# Patient Record
Sex: Male | Born: 1960 | Race: Black or African American | Hispanic: No | Smoking: Never smoker
Health system: Southern US, Community
[De-identification: ages and names within clinical notes are randomized; demographics above are authoritative.]

## PROBLEM LIST (undated history)

## (undated) DIAGNOSIS — J45909 Unspecified asthma, uncomplicated: Secondary | ICD-10-CM

---

## 2021-01-23 ENCOUNTER — Inpatient Hospital Stay (HOSPITAL_COMMUNITY)
Admission: EM | Admit: 2021-01-23 | Discharge: 2021-01-28 | DRG: 065 | Disposition: A | Discharge status: Home / Self Care | Payer: Self-pay | Attending: Family Medicine | Admitting: Family Medicine

## 2021-01-23 ENCOUNTER — Inpatient Hospital Stay (HOSPITAL_COMMUNITY): Payer: Self-pay

## 2021-01-23 ENCOUNTER — Other Ambulatory Visit: Payer: Self-pay

## 2021-01-23 ENCOUNTER — Emergency Department (HOSPITAL_COMMUNITY): Payer: Self-pay

## 2021-01-23 DIAGNOSIS — E785 Hyperlipidemia, unspecified: Secondary | ICD-10-CM | POA: Diagnosis present

## 2021-01-23 DIAGNOSIS — I255 Ischemic cardiomyopathy: Secondary | ICD-10-CM | POA: Diagnosis present

## 2021-01-23 DIAGNOSIS — I63412 Cerebral infarction due to embolism of left middle cerebral artery: Principal | ICD-10-CM | POA: Diagnosis present

## 2021-01-23 DIAGNOSIS — R29810 Facial weakness: Secondary | ICD-10-CM | POA: Diagnosis present

## 2021-01-23 DIAGNOSIS — J45909 Unspecified asthma, uncomplicated: Secondary | ICD-10-CM | POA: Diagnosis present

## 2021-01-23 DIAGNOSIS — I2510 Atherosclerotic heart disease of native coronary artery without angina pectoris: Secondary | ICD-10-CM | POA: Diagnosis present

## 2021-01-23 DIAGNOSIS — I6389 Other cerebral infarction: Secondary | ICD-10-CM

## 2021-01-23 DIAGNOSIS — R29712 NIHSS score 12: Secondary | ICD-10-CM | POA: Diagnosis present

## 2021-01-23 DIAGNOSIS — I10 Essential (primary) hypertension: Secondary | ICD-10-CM | POA: Diagnosis present

## 2021-01-23 DIAGNOSIS — R54 Age-related physical debility: Secondary | ICD-10-CM | POA: Diagnosis present

## 2021-01-23 DIAGNOSIS — I110 Hypertensive heart disease with heart failure: Secondary | ICD-10-CM | POA: Diagnosis present

## 2021-01-23 DIAGNOSIS — Z91018 Allergy to other foods: Secondary | ICD-10-CM

## 2021-01-23 DIAGNOSIS — D72819 Decreased white blood cell count, unspecified: Secondary | ICD-10-CM

## 2021-01-23 DIAGNOSIS — F1020 Alcohol dependence, uncomplicated: Secondary | ICD-10-CM | POA: Diagnosis present

## 2021-01-23 DIAGNOSIS — R471 Dysarthria and anarthria: Secondary | ICD-10-CM | POA: Diagnosis present

## 2021-01-23 DIAGNOSIS — I5040 Unspecified combined systolic (congestive) and diastolic (congestive) heart failure: Secondary | ICD-10-CM | POA: Diagnosis present

## 2021-01-23 DIAGNOSIS — R739 Hyperglycemia, unspecified: Secondary | ICD-10-CM | POA: Diagnosis present

## 2021-01-23 DIAGNOSIS — I639 Cerebral infarction, unspecified: Secondary | ICD-10-CM | POA: Diagnosis present

## 2021-01-23 DIAGNOSIS — I252 Old myocardial infarction: Secondary | ICD-10-CM

## 2021-01-23 DIAGNOSIS — R030 Elevated blood-pressure reading, without diagnosis of hypertension: Secondary | ICD-10-CM

## 2021-01-23 DIAGNOSIS — F1010 Alcohol abuse, uncomplicated: Secondary | ICD-10-CM | POA: Diagnosis present

## 2021-01-23 DIAGNOSIS — I5042 Chronic combined systolic (congestive) and diastolic (congestive) heart failure: Secondary | ICD-10-CM | POA: Diagnosis present

## 2021-01-23 DIAGNOSIS — E871 Hypo-osmolality and hyponatremia: Secondary | ICD-10-CM | POA: Diagnosis present

## 2021-01-23 DIAGNOSIS — I700 Atherosclerosis of aorta: Secondary | ICD-10-CM | POA: Diagnosis present

## 2021-01-23 DIAGNOSIS — Z20822 Contact with and (suspected) exposure to covid-19: Secondary | ICD-10-CM | POA: Diagnosis present

## 2021-01-23 DIAGNOSIS — I513 Intracardiac thrombosis, not elsewhere classified: Secondary | ICD-10-CM | POA: Diagnosis present

## 2021-01-23 DIAGNOSIS — Y902 Blood alcohol level of 40-59 mg/100 ml: Secondary | ICD-10-CM | POA: Diagnosis present

## 2021-01-23 DIAGNOSIS — Z9119 Patient's noncompliance with other medical treatment and regimen: Secondary | ICD-10-CM

## 2021-01-23 DIAGNOSIS — R4701 Aphasia: Secondary | ICD-10-CM | POA: Diagnosis present

## 2021-01-23 HISTORY — DX: Unspecified asthma, uncomplicated: J45.909

## 2021-01-23 LAB — DIFFERENTIAL
Abs Immature Granulocytes: 0.01 10*3/uL (ref 0.00–0.07)
Basophils Absolute: 0 10*3/uL (ref 0.0–0.1)
Basophils Relative: 1 %
Eosinophils Absolute: 0 10*3/uL (ref 0.0–0.5)
Eosinophils Relative: 1 %
Immature Granulocytes: 0 %
Lymphocytes Relative: 22 %
Lymphs Abs: 0.7 10*3/uL (ref 0.7–4.0)
Monocytes Absolute: 0.4 10*3/uL (ref 0.1–1.0)
Monocytes Relative: 13 %
Neutro Abs: 2.1 10*3/uL (ref 1.7–7.7)
Neutrophils Relative %: 63 %

## 2021-01-23 LAB — CBC
HCT: 44.3 % (ref 39.0–52.0)
Hemoglobin: 14.5 g/dL (ref 13.0–17.0)
MCH: 25.8 pg — ABNORMAL LOW (ref 26.0–34.0)
MCHC: 32.7 g/dL (ref 30.0–36.0)
MCV: 79 fL — ABNORMAL LOW (ref 80.0–100.0)
Platelets: 154 10*3/uL (ref 150–400)
RBC: 5.61 MIL/uL (ref 4.22–5.81)
RDW: 14.8 % (ref 11.5–15.5)
WBC: 3.3 10*3/uL — ABNORMAL LOW (ref 4.0–10.5)
nRBC: 0 % (ref 0.0–0.2)

## 2021-01-23 LAB — COMPREHENSIVE METABOLIC PANEL
ALT: 18 U/L (ref 0–44)
AST: 34 U/L (ref 15–41)
Albumin: 3.6 g/dL (ref 3.5–5.0)
Alkaline Phosphatase: 71 U/L (ref 38–126)
Anion gap: 13 (ref 5–15)
BUN: 14 mg/dL (ref 6–20)
CO2: 20 mmol/L — ABNORMAL LOW (ref 22–32)
Calcium: 8.8 mg/dL — ABNORMAL LOW (ref 8.9–10.3)
Chloride: 100 mmol/L (ref 98–111)
Creatinine, Ser: 1.09 mg/dL (ref 0.61–1.24)
GFR, Estimated: >60 mL/min (ref >60)
Glucose, Bld: 116 mg/dL — ABNORMAL HIGH (ref 70–99)
Potassium: 4.1 mmol/L (ref 3.5–5.1)
Sodium: 133 mmol/L — ABNORMAL LOW (ref 135–145)
Total Bilirubin: 0.3 mg/dL (ref 0.3–1.2)
Total Protein: 7.7 g/dL (ref 6.5–8.1)

## 2021-01-23 LAB — RESP PANEL BY RT-PCR (FLU A&B, COVID) ARPGX2
Influenza A by PCR: NEGATIVE
Influenza B by PCR: NEGATIVE
SARS Coronavirus 2 by RT PCR: NEGATIVE

## 2021-01-23 LAB — LIPID PANEL
Cholesterol: 166 mg/dL (ref 0–200)
HDL: 68 mg/dL (ref >40)
LDL Cholesterol: 73 mg/dL (ref 0–99)
Total CHOL/HDL Ratio: 2.4 RATIO
Triglycerides: 123 mg/dL (ref <150)
VLDL: 25 mg/dL (ref 0–40)

## 2021-01-23 LAB — ECHOCARDIOGRAM COMPLETE
S' Lateral: 5.5 cm
Single Plane A4C EF: 21.5 %
Weight: 2130.53 oz

## 2021-01-23 LAB — APTT: aPTT: 25 seconds (ref 24–36)

## 2021-01-23 LAB — URINALYSIS, ROUTINE W REFLEX MICROSCOPIC
Bacteria, UA: NONE SEEN
Bilirubin Urine: NEGATIVE
Glucose, UA: NEGATIVE mg/dL
Ketones, ur: NEGATIVE mg/dL
Leukocytes,Ua: NEGATIVE
Nitrite: NEGATIVE
Protein, ur: NEGATIVE mg/dL
Specific Gravity, Urine: 1.025 (ref 1.005–1.030)
pH: 6 (ref 5.0–8.0)

## 2021-01-23 LAB — I-STAT CHEM 8, ED
BUN: 16 mg/dL (ref 6–20)
Calcium, Ion: 1.02 mmol/L — ABNORMAL LOW (ref 1.15–1.40)
Chloride: 103 mmol/L (ref 98–111)
Creatinine, Ser: 1 mg/dL (ref 0.61–1.24)
Glucose, Bld: 112 mg/dL — ABNORMAL HIGH (ref 70–99)
HCT: 50 % (ref 39.0–52.0)
Hemoglobin: 17 g/dL (ref 13.0–17.0)
Potassium: 4.1 mmol/L (ref 3.5–5.1)
Sodium: 135 mmol/L (ref 135–145)
TCO2: 20 mmol/L — ABNORMAL LOW (ref 22–32)

## 2021-01-23 LAB — VITAMIN B12: Vitamin B-12: 157 pg/mL — ABNORMAL LOW (ref 180–914)

## 2021-01-23 LAB — RAPID URINE DRUG SCREEN, HOSP PERFORMED
Amphetamines: NOT DETECTED
Barbiturates: NOT DETECTED
Benzodiazepines: NOT DETECTED
Cocaine: NOT DETECTED
Opiates: NOT DETECTED
Tetrahydrocannabinol: NOT DETECTED

## 2021-01-23 LAB — ETHANOL: Alcohol, Ethyl (B): 42 mg/dL — ABNORMAL HIGH (ref <10)

## 2021-01-23 LAB — PROTIME-INR
INR: 1.1 (ref 0.8–1.2)
Prothrombin Time: 14.2 seconds (ref 11.4–15.2)

## 2021-01-23 LAB — HIV ANTIBODY (ROUTINE TESTING W REFLEX): HIV Screen 4th Generation wRfx: NONREACTIVE

## 2021-01-23 LAB — HEMOGLOBIN A1C
Hgb A1c MFr Bld: 5.6 % (ref 4.8–5.6)
Mean Plasma Glucose: 114.02 mg/dL

## 2021-01-23 LAB — PHOSPHORUS: Phosphorus: 3.2 mg/dL (ref 2.5–4.6)

## 2021-01-23 LAB — TSH: TSH: 1.221 u[IU]/mL (ref 0.350–4.500)

## 2021-01-23 LAB — MAGNESIUM: Magnesium: 1.5 mg/dL — ABNORMAL LOW (ref 1.7–2.4)

## 2021-01-23 LAB — CBG MONITORING, ED: Glucose-Capillary: 117 mg/dL — ABNORMAL HIGH (ref 70–99)

## 2021-01-23 IMAGING — CT CT HEAD CODE STROKE
4 series · 16 of 47 positions shown, 18 images · non-contrast
Comparison: None.

CLINICAL DATA: Code stroke.  Aphasia and right facial droop.

EXAM:
CT HEAD WITHOUT CONTRAST
TECHNIQUE: Contiguous axial images were obtained from the base of the skull
through the vertex without intravenous contrast.

[Series 2: head 5.0 st · axial · 0.42mm/px · z∈[-133,-13]mm · 7 of 34 slices shown, 9 images]
[im 5/34  brain]
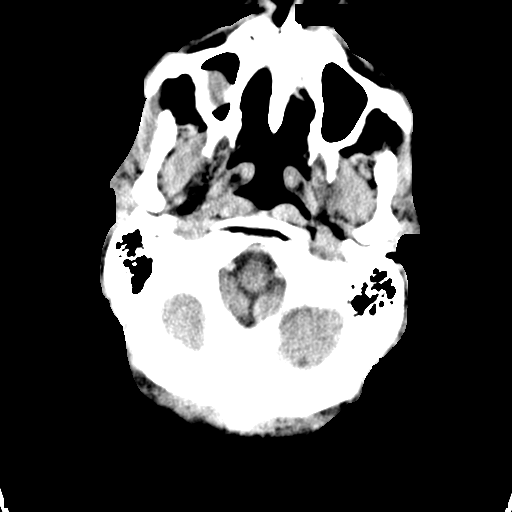
[im 5/34  bone]
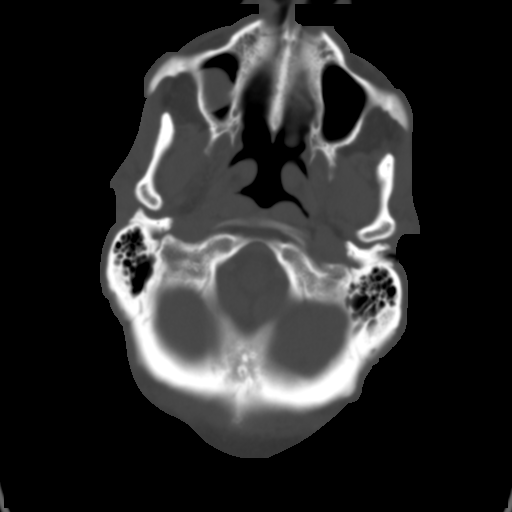
[im 9/34  brain]
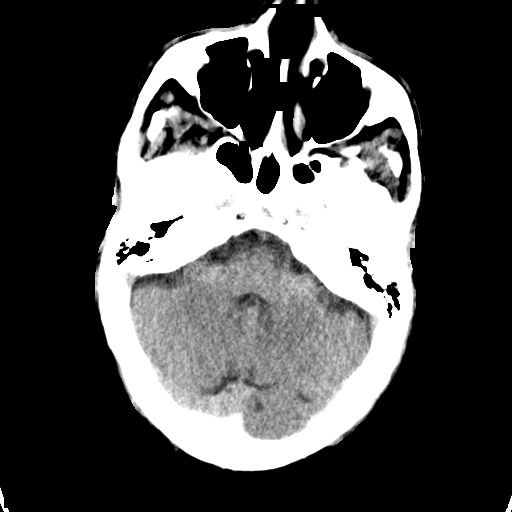
[im 13/34  brain]
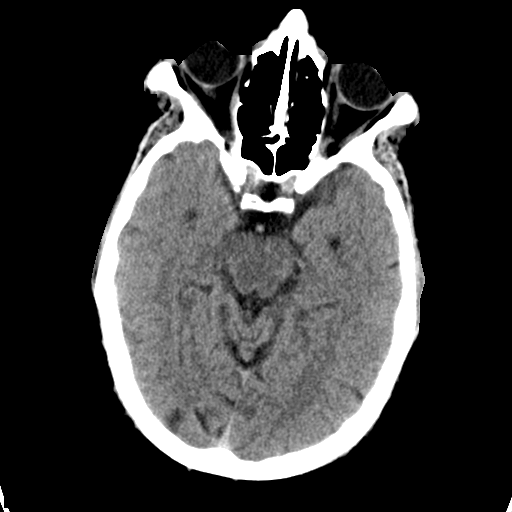
[im 17/34  brain]
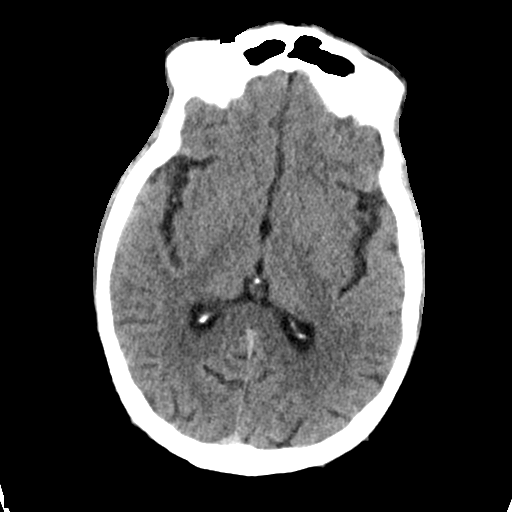
[im 21/34  brain]
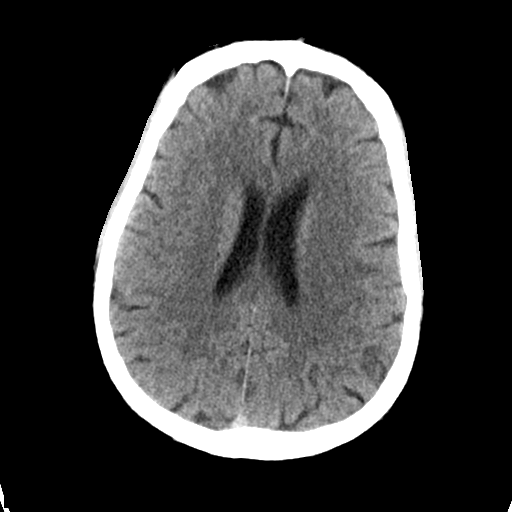
[im 21/34  bone]
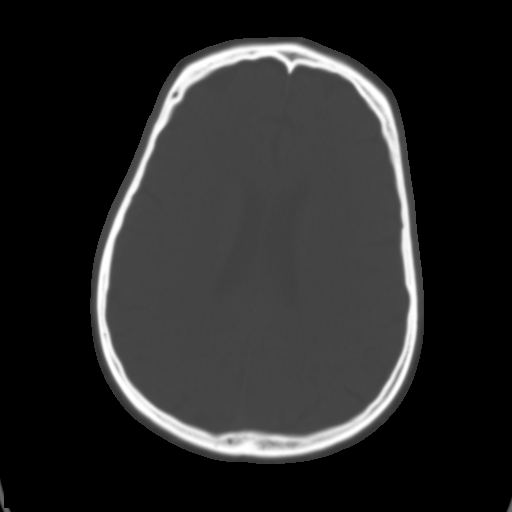
[im 25/34  brain]
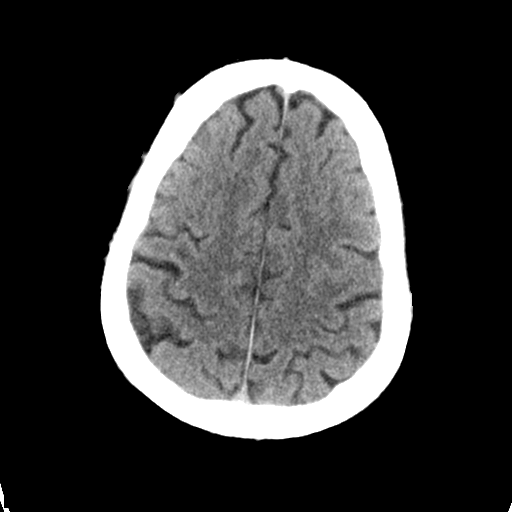
[im 29/34  brain]
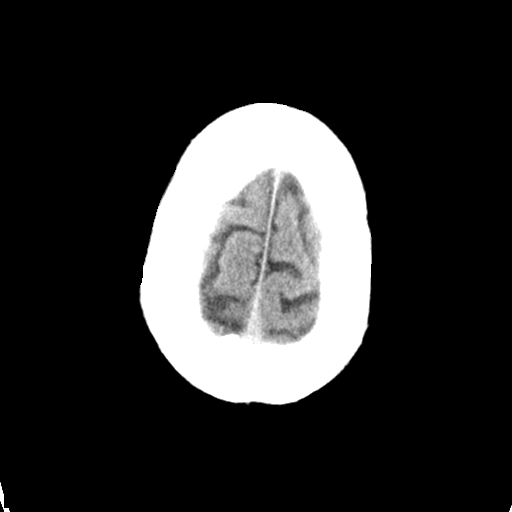

[Series 3: head 2.0 bone · axial · 0.42mm/px · z∈[-137,-103]mm · 3 of 85 slices shown]
[im 9/85  bone]
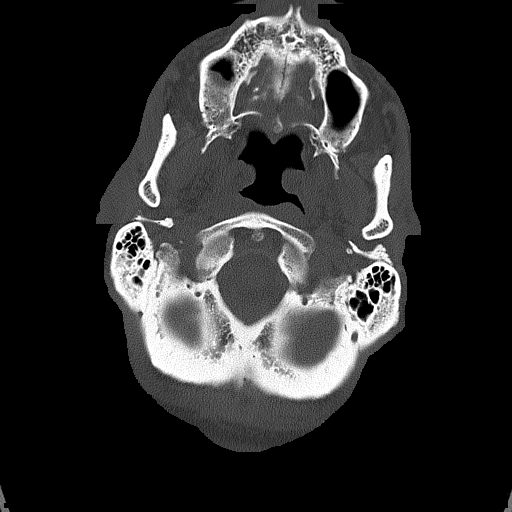
[im 17/85  bone]
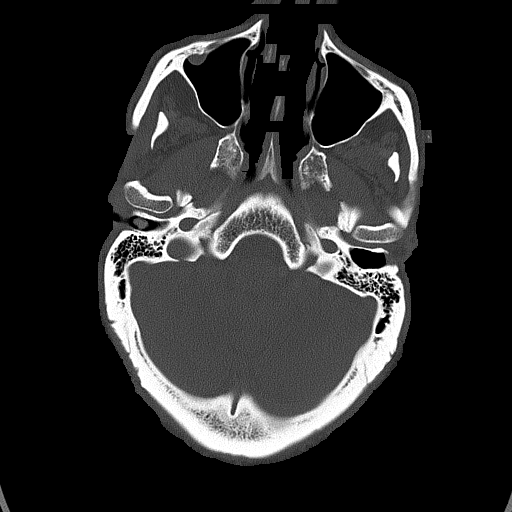
[im 26/85  bone]
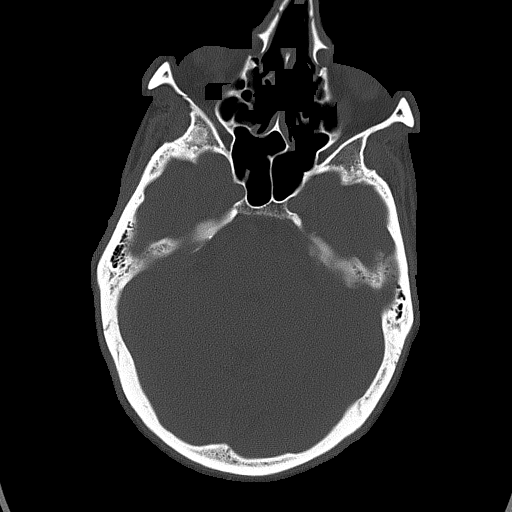

[Series 5: head 3.0 cor st · coronal · 0.33mm/px · 3 of 74 slices shown]
[im 25/74  brain]
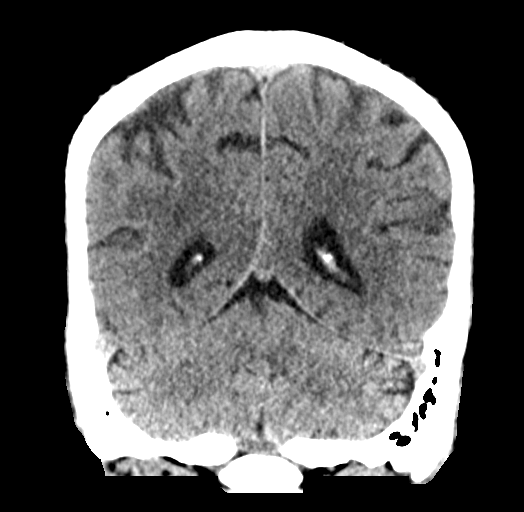
[im 33/74  brain]
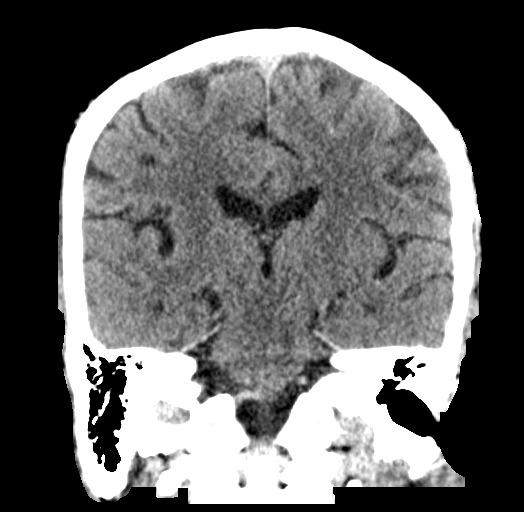
[im 41/74  brain]
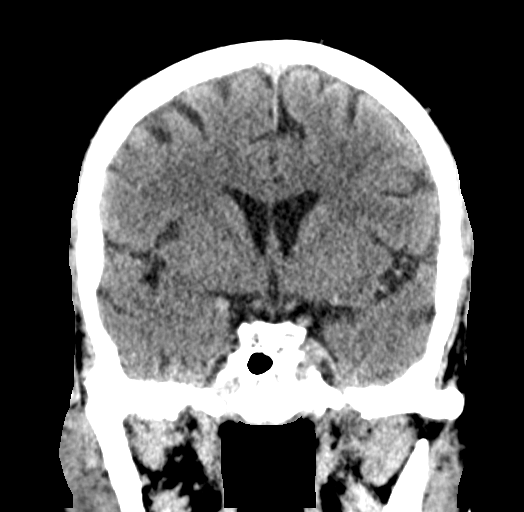

[Series 6: head 3.0 sag st · sagittal · 0.33mm/px · 3 of 60 slices shown]
[im 20/60  brain]
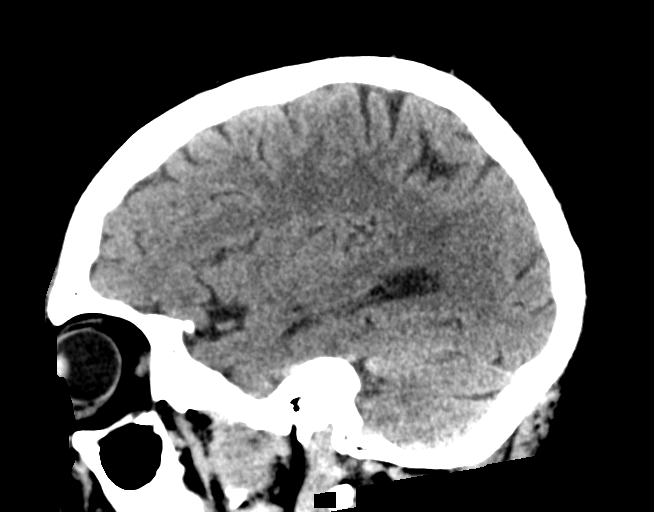
[im 30/60  brain]
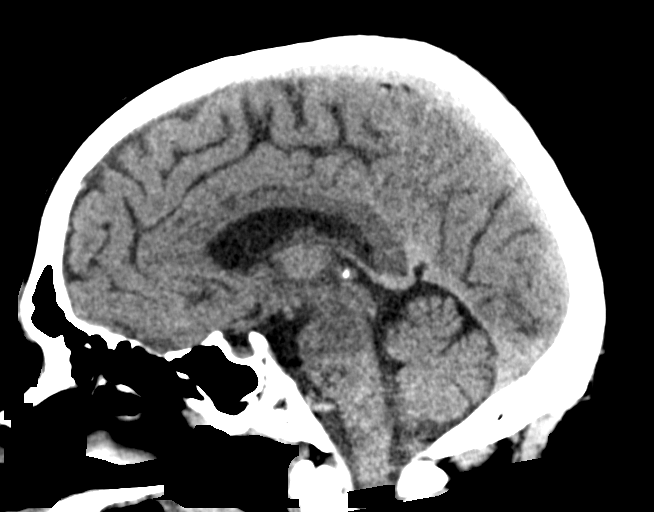
[im 40/60  brain]
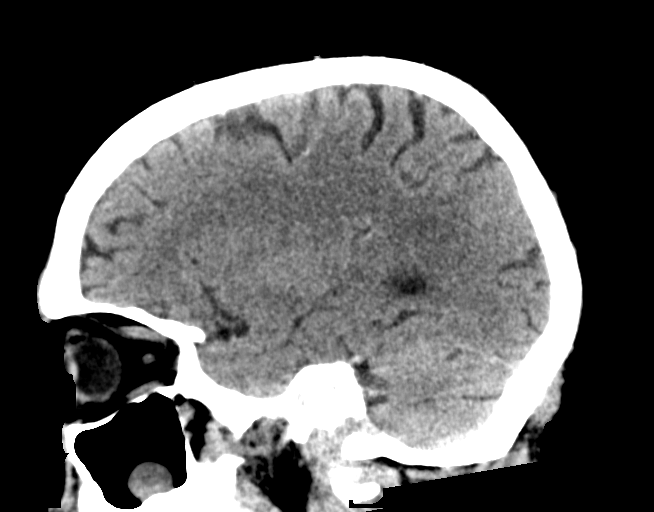

[16 of 47 positions shown; findings below may reference images not displayed]

FINDINGS: Brain: Small area of gray-white matter differentiation loss along
the high and posterior left frontal cortex. No acute hemorrhage,
hydrocephalus, or masslike finding. Small remote left cerebellar
infarct.

Vascular: No definite hyperdense vessel in all planes.

Skull: Normal. Negative for fracture or focal lesion.

Sinuses/Orbits: Gaze to the left.

Other: These results were communicated to Dr. LIAMSI at [DATE] LIAMSI
[DATE]by text page via the [employer] messaging system.

ASPECTS (Alberta Stroke Program Early CT Score)

- Ganglionic level infarction (caudate, lentiform nuclei, internal
capsule, insula, M1-M3 cortex): 7

- Supraganglionic infarction (M4-M6 cortex): 2

Total score (0-10 with 10 being normal): 9
IMPRESSION: 1. Small acute cortical infarct in the left frontal region.ASPECTS
is 9.
2. No acute hemorrhage.
3. Small remote left cerebellar infarct.

## 2021-01-23 IMAGING — MR MR HEAD W/O CM
13 of 14 series · 44 of 48 positions shown · non-contrast
Comparison: CT and CTA with CT perfusion from earlier today

CLINICAL DATA: Acute stroke.

EXAM:
MRI HEAD WITHOUT CONTRAST
TECHNIQUE: Multiplanar, multiecho pulse sequences of the brain and surrounding
structures were obtained without intravenous contrast.

[Series 5: DWI · axial · 3.0mm · 0.88mm/px · z∈[-75,+65]mm · 6 of 96 slices shown (1 of 4)]
[im 1/96]
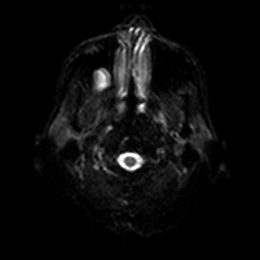
[im 20/96]
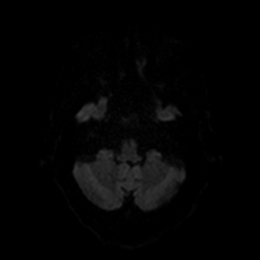
[im 39/96]
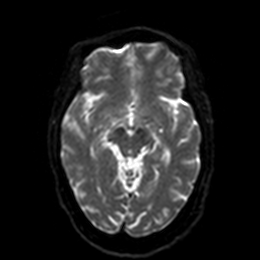
[im 58/96]
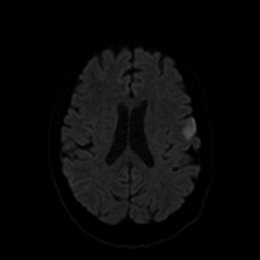
[im 77/96]
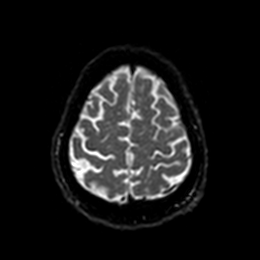
[im 96/96]
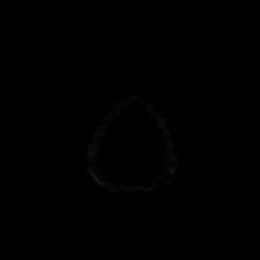

[Series 6: DWI · axial · 3.0mm · 0.88mm/px · z∈[-75,+65]mm · 4 of 48 slices shown (2 of 4)]
[im 1/48]
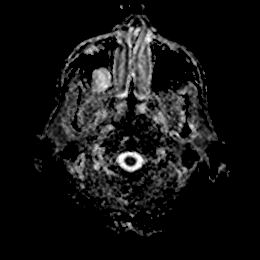
[im 16/48]
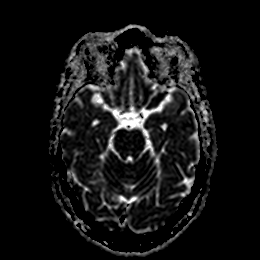
[im 32/48]
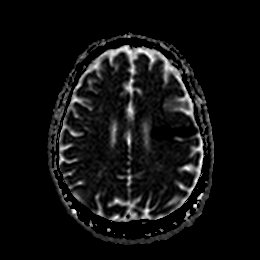
[im 48/48]
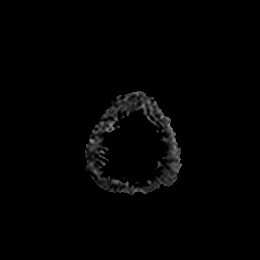

[Series 7: DWI · coronal · 4.0mm · 0.88mm/px · 5 of 68 slices shown (3 of 4)]
[im 1/68]
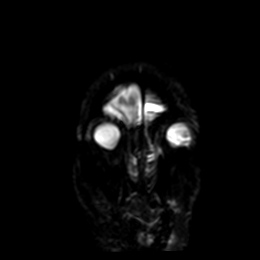
[im 17/68]
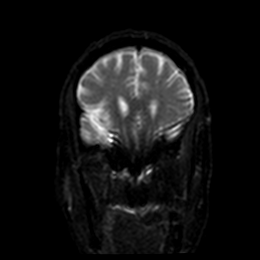
[im 34/68]
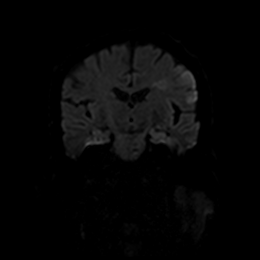
[im 51/68]
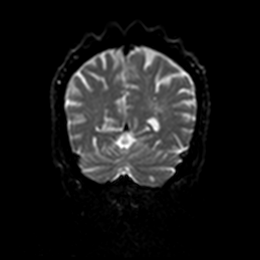
[im 68/68]
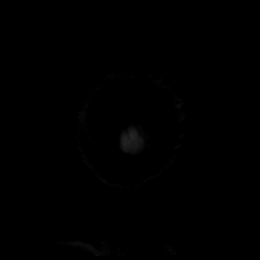

[Series 8: DWI · coronal · 4.0mm · 0.88mm/px · 3 of 34 slices shown (4 of 4)]
[im 1/34]
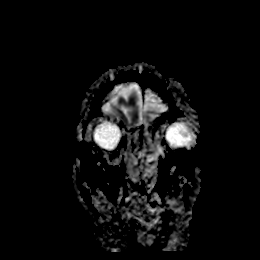
[im 17/34]
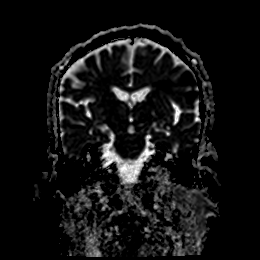
[im 34/34]
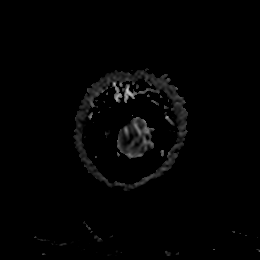

[Series 9: FLAIR · axial · 5.0mm · 0.45mm/px · z∈[-87,+57]mm · 2 of 25 slices shown (1 of 2)]
[im 1/25]
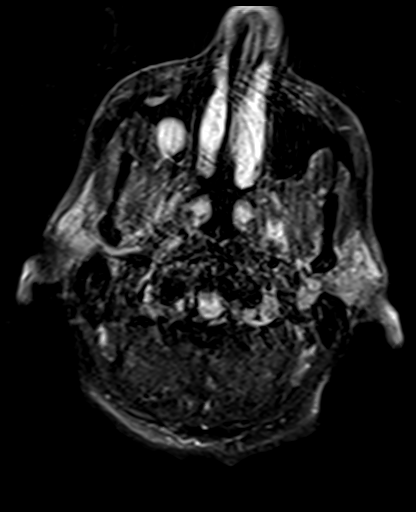
[im 25/25]
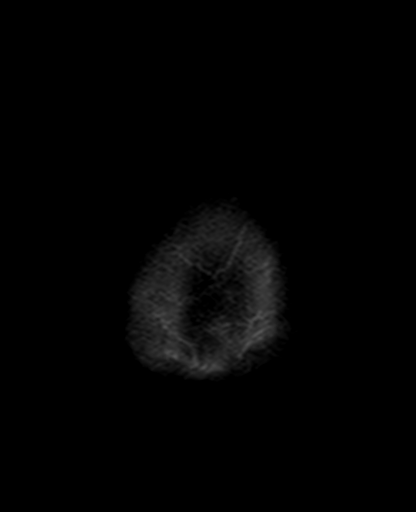

[Series 10: mag_images · axial · 3.0mm · 0.90mm/px · z∈[-97,+68]mm · 4 of 56 slices shown]
[im 1/56]
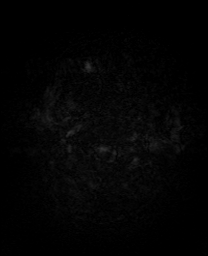
[im 19/56]
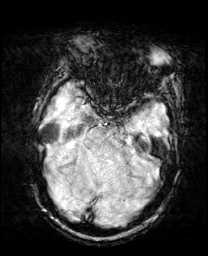
[im 37/56]
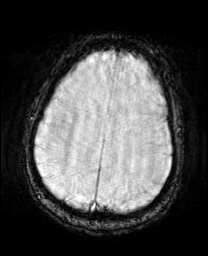
[im 56/56]
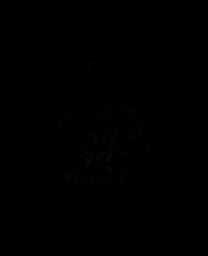

[Series 11: pha_images · axial · 3.0mm · 0.90mm/px · z∈[-91,+68]mm · 4 of 53 slices shown]
[im 1/53]
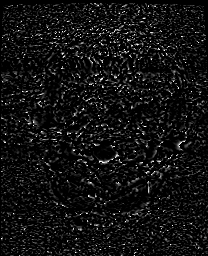
[im 18/53]
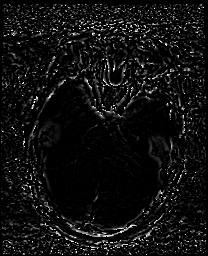
[im 35/53]
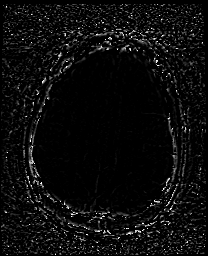
[im 53/53]
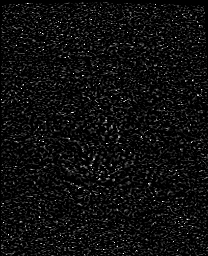

[Series 12: swi_images · axial · 3.0mm · 0.90mm/px · z∈[-97,+68]mm · 4 of 56 slices shown]
[im 1/56]
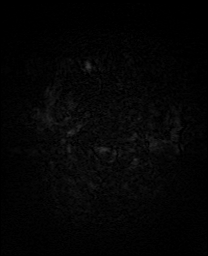
[im 19/56]
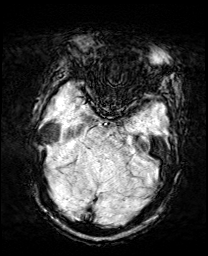
[im 37/56]
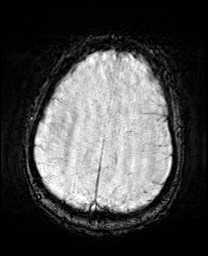
[im 56/56]
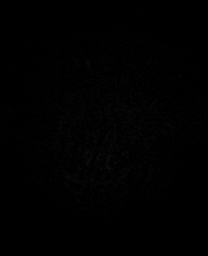

[Series 13: mip_images(sw) · axial · 24.0mm · 0.90mm/px · z∈[-87,+57]mm · 4 of 49 slices shown]
[im 1/49]
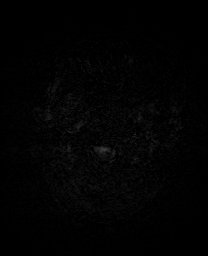
[im 17/49]
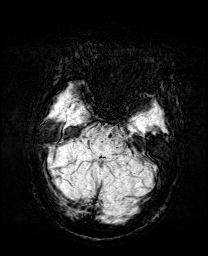
[im 33/49]
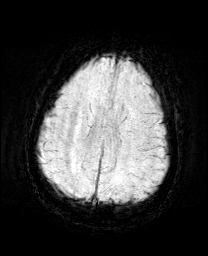
[im 49/49]
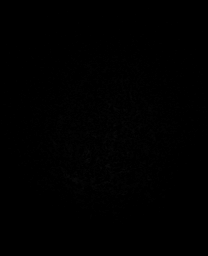

[Series 14: T2 · axial · 5.0mm · 0.72mm/px · z∈[-86,+58]mm · 2 of 25 slices shown (1 of 2)]
[im 1/25]
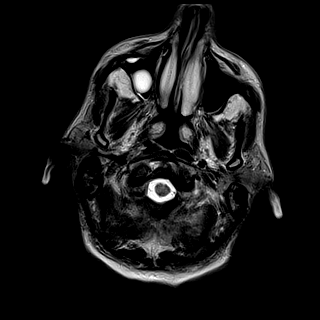
[im 25/25]
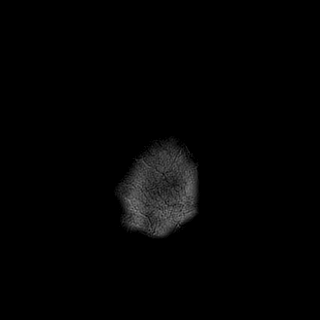

[Series 15: T1 · sagittal · 5.0mm · 0.75mm/px · 2 of 23 slices shown]
[im 1/23]
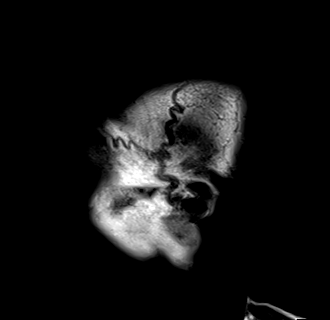
[im 23/23]
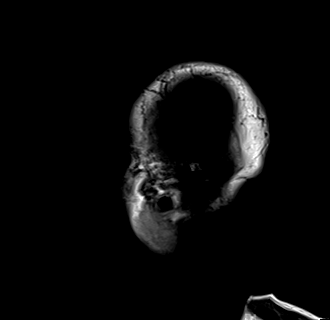

[Series 17: T2 · coronal · 5.0mm · 0.34mm/px · 2 of 30 slices shown (2 of 2)]
[im 1/30]
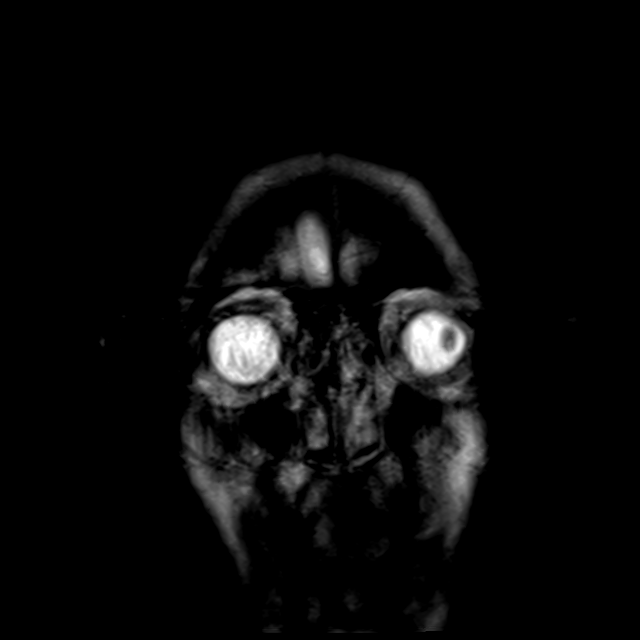
[im 30/30]
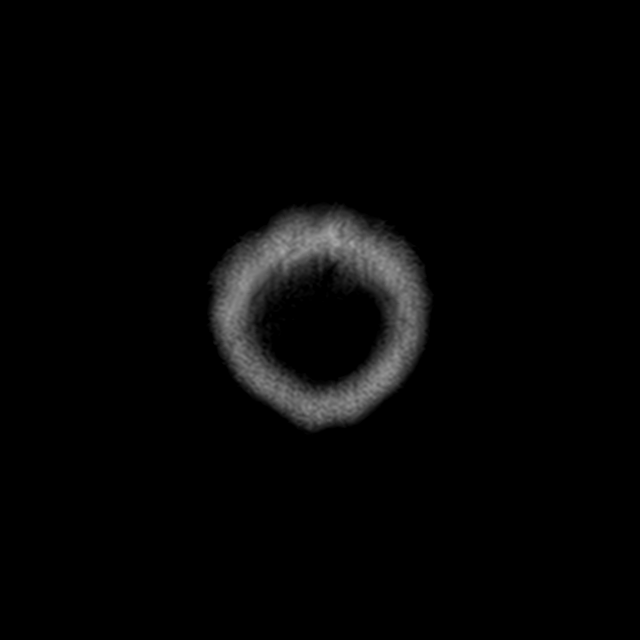

[Series 18: FLAIR · axial · 5.0mm · 0.90mm/px · z∈[-86,+58]mm · 2 of 25 slices shown (2 of 2)]
[im 1/25]
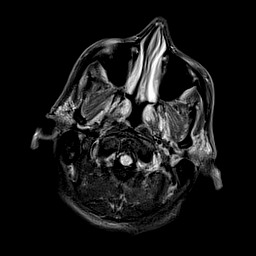
[im 25/25]
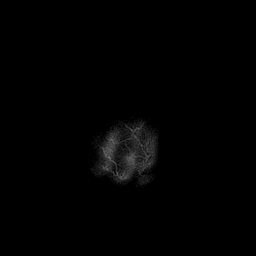

[44 of 48 positions shown; findings below may reference images not displayed]

FINDINGS: Brain: Restricted diffusion in the posterior and lateral left
frontal cortex and subjacent white matter. This is the area that was
positive on noncontrast head CT. Less prominent FLAIR signal in this
area but there are is early detectable swelling and cortical
hyperintensity on a few slices. Two subcentimeter areas are seen at
the MCA watershed zone at the frontal and occipital lobes, too small
for FLAIR correlation on this motion degraded scan.

Background chronic small vessel ischemia. Small remote left inferior
cerebellar infarct. Remote micro hemorrhages in the right thalamus
and right cerebellum.

Vascular: Normal flow voids.

Skull and upper cervical spine: Negative

Sinuses/Orbits: Retention cysts in the left maxillary sinus.

Other: Progressively motion degraded.
IMPRESSION: 1. Band of restricted diffusion in the lateral left frontal lobe.
FLAIR changes are less apparent but this area has the appearance of
completed infarct by CT.
2. Smaller areas of altered diffusion at the border zone of the left
MCA territory.
3. Background chronic small vessel ischemia.
4. Progressively motion degraded study.

## 2021-01-23 MED ORDER — CLOPIDOGREL BISULFATE 75 MG PO TABS
75.0000 mg | ORAL_TABLET | Freq: Every day | ORAL | Status: DC
  Administered 2021-01-24 – 2021-01-27 (×4): 75 mg via ORAL
  Filled 2021-01-23 (×5): qty 1

## 2021-01-23 MED ORDER — CLOPIDOGREL BISULFATE 300 MG PO TABS
300.0000 mg | ORAL_TABLET | Freq: Once | ORAL | Status: AC
  Administered 2021-01-23: 300 mg via ORAL
  Filled 2021-01-23: qty 1

## 2021-01-23 MED ORDER — SODIUM CHLORIDE 0.9 % IV SOLN
250.0000 mL | INTRAVENOUS | Status: DC | PRN
  Administered 2021-01-23 – 2021-01-24 (×2): 250 mL via INTRAVENOUS

## 2021-01-23 MED ORDER — STROKE: EARLY STAGES OF RECOVERY BOOK
Freq: Once | Status: AC
  Filled 2021-01-23: qty 1

## 2021-01-23 MED ORDER — THIAMINE HCL 100 MG/ML IJ SOLN: 100.0000 mg | Freq: Every day | INTRAMUSCULAR | Status: DC

## 2021-01-23 MED ORDER — ASPIRIN 325 MG PO TABS
ORAL_TABLET | ORAL | Status: AC
  Filled 2021-01-23: qty 1

## 2021-01-23 MED ORDER — THIAMINE HCL 100 MG/ML IJ SOLN
500.0000 mg | Freq: Three times a day (TID) | INTRAVENOUS | Status: AC
  Administered 2021-01-23 – 2021-01-25 (×9): 500 mg via INTRAVENOUS
  Filled 2021-01-23 (×12): qty 5

## 2021-01-23 MED ORDER — SODIUM CHLORIDE 0.9% FLUSH: 3.0000 mL | INTRAVENOUS | Status: DC | PRN

## 2021-01-23 MED ORDER — FOLIC ACID 1 MG PO TABS
1.0000 mg | ORAL_TABLET | Freq: Every day | ORAL | Status: DC
  Administered 2021-01-23 – 2021-01-28 (×6): 1 mg via ORAL
  Filled 2021-01-23 (×6): qty 1

## 2021-01-23 MED ORDER — ASPIRIN 300 MG RE SUPP: 300.0000 mg | Freq: Once | RECTAL | Status: AC

## 2021-01-23 MED ORDER — LORAZEPAM 1 MG PO TABS: 1.0000 mg | ORAL_TABLET | ORAL | Status: DC | PRN

## 2021-01-23 MED ORDER — SODIUM CHLORIDE 0.9% FLUSH
3.0000 mL | Freq: Two times a day (BID) | INTRAVENOUS | Status: DC
  Administered 2021-01-23 – 2021-01-26 (×8): 3 mL via INTRAVENOUS

## 2021-01-23 MED ORDER — IOHEXOL 350 MG/ML SOLN
100.0000 mL | Freq: Once | INTRAVENOUS | Status: AC | PRN
  Administered 2021-01-23: 100 mL via INTRAVENOUS

## 2021-01-23 MED ORDER — ACETAMINOPHEN 650 MG RE SUPP: 650.0000 mg | RECTAL | Status: DC | PRN

## 2021-01-23 MED ORDER — LORAZEPAM 2 MG/ML IJ SOLN: 1.0000 mg | INTRAMUSCULAR | Status: DC | PRN

## 2021-01-23 MED ORDER — ASPIRIN 325 MG PO TABS
325.0000 mg | ORAL_TABLET | Freq: Once | ORAL | Status: AC
  Administered 2021-01-23: 325 mg via ORAL
  Filled 2021-01-23: qty 1

## 2021-01-23 MED ORDER — ACETAMINOPHEN 325 MG PO TABS: 650.0000 mg | ORAL_TABLET | ORAL | Status: DC | PRN

## 2021-01-23 MED ORDER — LABETALOL HCL 5 MG/ML IV SOLN: 10.0000 mg | INTRAVENOUS | Status: DC | PRN

## 2021-01-23 MED ORDER — ACETAMINOPHEN 160 MG/5ML PO SOLN: 650.0000 mg | ORAL | Status: DC | PRN

## 2021-01-23 MED ORDER — THIAMINE HCL 100 MG/ML IJ SOLN
100.0000 mg | Freq: Every day | INTRAMUSCULAR | Status: DC
  Filled 2021-01-23 (×2): qty 2

## 2021-01-23 MED ORDER — ADULT MULTIVITAMIN W/MINERALS CH
1.0000 | ORAL_TABLET | Freq: Every day | ORAL | Status: DC
  Administered 2021-01-23 – 2021-01-28 (×6): 1 via ORAL
  Filled 2021-01-23 (×6): qty 1

## 2021-01-23 MED ORDER — THIAMINE HCL 100 MG PO TABS: 100.0000 mg | ORAL_TABLET | Freq: Every day | ORAL | Status: DC

## 2021-01-23 NOTE — ED Provider Notes (Signed)
[facility] EMERGENCY DEPARTMENT Provider Note   CSN: [id number] Arrival date & time: 01/23/21  1194   History Chief Complaint  Patient presents with  . Code Stroke    Todd Vega is a 60 y.o. male.  The history is provided by the patient, the EMS personnel and medical records.  He was brought in by EMS as a code stroke.  He noted at 2 AM that he was unable to speak.  EMS noted right facial droop.  Patient denies headache.  No past medical history on file.  There are no problems to display for this patient.   ** The histories are not reviewed yet. Please review them in the "History" navigator section and refresh this SmartLink.     No family history on file.     Home Medications Prior to Admission medications   Not on File    Allergies    Patient has no known allergies.  Review of Systems   Review of Systems  All other systems reviewed and are negative.   Physical Exam Updated Vital Signs BP (!) 169/116 (BP Location: Right Wrist)   Pulse (!) 104   Temp 98 F (36.7 C)   Resp 20   Wt 60.4 kg   SpO2 99%   Physical Exam Vitals and nursing note reviewed.   60 year old male, resting comfortably and in no acute distress. Vital signs are significant for elevated blood pressure and borderline elevated heart rate. Oxygen saturation is 99%, which is normal. Head is normocephalic and atraumatic. PERRLA, EOMI. Oropharynx is clear. Neck is nontender and supple without adenopathy or JVD. Back is nontender and there is no CVA tenderness. Lungs are clear without rales, wheezes, or rhonchi. Chest is nontender. Heart has regular rate and rhythm without murmur. Abdomen is soft, flat, nontender without masses or hepatosplenomegaly and peristalsis is normoactive. Extremities have no cyanosis or edema, full range of motion is present. Skin is warm and dry without rash. Neurologic: Awake and alert, nonverbal but understands questions and is able to follow  commands appropriately.  Right central facial droop present, tongue protrudes slightly to the right of midline.  Arm and leg strength is 5/5 bilaterally, no pronator drift.   ED Results / Procedures / Treatments   Labs (all labs ordered are listed, but only abnormal results are displayed) Labs Reviewed  ETHANOL - Abnormal; Notable for the following components:      Result Value   Alcohol, Ethyl (B) 42 (*)    All other components within normal limits  CBC - Abnormal; Notable for the following components:   WBC 3.3 (*)    MCV 79.0 (*)    MCH 25.8 (*)    All other components within normal limits  COMPREHENSIVE METABOLIC PANEL - Abnormal; Notable for the following components:   Sodium 133 (*)    CO2 20 (*)    Glucose, Bld 116 (*)    Calcium 8.8 (*)    All other components within normal limits  I-STAT CHEM 8, ED - Abnormal; Notable for the following components:   Glucose, Bld 112 (*)    Calcium, Ion 1.02 (*)    TCO2 20 (*)    All other components within normal limits  CBG MONITORING, ED - Abnormal; Notable for the following components:   Glucose-Capillary 117 (*)    All other components within normal limits  RESP PANEL BY RT-PCR (FLU A&B, COVID) ARPGX2  PROTIME-INR  APTT  DIFFERENTIAL  RAPID URINE DRUG  SCREEN, HOSP PERFORMED  URINALYSIS, ROUTINE W REFLEX MICROSCOPIC  VITAMIN B1  VITAMIN B12  LIPID PANEL  HEMOGLOBIN A1C  TSH    EKG EKG Interpretation  Date/Time:  Saturday January 23 2021 06:37:41 EDT Ventricular Rate:  101 PR Interval:  146 QRS Duration: 108 QT Interval:  353 QTC Calculation: 458 R Axis:   -53 Text Interpretation: Sinus tachycardia Probable left atrial enlargement Incomplete left bundle branch block LVH with secondary repolarization abnormality No old tracing to compare Confirmed by Dione Booze ([id number]) on 01/23/2021 6:47:18 AM   Radiology CT ANGIO HEAD W OR WO CONTRAST  Result Date: 01/23/2021 CLINICAL DATA:  Stroke-like symptoms EXAM: CT ANGIOGRAPHY  HEAD AND NECK CT PERFUSION BRAIN TECHNIQUE: Multidetector CT imaging of the head and neck was performed using the standard protocol during bolus administration of intravenous contrast. Multiplanar CT image reconstructions and MIPs were obtained to evaluate the vascular anatomy. Carotid stenosis measurements (when applicable) are obtained utilizing NASCET criteria, using the distal internal carotid diameter as the denominator. Multiphase CT imaging of the brain was performed following IV bolus contrast injection. Subsequent parametric perfusion maps were calculated using RAPID software. CONTRAST:  Dose is currently not known on this in progress study COMPARISON:  Contemporaneous head CT FINDINGS: CTA NECK FINDINGS Aortic arch: No acute finding. Mild atheromatous wall thickening. Three vessel branching. Right carotid system: Vessels are smooth and widely patent with no significant atheromatous changes. Left carotid system: Vessels are smooth and widely patent. Mild atheromatous plaque at the bifurcation. Vertebral arteries: No proximal subclavian stenosis. Codominant vertebral arteries that are smooth and widely patent to the dura. Skeleton: No acute finding. Dental caries with periapical erosions and torus mandibularis. Other neck: Dense calcification in the superficial left parotid. Upper chest: Clustered small pulmonary nodules in the right upper lobe which have an inflammatory pattern. Review of the MIP images confirms the above findings CTA HEAD FINDINGS Anterior circulation: Atheromatous plaque on the carotid siphons. No branch occlusion, beading, or proximal flow limiting stenosis. Posterior circulation: Vertebral and basilar arteries are smooth and widely patent. No proximal branch occlusion, beading, or aneurysm. Venous sinuses: Diffusely patent Anatomic variants: None significant Review of the MIP images confirms the above findings CT Brain Perfusion Findings: ASPECTS: 9 CBF (<30%) Volume: 0mL Perfusion  (Tmax>6.0s) volume: 17mL Mismatch Volume: 17mL IMPRESSION: 1. No emergent large vessel occlusion. 2. 17 cc ischemic area in the lateral left frontal region, some of which is completed infarct by noncontrast CT. 3. Mild atheromatous changes without proximal flow limiting stenosis or embolic source. Electronically Signed   By: Marnee Spring M.D.   On: 01/23/2021 05:59   CT ANGIO NECK W OR WO CONTRAST  Result Date: 01/23/2021 CLINICAL DATA:  Stroke-like symptoms EXAM: CT ANGIOGRAPHY HEAD AND NECK CT PERFUSION BRAIN TECHNIQUE: Multidetector CT imaging of the head and neck was performed using the standard protocol during bolus administration of intravenous contrast. Multiplanar CT image reconstructions and MIPs were obtained to evaluate the vascular anatomy. Carotid stenosis measurements (when applicable) are obtained utilizing NASCET criteria, using the distal internal carotid diameter as the denominator. Multiphase CT imaging of the brain was performed following IV bolus contrast injection. Subsequent parametric perfusion maps were calculated using RAPID software. CONTRAST:  Dose is currently not known on this in progress study COMPARISON:  Contemporaneous head CT FINDINGS: CTA NECK FINDINGS Aortic arch: No acute finding. Mild atheromatous wall thickening. Three vessel branching. Right carotid system: Vessels are smooth and widely patent with no significant atheromatous changes. Left carotid  system: Vessels are smooth and widely patent. Mild atheromatous plaque at the bifurcation. Vertebral arteries: No proximal subclavian stenosis. Codominant vertebral arteries that are smooth and widely patent to the dura. Skeleton: No acute finding. Dental caries with periapical erosions and torus mandibularis. Other neck: Dense calcification in the superficial left parotid. Upper chest: Clustered small pulmonary nodules in the right upper lobe which have an inflammatory pattern. Review of the MIP images confirms the above  findings CTA HEAD FINDINGS Anterior circulation: Atheromatous plaque on the carotid siphons. No branch occlusion, beading, or proximal flow limiting stenosis. Posterior circulation: Vertebral and basilar arteries are smooth and widely patent. No proximal branch occlusion, beading, or aneurysm. Venous sinuses: Diffusely patent Anatomic variants: None significant Review of the MIP images confirms the above findings CT Brain Perfusion Findings: ASPECTS: 9 CBF (<30%) Volume: 1mL Perfusion (Tmax>6.0s) volume: 4mL Mismatch Volume: 104mL IMPRESSION: 1. No emergent large vessel occlusion. 2. 17 cc ischemic area in the lateral left frontal region, some of which is completed infarct by noncontrast CT. 3. Mild atheromatous changes without proximal flow limiting stenosis or embolic source. Electronically Signed   By: Marnee Spring M.D.   On: 01/23/2021 05:59   CT CEREBRAL PERFUSION W CONTRAST  Result Date: 01/23/2021 CLINICAL DATA:  Stroke-like symptoms EXAM: CT ANGIOGRAPHY HEAD AND NECK CT PERFUSION BRAIN TECHNIQUE: Multidetector CT imaging of the head and neck was performed using the standard protocol during bolus administration of intravenous contrast. Multiplanar CT image reconstructions and MIPs were obtained to evaluate the vascular anatomy. Carotid stenosis measurements (when applicable) are obtained utilizing NASCET criteria, using the distal internal carotid diameter as the denominator. Multiphase CT imaging of the brain was performed following IV bolus contrast injection. Subsequent parametric perfusion maps were calculated using RAPID software. CONTRAST:  Dose is currently not known on this in progress study COMPARISON:  Contemporaneous head CT FINDINGS: CTA NECK FINDINGS Aortic arch: No acute finding. Mild atheromatous wall thickening. Three vessel branching. Right carotid system: Vessels are smooth and widely patent with no significant atheromatous changes. Left carotid system: Vessels are smooth and widely  patent. Mild atheromatous plaque at the bifurcation. Vertebral arteries: No proximal subclavian stenosis. Codominant vertebral arteries that are smooth and widely patent to the dura. Skeleton: No acute finding. Dental caries with periapical erosions and torus mandibularis. Other neck: Dense calcification in the superficial left parotid. Upper chest: Clustered small pulmonary nodules in the right upper lobe which have an inflammatory pattern. Review of the MIP images confirms the above findings CTA HEAD FINDINGS Anterior circulation: Atheromatous plaque on the carotid siphons. No branch occlusion, beading, or proximal flow limiting stenosis. Posterior circulation: Vertebral and basilar arteries are smooth and widely patent. No proximal branch occlusion, beading, or aneurysm. Venous sinuses: Diffusely patent Anatomic variants: None significant Review of the MIP images confirms the above findings CT Brain Perfusion Findings: ASPECTS: 9 CBF (<30%) Volume: 10mL Perfusion (Tmax>6.0s) volume: 35mL Mismatch Volume: 31mL IMPRESSION: 1. No emergent large vessel occlusion. 2. 17 cc ischemic area in the lateral left frontal region, some of which is completed infarct by noncontrast CT. 3. Mild atheromatous changes without proximal flow limiting stenosis or embolic source. Electronically Signed   By: Marnee Spring M.D.   On: 01/23/2021 05:59   CT HEAD CODE STROKE WO CONTRAST  Result Date: 01/23/2021 CLINICAL DATA:  Code stroke.  Aphasia and right facial droop. EXAM: CT HEAD WITHOUT CONTRAST TECHNIQUE: Contiguous axial images were obtained from the base of the skull through the vertex without intravenous  contrast. COMPARISON:  None. FINDINGS: Brain: Small area of gray-white matter differentiation loss along the high and posterior left frontal cortex. No acute hemorrhage, hydrocephalus, or masslike finding. Small remote left cerebellar infarct. Vascular: No definite hyperdense vessel in all planes. Skull: Normal. Negative for  fracture or focal lesion. Sinuses/Orbits: Gaze to the left. Other: These results were communicated to Dr. Iver NestleBhagat at 5:43 amon 4/9/2022by text page via the [facility] messaging system. ASPECTS [facility](Alberta Stroke Program Early CT Score) - Ganglionic level infarction (caudate, lentiform nuclei, internal capsule, insula, M1-M3 cortex): 7 - Supraganglionic infarction (M4-M6 cortex): 2 Total score (0-10 with 10 being normal): 9 IMPRESSION: 1. 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 2. No acute hemorrhage. 3. Small remote left cerebellar infarct. Electronically Signed   By: Marnee SpringJonathon  Watts M.D.   On: 01/23/2021 05:44    Procedures Procedures  CRITICAL CARE Performed by: Dione Boozeavid Adah Stoneberg Total critical care time: 45 minutes Critical care time was exclusive of separately billable procedures and treating other patients. Critical care was necessary to treat or prevent imminent or life-threatening deterioration. Critical care was time spent personally by me on the following activities: development of treatment plan with patient and/or surrogate as well as nursing, discussions with consultants, evaluation of patient's response to treatment, examination of patient, obtaining history from patient or surrogate, ordering and performing treatments and interventions, ordering and review of laboratory studies, ordering and review of radiographic studies, pulse oximetry and re-evaluation of patient's condition.  Medications Ordered in ED Medications  labetalol (NORMODYNE) injection 10 mg (has no administration in time range)  thiamine 500mg  in normal saline (50ml) IVPB (has no administration in time range)    Followed by  thiamine (B-1) injection 100 mg (has no administration in time range)  aspirin tablet 325 mg (has no administration in time range)    Or  aspirin suppository 300 mg (has no administration in time range)  clopidogrel (PLAVIX) tablet 300 mg (has no administration in time range)     Followed by  clopidogrel (PLAVIX) tablet 75 mg (has no administration in time range)  iohexol (OMNIPAQUE) 350 MG/ML injection 100 mL (100 mLs Intravenous Contrast Given 01/23/21 0551)    ED Course  I have reviewed the triage vital signs and the nursing notes.  Pertinent labs & imaging results that were available during my care of the patient were reviewed by me and considered in my medical decision making (see chart for details).  MDM Rules/Calculators/A&P Apparent stroke with aphasia and right facial droop.  Patient went for emergent CT scan which was consistent with small acute cortical infarct in the left frontal region and remote left cerebellar infarct.  CT perfusion study confirmed 17 mL left hemisphere stroke.  CT angiogram showed no acute occlusions.  He has been sent for MRI and will need to be admitted for stroke evaluation.  With no vascular occlusion, not a candidate for endovascular treatment.  Laboratory work-up shows mild leukopenia, mild hyponatremia which is not felt to be clinically significant.  ECG shows left ventricular hypertrophy with secondary repolarization changes, no prior available for comparison.  Neurology consult appreciated, will admit for stroke work-up.  Case is discussed with Dr. Onalee Huaavid of [facility], who agrees to admit the patient.  Final Clinical Impression(s) / ED Diagnoses Final diagnoses:  Cerebrovascular accident (CVA), unspecified mechanism (HCC)  Elevated blood-pressure reading without diagnosis of hypertension  Hyponatremia  Leukopenia, unspecified type    Rx / DC Orders ED Discharge Orders  None       Dione Booze, MD 01/23/21 [phone]

## 2021-01-23 NOTE — Consult Note (Signed)
Neurology Consultation Reason for Consult: Code Stroke  Requesting Physician: Dione Booze   CC: Difficulty speaking  History is obtained from: EMS, patient's sister and a limited amount of history from the patient himself  HPI: Todd Vega is a 60 y.o. male with a past medical history significant for daily alcohol use and avoidance of medical care.  He lives with his sister who provides most of this history.  She reports he is a very private person and she believes he would not tell her of any medical complaints or symptoms even if he was not feeling well.  Within these limits, she reports he has a chronic cough which is attributed to allergies and that he the only medication he takes is Allegra as well as occasional Aleve.  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  He also drinks at least 3-4 20 ounce beers with high alcohol content (8.75%) on a daily basis.  He does not smoke or use any other substances to her knowledge.  She thinks he has been sleeping more than usual but has noticed any new signs or symptoms concerning for infection or other medical issues  The patient confirms that it has been greater than 5 years since he has seen a physician for anything.  He shakes his head no when asked if he takes any medications.  He appears to deny headache, fevers, any recent bleeding issues.    Per EMS he reported that his symptoms started at 2 AM by holding up 2 fingers when asked what time he began to have speech difficulty.  His sister last spoke to him at 7 PM the night before.  She notes that he typically wakes up for work around 2 AM and although he is off work this week he has retained that habit of waking up at that time.  LKW: Initially reported by EMS is 2 AM but on further questioning from family suspect 7 PM on 4/8 is true last known well and that patient woke up at 2 AM with symptoms tPA given?: No,  due to unreliable last known well and diffusion restriction matching area of hypodensity on head CT suggesting onset greater than 6 hours Premorbid modified rankin scale:      0 - No symptoms.  ROS: Unable to obtain due to altered mental status.   No past medical history on file. -No known medical problems  No family history on file. Significant family history of diabetes per sister  Social History:  has no history on file for tobacco use, alcohol use, and drug use. Daily alcohol user per sister but no other substances including tobacco/nicotine  Exam: Current vital signs: Wt 60.4 kg  Vital signs in last 24 hours: Weight:  [60.4 kg] 60.4 kg (04/09 0556)   Physical Exam  Constitutional: Appears well-developed and well-nourished.  Psych: Affect appropriate to situation, mildly frustrated Eyes: No scleral injection HENT: No oropharyngeal obstruction.  MSK: no joint deformities.  Cardiovascular: Normal rate and regular rhythm.  Respiratory: Effort normal, non-labored breathing GI: Soft.  No distension. There is no tenderness.  Skin: Warm dry and intact visible skin  Neuro: Mental Status: Patient is awake, alert, oriented to person, month, and year by yes/no choices Patient is able very limited history due to no verbal output.  He does follow most commands correctly but occasionally makes small errors for example showing me 4 fingers instead of 5 Cranial  Nerves: II: Visual Fields are appears to have a right field cut by blink to threat.  III,IV, VI: EOMI notable for a left gaze preference but can bury to the right with effeort V: Facial sensation is symmetric to eyelash brush VII: Facial movement is notable for a right facial droop.  VIII: hearing is intact to voice XII: tongue is midline without atrophy or fasciculations.  Motor: Mild RUE pronator drift, otherwise can maintain all other extermities antigravity without drift  Sensory: Sensation is symmetric to light touch in  the arms and legs Cerebellar: FNF and HKS are intact bilaterally  NIHSS total 12 Score breakdown: 1 point for mild left gaze preference, 2 points for visual field deficit on the right, 2 points for right facial droop, 1 point for right arm weakness, 3 points for being mute and 2 points for severe dysarthria, 1 point for mild neglect of the right side  I have reviewed labs in epic and the results pertinent to this consultation are: Mild hyponatremia (sodium 133), mild hyperglycemia (glucose 116), Mild leukopenia at 3.3 may be within normal given   I have reviewed the images obtained: Head CT aspects of 9 based on hypodense area in the M6 territory CTA without clear large vessel occlusion, scattered atherosclerosis CT perfusion with 17 mL at risk however this corresponds to area of hypodensity on head CT which is likely therefore over called MRI brain with early FLAIR changes confirming patient is out of the tPA window   Impression: 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  Recommendations:  # Left MCA Broca's area stroke - Stroke labs TSH, HgbA1c, fasting lipid panel - MRI brain completed  - CTA completed  - Frequent neuro checks - Echocardiogram - Prophylactic therapy-Antiplatelet med: Aspirin - dose 325mg  PO or 300mg  PR, followed by 81 mg daily - Plavix 300 mg load with 75 mg daily for 21 day course  - Risk factor modification, ethanol use counseling, diet, exercise  - Telemetry monitoring; - Blood pressure goal   - Permissive hypertension to 220/120 for 24 hrs due to likely chronic hypertension and lack of cerebral autoregulation in an eloquent area; long term goal will be normotension which should be gradually achieved  - PT consult, OT consult, Speech consult, unless patient is back to baseline - Stroke team to follow  # Daily alcohol use - B1 and B12 levels  - Thiamine 500 mg q8hr x 3 days, followed by 100 mg IV  daily, 100 mg PO daily at discharge - CIWAS and additional vitamins per primary team   MD-PhD [facility] [phone]

## 2021-01-23 NOTE — Evaluation (Signed)
Physical Therapy Evaluation Patient Details Name: Todd Vega MRN: [id number] DOB: Mar 12, 1961 Today's Date: 01/23/2021   History of Present Illness  Todd Vega is a 60 y.o. male with no significant  past medical history comes in after waking up in the middle night with aphasia and right-sided facial droop.  Patient has not seen a physician in over 5 years and has no known medical issues except allergies for which he takes Allegra.  He denies any weakness numbness or tingling in his extremities.  Code stroke was called teleneurology has seen the patient.  Not a TPA candidate.  Patient does indeed in fact show an infarct in MCA territory.  Patient is being referred for admission for further stroke work-up.  He is hypertensive with systolics over 170 in the ED.  Imaging shows R MCA infarct  Clinical Impression  Patient received in bed, he is aphasic. Can shake head yes or no and use hand gestures to answer questions. Patient performed bed mobility independently. Donned and doffed shoes independently. Demonstrates slight weakness of R UE. B LEs appear equal in strength and ROM. Patient performed sit to stand with supervision and ambulated 200 feet without ad, supervision. Patient had one instance of catching R foot on floor. No lob. He will benefit from PT follow up while here to ensure safety with mobility and no progressing of signs or symptoms.          Follow Up Recommendations No PT follow up    Equipment Recommendations  None recommended by PT    Recommendations for Other Services       Precautions / Restrictions Precautions Precautions: None Restrictions Weight Bearing Restrictions: No      Mobility  Bed Mobility Overal bed mobility: Independent                  Transfers Overall transfer level: Needs assistance Equipment used: None Transfers: Sit to/from Stand Sit to Stand: Supervision            Ambulation/Gait Ambulation/Gait assistance: Supervision Gait  Distance (Feet): 200 Feet Assistive device: None Gait Pattern/deviations: Step-through pattern Gait velocity: Decr   General Gait Details: possible slight decreased foot clearance on right (caught his toe one time while ambulating), but near normal  Stairs            Wheelchair Mobility    Modified Rankin (Stroke Patients Only) Modified Rankin (Stroke Patients Only) Pre-Morbid Rankin Score: No symptoms Modified Rankin: Moderate disability     Balance Overall balance assessment: Mild deficits observed, not formally tested                                           Pertinent Vitals/Pain Pain Assessment: No/denies pain    Home Living Family/patient expects to be discharged to:: Private residence Living Arrangements: Other relatives Available Help at Discharge: Family           Home Equipment: None      Prior Function Level of Independence: Independent         Comments: Patient was working prior to admission, independent     Hand Dominance        Extremity/Trunk Assessment   Upper Extremity Assessment Upper Extremity Assessment: Defer to OT evaluation    Lower Extremity Assessment Lower Extremity Assessment: Overall WFL for tasks assessed    Cervical / Trunk Assessment Cervical / Trunk Assessment: Normal  Communication   Communication: Expressive difficulties  Cognition Arousal/Alertness: Awake/alert Behavior During Therapy: WFL for tasks assessed/performed Overall Cognitive Status: Within Functional Limits for tasks assessed                                        General Comments      Exercises     Assessment/Plan    PT Assessment Patient needs continued PT services  PT Problem List Decreased coordination;Decreased balance       PT Treatment Interventions Gait training;Stair training;Patient/family education    PT Goals (Current goals can be found in the Care Plan section)  Acute Rehab PT  Goals Patient Stated Goal: none stated PT Goal Formulation: Patient unable to participate in goal setting Time For Goal Achievement: 02/06/21    Frequency Min 2X/week   Barriers to discharge        Co-evaluation               AM-PAC PT "6 Clicks" Mobility  Outcome Measure Help needed turning from your back to your side while in a flat bed without using bedrails?: None Help needed moving from lying on your back to sitting on the side of a flat bed without using bedrails?: None Help needed moving to and from a bed to a chair (including a wheelchair)?: None Help needed standing up from a chair using your arms (e.g., wheelchair or bedside chair)?: None Help needed to walk in hospital room?: None Help needed climbing 3-5 steps with a railing? : A Little 6 Click Score: 23    End of Session   Activity Tolerance: Patient tolerated treatment well Patient left: in bed;with call bell/phone within reach;with bed alarm set Nurse Communication: Mobility status PT Visit Diagnosis: Difficulty in walking, not elsewhere classified (R26.2)    Time: 1337-1350 PT Time Calculation (min) (ACUTE ONLY): 13 min   Charges:   PT Evaluation $PT Eval Moderate Complexity: 1 Mod          Isaiyah Feldhaus, PT, GCS 01/23/21,2:06 PM

## 2021-01-23 NOTE — Progress Notes (Signed)
  Echocardiogram 2D Echocardiogram has been performed.  Delcie Roch 01/23/2021, 11:28 AM

## 2021-01-23 NOTE — H&P (Signed)
History and Physical    Todd Vega ZOX:[id number] DOB: 04/07/1961 DOA: 01/23/2021  PCP: No primary care provider on file.  Patient coming from: Home  Chief Complaint: Facial droop he cannot speak  HPI: Todd SauceDavid Obeirne is a 60 y.o. male with no significant  past medical history comes in after waking up in the middle night and waking up his wife for aphasia and right-sided facial droop.  Patient has not seen a physician in over 5 years and has no known medical issues except allergies for which he takes Allegra.  He denies any weakness numbness or tingling in his extremities.  Code stroke was called teleneurology has seen the patient.  Not a TPA candidate.  Patient does indeed in fact show an infarct in MCA territory.  Patient is being referred for admission for further stroke work-up.  He is hypertensive with systolics over 170 in the ED.   Review of Systems: As per HPI otherwise 10 point review of systems negative.   No past medical history on file.  None    has no history on file for tobacco use, alcohol use, and drug use.  No Known Allergies  No family history on file.  No premature coronary artery disease  Prior to Admission medications   Not on File  None  Physical Exam: Vitals:   01/23/21 0556 01/23/21 0643 01/23/21 0645 01/23/21 0658  BP:   (!) 170/127 (!) 169/116  Pulse:  65 (!) 104   Resp:   (!) 27 20  Temp:    98 F (36.7 C)  TempSrc:      SpO2:   100% 99%  Weight: 60.4 kg         Constitutional: NAD, calm, comfortable a phasic Vitals:   01/23/21 0556 01/23/21 0643 01/23/21 0645 01/23/21 0658  BP:   (!) 170/127 (!) 169/116  Pulse:  65 (!) 104   Resp:   (!) 27 20  Temp:    98 F (36.7 C)  TempSrc:      SpO2:   100% 99%  Weight: 60.4 kg      Eyes: PERRL, lids and conjunctivae normal ENMT: Mucous membranes are moist. Posterior pharynx clear of any exudate or lesions.Normal dentition.  Neck: normal, supple, no masses, no thyromegaly Respiratory: clear to  auscultation bilaterally, no wheezing, no crackles. Normal respiratory effort. No accessory muscle use.  Cardiovascular: Regular rate and rhythm, no murmurs / rubs / gallops. No extremity edema. 2+ pedal pulses. No carotid bruits.  Abdomen: no tenderness, no masses palpated. No hepatosplenomegaly. Bowel sounds positive.  Musculoskeletal: no clubbing / cyanosis. No joint deformity upper and lower extremities. Good ROM, no contractures. Normal muscle tone.  Skin: no rashes, lesions, ulcers. No induration Neurologic: Right facial droop sensation intact, DTR normal. Strength 5/5 in all 4.  Aphasic Psychiatric: Aphasic normal mood.    Labs on Admission: I have personally reviewed following labs and imaging studies  CBC: Recent Labs  Lab 01/23/21 0531 01/23/21 0535  WBC 3.3*  --   NEUTROABS 2.1  --   HGB 14.5 17.0  HCT 44.3 50.0  MCV 79.0*  --   PLT 154  --    Basic Metabolic Panel: Recent Labs  Lab 01/23/21 0531 01/23/21 0535  NA 133* 135  K 4.1 4.1  CL 100 103  CO2 20*  --   GLUCOSE 116* 112*  BUN 14 16  CREATININE 1.09 1.00  CALCIUM 8.8*  --    GFR: CrCl cannot be calculated (Unknown  ideal weight.). Liver Function Tests: Recent Labs  Lab 01/23/21 0531  AST 34  ALT 18  ALKPHOS 71  BILITOT 0.3  PROT 7.7  ALBUMIN 3.6   No results for input(s): LIPASE, AMYLASE in the last 168 hours. No results for input(s): AMMONIA in the last 168 hours. Coagulation Profile: Recent Labs  Lab 01/23/21 0531  INR 1.1   Cardiac Enzymes: No results for input(s): CKTOTAL, CKMB, CKMBINDEX, TROPONINI in the last 168 hours. BNP (last 3 results) No results for input(s): PROBNP in the last 8760 hours. HbA1C: No results for input(s): HGBA1C in the last 72 hours. CBG: Recent Labs  Lab 01/23/21 0530  GLUCAP 117*   Lipid Profile: No results for input(s): CHOL, HDL, LDLCALC, TRIG, CHOLHDL, LDLDIRECT in the last 72 hours. Thyroid Function Tests: No results for input(s): TSH, T4TOTAL,  FREET4, T3FREE, THYROIDAB in the last 72 hours. Anemia Panel: No results for input(s): VITAMINB12, FOLATE, FERRITIN, TIBC, IRON, RETICCTPCT in the last 72 hours. Urine analysis: No results found for: COLORURINE, APPEARANCEUR, LABSPEC, PHURINE, GLUCOSEU, HGBUR, BILIRUBINUR, KETONESUR, PROTEINUR, UROBILINOGEN, NITRITE, LEUKOCYTESUR Sepsis Labs: !!!!!!!!!!!!!!!!!!!!!!!!!!!!!!!!!!!!!!!!!!!! @LABRCNTIP (procalcitonin:4,lacticidven:4) )No results found for this or any previous visit (from the past 240 hour(s)).   Radiological Exams on Admission: CT ANGIO HEAD W OR WO CONTRAST  Result Date: 01/23/2021 CLINICAL DATA:  Stroke-like symptoms EXAM: CT ANGIOGRAPHY HEAD AND NECK CT PERFUSION BRAIN TECHNIQUE: Multidetector CT imaging of the head and neck was performed using the standard protocol during bolus administration of intravenous contrast. Multiplanar CT image reconstructions and MIPs were obtained to evaluate the vascular anatomy. Carotid stenosis measurements (when applicable) are obtained utilizing NASCET criteria, using the distal internal carotid diameter as the denominator. Multiphase CT imaging of the brain was performed following IV bolus contrast injection. Subsequent parametric perfusion maps were calculated using RAPID software. CONTRAST:  Dose is currently not known on this in progress study COMPARISON:  Contemporaneous head CT FINDINGS: CTA NECK FINDINGS Aortic arch: No acute finding. Mild atheromatous wall thickening. Three vessel branching. Right carotid system: Vessels are smooth and widely patent with no significant atheromatous changes. Left carotid system: Vessels are smooth and widely patent. Mild atheromatous plaque at the bifurcation. Vertebral arteries: No proximal subclavian stenosis. Codominant vertebral arteries that are smooth and widely patent to the dura. Skeleton: No acute finding. Dental caries with periapical erosions and torus mandibularis. Other neck: Dense calcification in the  superficial left parotid. Upper chest: Clustered small pulmonary nodules in the right upper lobe which have an inflammatory pattern. Review of the MIP images confirms the above findings CTA HEAD FINDINGS Anterior circulation: Atheromatous plaque on the carotid siphons. No branch occlusion, beading, or proximal flow limiting stenosis. Posterior circulation: Vertebral and basilar arteries are smooth and widely patent. No proximal branch occlusion, beading, or aneurysm. Venous sinuses: Diffusely patent Anatomic variants: None significant Review of the MIP images confirms the above findings CT Brain Perfusion Findings: ASPECTS: 9 CBF (<30%) Volume: 34mL Perfusion (Tmax>6.0s) volume: 50mL Mismatch Volume: 31mL IMPRESSION: 1. No emergent large vessel occlusion. 2. 17 cc ischemic area in the lateral left frontal region, some of which is completed infarct by noncontrast CT. 3. Mild atheromatous changes without proximal flow limiting stenosis or embolic source. Electronically Signed   By: 19m M.D.   On: 01/23/2021 05:59   CT ANGIO NECK W OR WO CONTRAST  Result Date: 01/23/2021 CLINICAL DATA:  Stroke-like symptoms EXAM: CT ANGIOGRAPHY HEAD AND NECK CT PERFUSION BRAIN TECHNIQUE: Multidetector CT imaging of the head and neck was  performed using the standard protocol during bolus administration of intravenous contrast. Multiplanar CT image reconstructions and MIPs were obtained to evaluate the vascular anatomy. Carotid stenosis measurements (when applicable) are obtained utilizing NASCET criteria, using the distal internal carotid diameter as the denominator. Multiphase CT imaging of the brain was performed following IV bolus contrast injection. Subsequent parametric perfusion maps were calculated using RAPID software. CONTRAST:  Dose is currently not known on this in progress study COMPARISON:  Contemporaneous head CT FINDINGS: CTA NECK FINDINGS Aortic arch: No acute finding. Mild atheromatous wall thickening.  Three vessel branching. Right carotid system: Vessels are smooth and widely patent with no significant atheromatous changes. Left carotid system: Vessels are smooth and widely patent. Mild atheromatous plaque at the bifurcation. Vertebral arteries: No proximal subclavian stenosis. Codominant vertebral arteries that are smooth and widely patent to the dura. Skeleton: No acute finding. Dental caries with periapical erosions and torus mandibularis. Other neck: Dense calcification in the superficial left parotid. Upper chest: Clustered small pulmonary nodules in the right upper lobe which have an inflammatory pattern. Review of the MIP images confirms the above findings CTA HEAD FINDINGS Anterior circulation: Atheromatous plaque on the carotid siphons. No branch occlusion, beading, or proximal flow limiting stenosis. Posterior circulation: Vertebral and basilar arteries are smooth and widely patent. No proximal branch occlusion, beading, or aneurysm. Venous sinuses: Diffusely patent Anatomic variants: None significant Review of the MIP images confirms the above findings CT Brain Perfusion Findings: ASPECTS: 9 CBF (<30%) Volume: 58mL Perfusion (Tmax>6.0s) volume: 36mL Mismatch Volume: 42mL IMPRESSION: 1. No emergent large vessel occlusion. 2. 17 cc ischemic area in the lateral left frontal region, some of which is completed infarct by noncontrast CT. 3. Mild atheromatous changes without proximal flow limiting stenosis or embolic source. Electronically Signed   By: Marnee Spring M.D.   On: 01/23/2021 05:59   MR BRAIN WO CONTRAST  Result Date: 01/23/2021 CLINICAL DATA:  Acute stroke. EXAM: MRI HEAD WITHOUT CONTRAST TECHNIQUE: Multiplanar, multiecho pulse sequences of the brain and surrounding structures were obtained without intravenous contrast. COMPARISON:  CT and CTA with CT perfusion from earlier today FINDINGS: Brain: Restricted diffusion in the posterior and lateral left frontal cortex and subjacent white matter.  This is the area that was positive on noncontrast head CT. Less prominent FLAIR signal in this area but there are is early detectable swelling and cortical hyperintensity on a few slices. Two subcentimeter areas are seen at the MCA watershed zone at the frontal and occipital lobes, too small for FLAIR correlation on this motion degraded scan. Background chronic small vessel ischemia. Small remote left inferior cerebellar infarct. Remote micro hemorrhages in the right thalamus and right cerebellum. Vascular: Normal flow voids. Skull and upper cervical spine: Negative Sinuses/Orbits: Retention cysts in the left maxillary sinus. Other: Progressively motion degraded. IMPRESSION: 1. Band of restricted diffusion in the lateral left frontal lobe. FLAIR changes are less apparent but this area has the appearance of completed infarct by CT. 2. Smaller areas of altered diffusion at the border zone of the left MCA territory. 3. Background chronic small vessel ischemia. 4. Progressively motion degraded study. Electronically Signed   By: Marnee Spring M.D.   On: 01/23/2021 06:50   CT CEREBRAL PERFUSION W CONTRAST  Result Date: 01/23/2021 CLINICAL DATA:  Stroke-like symptoms EXAM: CT ANGIOGRAPHY HEAD AND NECK CT PERFUSION BRAIN TECHNIQUE: Multidetector CT imaging of the head and neck was performed using the standard protocol during bolus administration of intravenous contrast. Multiplanar CT image reconstructions  and MIPs were obtained to evaluate the vascular anatomy. Carotid stenosis measurements (when applicable) are obtained utilizing NASCET criteria, using the distal internal carotid diameter as the denominator. Multiphase CT imaging of the brain was performed following IV bolus contrast injection. Subsequent parametric perfusion maps were calculated using RAPID software. CONTRAST:  Dose is currently not known on this in progress study COMPARISON:  Contemporaneous head CT FINDINGS: CTA NECK FINDINGS Aortic arch: No acute  finding. Mild atheromatous wall thickening. Three vessel branching. Right carotid system: Vessels are smooth and widely patent with no significant atheromatous changes. Left carotid system: Vessels are smooth and widely patent. Mild atheromatous plaque at the bifurcation. Vertebral arteries: No proximal subclavian stenosis. Codominant vertebral arteries that are smooth and widely patent to the dura. Skeleton: No acute finding. Dental caries with periapical erosions and torus mandibularis. Other neck: Dense calcification in the superficial left parotid. Upper chest: Clustered small pulmonary nodules in the right upper lobe which have an inflammatory pattern. Review of the MIP images confirms the above findings CTA HEAD FINDINGS Anterior circulation: Atheromatous plaque on the carotid siphons. No branch occlusion, beading, or proximal flow limiting stenosis. Posterior circulation: Vertebral and basilar arteries are smooth and widely patent. No proximal branch occlusion, beading, or aneurysm. Venous sinuses: Diffusely patent Anatomic variants: None significant Review of the MIP images confirms the above findings CT Brain Perfusion Findings: ASPECTS: 9 CBF (<30%) Volume: 53mL Perfusion (Tmax>6.0s) volume: 28mL Mismatch Volume: 92mL IMPRESSION: 1. No emergent large vessel occlusion. 2. 17 cc ischemic area in the lateral left frontal region, some of which is completed infarct by noncontrast CT. 3. Mild atheromatous changes without proximal flow limiting stenosis or embolic source. Electronically Signed   By: Marnee Spring M.D.   On: 01/23/2021 05:59   CT HEAD CODE STROKE WO CONTRAST  Result Date: 01/23/2021 CLINICAL DATA:  Code stroke.  Aphasia and right facial droop. EXAM: CT HEAD WITHOUT CONTRAST TECHNIQUE: Contiguous axial images were obtained from the base of the skull through the vertex without intravenous contrast. COMPARISON:  None. FINDINGS: Brain: Small area of gray-white matter differentiation loss along the  high and posterior left frontal cortex. No acute hemorrhage, hydrocephalus, or masslike finding. Small remote left cerebellar infarct. Vascular: No definite hyperdense vessel in all planes. Skull: Normal. Negative for fracture or focal lesion. Sinuses/Orbits: Gaze to the left. Other: These results were communicated to Dr. Iver Nestle at 5:43 amon 4/9/2022by text page via the [facility] messaging system. ASPECTS [facility] Stroke Program Early CT Score) - Ganglionic level infarction (caudate, lentiform nuclei, internal capsule, insula, M1-M3 cortex): 7 - Supraganglionic infarction (M4-M6 cortex): 2 Total score (0-10 with 10 being normal): 9 IMPRESSION: 1. 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 2. No acute hemorrhage. 3. Small remote left cerebellar infarct. Electronically Signed   By: Marnee Spring M.D.   On: 01/23/2021 05:44    EKG: Independently reviewed.  Sinus rhythm incomplete left bundle branch block  Assessment/Plan 60 year old male with acute CVA, aphasia and right-sided facial droop  Principal Problem:    Acute CVA (cerebrovascular accident) (HCC)-obtain cardiac echo.  Aspirin and Plavix per neurology recommendations.  Not TPA candidate.  Check hemoglobin A1c and fasting lipid panel.  Frequent neurological checks.  Allow permissive hypertension to 220/120 for 24 hours.  Obtain physical therapy, speech therapy and occupational therapy consultations.  Active Problems:    HTN (hypertension)-permissive hypertension for the first 24 hours as above.     DVT prophylaxis: None Code Status: Full Family  Communication: Yes wife at bedside Disposition Plan: Days Consults called: Neurology Admission status: Admission   Whyatt,Ellory Khurana A MD [facility]  If 7PM-7AM, please contact night-coverage [email or web address] Password [email or web address]  01/23/2021, 7:12 AM

## 2021-01-23 NOTE — Progress Notes (Signed)
Pt admitted to 3W AxOx4, VS as per flow, elevated BP. Pt oriented to 3W processes. Pt familiar with the [facility] system. All questions and concerns addressed. Pt is severally dysarthric and visibly upset about it. Call bell placed within reach, will continue to monitor and maintain safety.

## 2021-01-23 NOTE — Progress Notes (Addendum)
STROKE TEAM PROGRESS NOTE    Interval History   No acute events overnight, patient resting in bed with no family at bedside patient continues to have severe expressive aphasia.  MRI scan shows left frontal MCA branch infarct and CT angiogram shows no large vessel stenosis or occlusion.  2D echo showed significantly diminished ejection fraction of 25 to 30% and global hypokinesis but no definite clot.  Left atrium is moderately dilated.  Pertinent Lab Work and Imaging    01/23/21 CT Head WO IV Contrast 1. 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 2. No acute hemorrhage. 3. Small remote left cerebellar infarct.  01/23/21 CT Angio Head and Neck W WO IV Contrast 1. No emergent large vessel occlusion. 2. 17 cc ischemic area in the lateral left frontal region, some of which is completed infarct by noncontrast CT. 3. Mild atheromatous changes without proximal flow limiting stenosis or embolic source.  01/23/21 MRI Brain WO IV Contrast 1. Band of restricted diffusion in the lateral left frontal lobe. FLAIR changes are less apparent but this area has the appearance of completed infarct by CT. 2. Smaller areas of altered diffusion at the border zone of the left MCA territory. 3. Background chronic small vessel ischemia. 4. Progressively motion degraded study.  01/23/21 Echocardiogram Complete  1. Left ventricular ejection fraction, by estimation, is 25 to 30%. The left ventricle has severely decreased function. The left ventricle demonstrates global hypokinesis. The left ventricular internal cavity size was severely dilated. There is mild asymmetric left ventricular hypertrophy of the basal and septal segments.  Left ventricular diastolic parameters are consistent with Grade I diastolic dysfunction (impaired relaxation).  2. Right ventricular systolic function is normal. The right ventricular size is normal.  3. Left atrial size was moderately dilated.  4. The mitral  valve is normal in structure. Mild mitral valve regurgitation. No evidence of mitral stenosis.  5. The aortic valve is tricuspid. There is mild calcification of the aortic valve. Aortic valve regurgitation is not visualized. Mild aortic valve sclerosis is present, with no evidence of aortic valve stenosis.  6. Aortic dilatation noted. There is mild dilatation of the aortic root, measuring 40 mm.  7. The inferior vena cava is normal in size with greater than 50% respiratory variability, suggesting right atrial pressure of 3 mmHg.   Physical Examination   Constitutional: Frail middle-aged African-American male not in distress. Cardiovascular: Normal RR Respiratory: No increased WOB   Mental status: Alert, follows all commands  Speech: Dense expressive aphasia with impaired naming, fluency and repetition. Able to name a few objects and struggles to read sentences on stroke card. Dysarthric.  Good comprehension and follows most commands. Cranial nerves: Left gaze preference able to cross midline, VFF, Right lower facial weakness.  Tongue midline  Motor: Normal bulk and tone. Antigravity throughout with no drift.  Diminished fine finger movements on the right.  Orbits left over right upper extremity.  Mild right grip weakness. Sensory: Intact to light tough throughout  Coordination: Intact FNF  Gait: Deferred  NIH stroke scale 4  Assessment and Plan   [name] is a 60 y.o. male w/pmh of alcohol use and medical non compliance who presents with a L MCA Stroke. He was outside of the time window for IVTPA and did not qualify for thrombectomy due to lack of LVO.   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 Stroke work up was initiated. MRI Brain w/  L MCA stroke. CTA Head and Neck w/no significant atherosclerosis. Echocardiogram w/reduced EF of 25 to 30 %, moderately dilated left atrium, left ventricle with global hypokinesis. Stroke  labs w/A1C 5.6, lipid panel pending. The etiology of his stroke is likely cardioembolic given heart failure noted on echocardiogram. Need further cardic work up with TEE to evaluate for LV thrombus.  - DAPT for 21 days then Aspirin monotherapy  - Start statin pending lipid panel results  - Recommend ordering TEE to be done Monday as a part of stroke work up  - F/U lipid panel results  - Stroke clinic follow up at discharge   #Hypertension Has hypertension, SBP trending 160-180 currently. Recommend permissive HTN for 24-48 hours post stroke then gradually lower SBP.   #Hyperlipidemia From a stroke prevention stand point, the LDL goal is < 70. LDL pending.   Hospital day # 0  Stark Jock, NP  [facility] Nurse Practitioner Patient seen and discussed with attending physician Dr. Pearlean Brownie  I have personally obtained history,examined this patient, reviewed notes, independently viewed imaging studies, participated in medical decision making and plan of care.ROS completed by me personally and pertinent positives fully documented  I have made any additions or clarifications directly to the above note. Agree with note above.  He presented with severe expressive aphasia due to left MCA branch infarct likely of cardioembolic etiology from his cardiomyopathy.  Recommend dual antiplatelet therapy of aspirin Plavix for 3 weeks followed by aspirin alone.  Check TEE to look for left atrial clot and consider long-term cardiac monitoring for paroxysmal atrial fibrillation unless cardiology feels patient is a candidate for defibrillator.  Aggressive risk factor modification.  Greater than 50% time during this 35-minute visit was spent in counseling and coordination of care about his embolic stroke and discussion about evaluation and treatment and discussion with care team.  Delia Heady, MD Medical Director [facility] Pager: [phone] 01/23/2021 6:17 PM    To contact Stroke  Continuity provider, please refer to [email or web address]. After hours, contact General Neurology

## 2021-01-23 NOTE — ED Triage Notes (Signed)
BIB EMS after noticing at 0200 he couldn't talk and has right sided facial droop.  Patient reports no medical hx and hasnt been to MD in >5 years. Bilateral 18g IV started by EMS

## 2021-01-24 ENCOUNTER — Encounter (HOSPITAL_COMMUNITY): Payer: Self-pay | Admitting: Family Medicine

## 2021-01-24 DIAGNOSIS — I10 Essential (primary) hypertension: Secondary | ICD-10-CM

## 2021-01-24 DIAGNOSIS — I639 Cerebral infarction, unspecified: Secondary | ICD-10-CM

## 2021-01-24 DIAGNOSIS — I5043 Acute on chronic combined systolic (congestive) and diastolic (congestive) heart failure: Secondary | ICD-10-CM

## 2021-01-24 LAB — BASIC METABOLIC PANEL
Anion gap: 10 (ref 5–15)
BUN: 14 mg/dL (ref 6–20)
CO2: 23 mmol/L (ref 22–32)
Calcium: 9.1 mg/dL (ref 8.9–10.3)
Chloride: 100 mmol/L (ref 98–111)
Creatinine, Ser: 1.2 mg/dL (ref 0.61–1.24)
GFR, Estimated: >60 mL/min (ref >60)
Glucose, Bld: 112 mg/dL — ABNORMAL HIGH (ref 70–99)
Potassium: 3.9 mmol/L (ref 3.5–5.1)
Sodium: 133 mmol/L — ABNORMAL LOW (ref 135–145)

## 2021-01-24 LAB — LIPID PANEL
Cholesterol: 185 mg/dL (ref 0–200)
HDL: 86 mg/dL (ref >40)
LDL Cholesterol: 85 mg/dL (ref 0–99)
Total CHOL/HDL Ratio: 2.2 RATIO
Triglycerides: 71 mg/dL (ref <150)
VLDL: 14 mg/dL (ref 0–40)

## 2021-01-24 MED ORDER — ATORVASTATIN CALCIUM 10 MG PO TABS
20.0000 mg | ORAL_TABLET | Freq: Every day | ORAL | Status: DC
  Administered 2021-01-24 – 2021-01-28 (×5): 20 mg via ORAL
  Filled 2021-01-24 (×5): qty 2

## 2021-01-24 MED ORDER — MAGNESIUM SULFATE 2 GM/50ML IV SOLN
2.0000 g | Freq: Once | INTRAVENOUS | Status: AC
  Administered 2021-01-24: 2 g via INTRAVENOUS
  Filled 2021-01-24: qty 50

## 2021-01-24 MED ORDER — ASPIRIN EC 81 MG PO TBEC
81.0000 mg | DELAYED_RELEASE_TABLET | Freq: Every day | ORAL | Status: DC
  Administered 2021-01-24 – 2021-01-26 (×2): 81 mg via ORAL
  Filled 2021-01-24 (×3): qty 1

## 2021-01-24 NOTE — Evaluation (Signed)
Occupational Therapy Evaluation Patient Details Name: Todd Vega MRN: [id number] DOB: 1961/02/18 Today's Date: 01/24/2021    History of Present Illness 60 yo malea admitted with R side weakness, R facial droop and aphasia. 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 Pt currently with functional limitiations due to the deficits listed below (see OT problem list). Pt noted to have visual deficits and L gaze preference. Pt with visual scanning task with errors in R visual fields. Pt with balance deficits with dynamic balance task due to visual challenges. Pt asked to make bed and only attending to L visual side. Pt then moving body and seeing new sections that missed with initial attempt.  Pt will benefit from skilled OT to increase their independence and safety with adls and balance to allow discharge outpatient OT ( visual R field task needed).     Follow Up Recommendations  Outpatient OT    Equipment Recommendations  None recommended by OT    Recommendations for Other Services       Precautions / Restrictions Precautions Precautions: None      Mobility Bed Mobility Overal bed mobility: Modified Independent                  Transfers Overall transfer level: Modified independent                    Balance Overall balance assessment: Mild deficits observed, not formally tested                               Standardized Balance Assessment Standardized Balance Assessment : Berg Balance Test;Dynamic Gait Index Berg Balance Test Sit to Stand: Able to stand without using hands and stabilize independently Standing Unsupported: Able to stand safely 2 minutes Stand to Sit: Sits safely with minimal use of hands Turn 360 Degrees: Able to turn 360 degrees safely but slowly Dynamic Gait Index Level Surface: Normal Change in Gait Speed: Mild  Impairment Gait with Horizontal Head Turns: Mild Impairment Gait with Vertical Head Turns: Mild Impairment Step Over Obstacle: Mild Impairment Step Around Obstacles: Moderate Impairment     ADL either performed or assessed with clinical judgement   ADL Overall ADL's : Needs assistance/impaired Eating/Feeding: Modified independent                   Lower Body Dressing: Supervision/safety;Sit to/from stand Lower Body Dressing Details (indicate cue type and reason): able to don shoes and tie the R shoe lck Toilet Transfer: Supervision/safety           Functional mobility during ADLs: Supervision/safety General ADL Comments: pt noted to have L visual field preference     Vision Baseline Vision/History: Wears glasses Wears Glasses: At all times Vision Assessment?: Yes Eye Alignment: Within Functional Limits Additional Comments: pt noted to have L bias with written visual scanning task. pt divding all lines on the line side of page and missing R side. pt when asked to walk around object stopping and pausing to problem solve the task. pt reaching for therapist for external support. pt with object in front and asked to step over more accurate and automatic.     Perception Perception Perception Tested?: Yes Perception Deficits: Inattention/neglect Inattention/Neglect: Does not attend to right visual field Spatial deficits: undershooting. pt undershooting attempting to put lid back on  marker   Praxis      Pertinent Vitals/Pain Pain Assessment: No/denies pain     Hand Dominance Right   Extremity/Trunk Assessment Upper Extremity Assessment Upper Extremity Assessment: RUE deficits/detail RUE Deficits / Details: pt with decreased fine motor but functional with hand pt was able to tie shoe RUE Coordination: decreased fine motor   Lower Extremity Assessment Lower Extremity Assessment: Defer to PT evaluation   Cervical / Trunk Assessment Cervical / Trunk Assessment:  Normal   Communication Communication Communication: Expressive difficulties   Cognition Arousal/Alertness: Awake/alert Behavior During Therapy: WFL for tasks assessed/performed Overall Cognitive Status: Impaired/Different from baseline Area of Impairment: Awareness                           Awareness: Emergent   General Comments: pt lacks awareness to visual changes   General Comments       Exercises     Shoulder Instructions      Home Living Family/patient expects to be discharged to:: Private residence Living Arrangements: Other (Comment) (sister) Available Help at Discharge: Family Type of Home: House Home Access: Level entry     Home Layout: One level     Bathroom Shower/Tub: Chief Strategy Officer: Standard     Home Equipment: None   Additional Comments: 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 unable to make out what pt is trying to spell and communicate  Lives With: Family    Prior Functioning/Environment Level of Independence: Independent                 OT Problem List: Impaired balance (sitting and/or standing);Decreased cognition;Impaired UE functional use      OT Treatment/Interventions: Self-care/ADL training;Therapeutic exercise;Neuromuscular education;Energy conservation;DME and/or AE instruction;Manual therapy;Modalities;Therapeutic activities;Cognitive remediation/compensation;Visual/perceptual remediation/compensation;Patient/family education;Balance training    OT Goals(Current goals can be found in the care plan section) Acute Rehab OT Goals Patient Stated Goal: none stated OT Goal Formulation: With patient Time For Goal Achievement: 02/07/21 Potential to Achieve Goals: Good  OT Frequency: Min 2X/week   Barriers to D/C:            Co-evaluation              AM-PAC OT "6 Clicks" Daily Activity     Outcome Measure Help from another person eating meals?: A Little Help from another person taking care of personal  grooming?: A Little Help from another person toileting, which includes using toliet, bedpan, or urinal?: A Little Help from another person bathing (including washing, rinsing, drying)?: A Little Help from another person to put on and taking off regular upper body clothing?: A Little Help from another person to put on and taking off regular lower body clothing?: A Little 6 Click Score: 18   End of Session Equipment Utilized During Treatment: Gait belt Nurse Communication: Mobility status;Precautions  Activity Tolerance: Patient tolerated treatment well Patient left: in bed;with call bell/phone within reach;with bed alarm set  OT Visit Diagnosis: Unsteadiness on feet (R26.81)                Time: 3606-7703 OT Time Calculation (min): 18 min Charges:  OT General Charges $OT Visit: 1 Visit OT Evaluation $OT Eval Moderate Complexity: 1 Mod   Brynn, OTR/L  Acute Rehabilitation Services Pager: [phone] Office: [phone] .   Mateo Flow 01/24/2021, 11:21 AM

## 2021-01-24 NOTE — Consult Note (Addendum)
Cardiology Consultation:   Patient ID: Todd Vega; [id number]; [birth year]   Admit date: 01/23/2021 Date of Consult: 01/24/2021  Primary Care Provider: No primary care provider on file. Primary Cardiologist: New to [facility]   Patient Profile:   Todd Vega is a 60 y.o. 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 Blake Divine.  History of Present Illness:   Todd Vega is a 60yo M with no significant past medical hx who presented to [facility] 01/23/21 with acute onset of aphasia and facial dropping. HPI obtained from chart review as he remains rather aphasic on exam this AM. He was reportedly in his usual state of health prior to going to bed the evening of 01/22/21. He woke in the middle of the night, around 2am with acute aphasia and right sided facial droop. He then woke his wife who called EMS for transport to the ED for further evaluation.   Code Stroke was initiated. He was not felt to be a tPA candidate. On ED arrival, emergent head CT showed small acute cortical infarct in the left frontal region and remote left cerebellar infarct. CT perfusion study confirmed 17 mL left hemisphere stroke. CT angiogram showed no acute occlusions. Brain MRI with band of restricted diffusion in the lateral left frontal lobe with smaller areas of altered diffusion at the border zone of the left MCA territory. BP on presentation was markedly elevated. Echocardiogram obtained and showed an LVEF at 25-30% with global hypokinesis, mild asymmetric LVH of the basal and septal segments, G1DD, mod LAE, mild MR, mild aortic sclerosis with no stenosis, mild dilatation of the aortic root measuring 40mm.   He is awake and able to answer simple questions. He denies personal hx of CAD however states that there is a hx of CAD in both his mother and sister although it is difficult to assess the exact cause of their disease due to verbal communication. He denies tobacco  use. He has had no chest pain, SOB, LE edema, or orthopnea symptoms. He does report a hx of intermittent palpitations and heat racing although this again is difficult to obtain details about.   Past Medical History:  Diagnosis Date  . Asthma due to seasonal allergies    The histories are not reviewed yet. Please review them in the "History" navigator section and refresh this SmartLink.   Prior to Admission medications   Medication Sig Start Date End Date Taking? Authorizing Provider  fexofenadine (ALLEGRA) 180 MG tablet Take 180 mg by mouth as needed for allergies or rhinitis.   Yes [provider]    Inpatient Medications: Scheduled Meds: . clopidogrel  75 mg Oral Daily  . folic acid  1 mg Oral Daily  . multivitamin with minerals  1 tablet Oral Daily  . sodium chloride flush  3 mL Intravenous Q12H  . [START ON 01/26/2021] thiamine injection  100 mg Intravenous Daily   Continuous Infusions: . sodium chloride Stopped (01/24/21 0545)  . magnesium sulfate bolus IVPB    . thiamine injection Stopped (01/24/21 0553)   PRN Meds: sodium chloride, acetaminophen **OR** acetaminophen (TYLENOL) oral liquid 160 mg/5 mL **OR** acetaminophen, LORazepam **OR** LORazepam, sodium chloride flush  Allergies:   No Known Allergies  Social History:   Social History   Socioeconomic History  . Marital status: Married    Spouse name: Not on file  . Number of children: Not on file  . Years of education:  Not on file  . Highest education level: Not on file  Occupational History  . Not on file  Tobacco Use  . Smoking status: Not on file  . Smokeless tobacco: Not on file  Substance and Sexual Activity  . Alcohol use: Not on file  . Drug use: Not on file  . Sexual activity: Not on file  Other Topics Concern  . Not on file  Social History Narrative  . Not on file   Social Determinants of Health   Financial Resource Strain: Not on file  Food Insecurity: Not on file  Transportation  Needs: Not on file  Physical Activity: Not on file  Stress: Not on file  Social Connections: Not on file  Intimate Partner Violence: Not on file    Family History:   No family history on file. Family Status:  No family status information on file.    ROS:  Please see the history of present illness.  All other ROS reviewed and negative.     Physical Exam/Data:   Vitals:   01/23/21 1939 01/23/21 2359 01/24/21 0400 01/24/21 0738  BP: (!) 187/115 (!) 187/120 (!) 168/99 (!) 160/116  Pulse: (!) 101 96 87 99  Resp: 17 17 20 20   Temp: 98.3 F (36.8 C) 98.3 F (36.8 C) 98.5 F (36.9 C) 98.2 F (36.8 C)  TempSrc: Axillary Oral Oral Oral  SpO2: 99% 100% 99% 100%  Weight:        Intake/Output Summary (Last 24 hours) at 01/24/2021 1042 Last data filed at 01/24/2021 0800 Gross per 24 hour  Intake 558.42 ml  Output 300 ml  Net 258.42 ml   Filed Weights   01/23/21 0556  Weight: 60.4 kg   There is no height or weight on file to calculate BMI.   General: Well developed, well nourished, NAD Neck: Negative for carotid bruits. No JVD Lungs:Clear to ausculation bilaterally. No wheezes, rales, or rhonchi. Breathing is unlabored. Cardiovascular: RRR with S1 S2. No murmurs Abdomen: Soft, non-tender, non-distended. No obvious abdominal masses. Extremities: No edema. Radial pulses 2+ bilaterally Neuro: Alert and oriented. No focal deficits. No facial asymmetry. MAE spontaneously. Psych: Responds to questions appropriately with normal affect.     EKG:  The EKG was personally reviewed and demonstrates:  01/23/21 ST with evidence of LVH, LBBB and diffuse TW changes in anterolateral regions with no tracing for comparison  Telemetry:  Telemetry was personally reviewed and demonstrates:  01/24/21 ST with rates in the low 100's, no AF seen on review   Relevant CV Studies:  Echocardiogram 01/23/21:  1. Left ventricular ejection fraction, by estimation, is 25 to 30%. The  left ventricle has  severely decreased function. The left ventricle  demonstrates global hypokinesis. The left ventricular internal cavity size  was severely dilated. There is mild  asymmetric left ventricular hypertrophy of the basal and septal segments.  Left ventricular diastolic parameters are consistent with Grade I  diastolic dysfunction (impaired relaxation).  2. Right ventricular systolic function is normal. The right ventricular  size is normal.  3. Left atrial size was moderately dilated.  4. The mitral valve is normal in structure. Mild mitral valve  regurgitation. No evidence of mitral stenosis.  5. The aortic valve is tricuspid. There is mild calcification of the  aortic valve. Aortic valve regurgitation is not visualized. Mild aortic  valve sclerosis is present, with no evidence of aortic valve stenosis.  6. Aortic dilatation noted. There is mild dilatation of the aortic root,  measuring  40 mm.  7. The inferior vena cava is normal in size with greater than 50%  respiratory variability, suggesting right atrial pressure of 3 mmHg.   Laboratory Data:  Chemistry Recent Labs  Lab 01/23/21 0531 01/23/21 0535  NA 133* 135  K 4.1 4.1  CL 100 103  CO2 20*  --   GLUCOSE 116* 112*  BUN 14 16  CREATININE 1.09 1.00  CALCIUM 8.8*  --   GFRNONAA >60  --   ANIONGAP 13  --     Total Protein  Date Value Ref Range Status  01/23/2021 7.7 6.5 - 8.1 g/dL Final   Albumin  Date Value Ref Range Status  01/23/2021 3.6 3.5 - 5.0 g/dL Final   AST  Date Value Ref Range Status  01/23/2021 34 15 - 41 U/L Final   ALT  Date Value Ref Range Status  01/23/2021 18 0 - 44 U/L Final   Alkaline Phosphatase  Date Value Ref Range Status  01/23/2021 71 38 - 126 U/L Final   Total Bilirubin  Date Value Ref Range Status  01/23/2021 0.3 0.3 - 1.2 mg/dL Final   Hematology Recent Labs  Lab 01/23/21 0531 01/23/21 0535  WBC 3.3*  --   RBC 5.61  --   HGB 14.5 17.0  HCT 44.3 50.0  MCV 79.0*  --    MCH 25.8*  --   MCHC 32.7  --   RDW 14.8  --   PLT 154  --    Cardiac EnzymesNo results for input(s): TROPONINI in the last 168 hours. No results for input(s): TROPIPOC in the last 168 hours.  BNPNo results for input(s): BNP, PROBNP in the last 168 hours.  DDimer No results for input(s): DDIMER in the last 168 hours. TSH:  Lab Results  Component Value Date   TSH 1.221 01/23/2021   Lipids: Lab Results  Component Value Date   CHOL 185 01/24/2021   HDL 86 01/24/2021   LDLCALC 85 01/24/2021   TRIG 71 01/24/2021   CHOLHDL 2.2 01/24/2021   HgbA1c: Lab Results  Component Value Date   HGBA1C 5.6 01/23/2021    Radiology/Studies:  CT ANGIO HEAD W OR WO CONTRAST  Result Date: 01/23/2021 CLINICAL DATA:  Stroke-like symptoms EXAM: CT ANGIOGRAPHY HEAD AND NECK CT PERFUSION BRAIN TECHNIQUE: Multidetector CT imaging of the head and neck was performed using the standard protocol during bolus administration of intravenous contrast. Multiplanar CT image reconstructions and MIPs were obtained to evaluate the vascular anatomy. Carotid stenosis measurements (when applicable) are obtained utilizing NASCET criteria, using the distal internal carotid diameter as the denominator. Multiphase CT imaging of the brain was performed following IV bolus contrast injection. Subsequent parametric perfusion maps were calculated using RAPID software. CONTRAST:  Dose is currently not known on this in progress study COMPARISON:  Contemporaneous head CT FINDINGS: CTA NECK FINDINGS Aortic arch: No acute finding. Mild atheromatous wall thickening. Three vessel branching. Right carotid system: Vessels are smooth and widely patent with no significant atheromatous changes. Left carotid system: Vessels are smooth and widely patent. Mild atheromatous plaque at the bifurcation. Vertebral arteries: No proximal subclavian stenosis. Codominant vertebral arteries that are smooth and widely patent to the dura. Skeleton: No acute  finding. Dental caries with periapical erosions and torus mandibularis. Other neck: Dense calcification in the superficial left parotid. Upper chest: Clustered small pulmonary nodules in the right upper lobe which have an inflammatory pattern. Review of the MIP images confirms the above findings CTA HEAD FINDINGS Anterior  circulation: Atheromatous plaque on the carotid siphons. No branch occlusion, beading, or proximal flow limiting stenosis. Posterior circulation: Vertebral and basilar arteries are smooth and widely patent. No proximal branch occlusion, beading, or aneurysm. Venous sinuses: Diffusely patent Anatomic variants: None significant Review of the MIP images confirms the above findings CT Brain Perfusion Findings: ASPECTS: 9 CBF (<30%) Volume: 59mL Perfusion (Tmax>6.0s) volume: 52mL Mismatch Volume: 29mL IMPRESSION: 1. No emergent large vessel occlusion. 2. 17 cc ischemic area in the lateral left frontal region, some of which is completed infarct by noncontrast CT. 3. Mild atheromatous changes without proximal flow limiting stenosis or embolic source. Electronically Signed   By: Marnee Spring M.D.   On: 01/23/2021 05:59   CT ANGIO NECK W OR WO CONTRAST  Result Date: 01/23/2021 CLINICAL DATA:  Stroke-like symptoms EXAM: CT ANGIOGRAPHY HEAD AND NECK CT PERFUSION BRAIN TECHNIQUE: Multidetector CT imaging of the head and neck was performed using the standard protocol during bolus administration of intravenous contrast. Multiplanar CT image reconstructions and MIPs were obtained to evaluate the vascular anatomy. Carotid stenosis measurements (when applicable) are obtained utilizing NASCET criteria, using the distal internal carotid diameter as the denominator. Multiphase CT imaging of the brain was performed following IV bolus contrast injection. Subsequent parametric perfusion maps were calculated using RAPID software. CONTRAST:  Dose is currently not known on this in progress study COMPARISON:   Contemporaneous head CT FINDINGS: CTA NECK FINDINGS Aortic arch: No acute finding. Mild atheromatous wall thickening. Three vessel branching. Right carotid system: Vessels are smooth and widely patent with no significant atheromatous changes. Left carotid system: Vessels are smooth and widely patent. Mild atheromatous plaque at the bifurcation. Vertebral arteries: No proximal subclavian stenosis. Codominant vertebral arteries that are smooth and widely patent to the dura. Skeleton: No acute finding. Dental caries with periapical erosions and torus mandibularis. Other neck: Dense calcification in the superficial left parotid. Upper chest: Clustered small pulmonary nodules in the right upper lobe which have an inflammatory pattern. Review of the MIP images confirms the above findings CTA HEAD FINDINGS Anterior circulation: Atheromatous plaque on the carotid siphons. No branch occlusion, beading, or proximal flow limiting stenosis. Posterior circulation: Vertebral and basilar arteries are smooth and widely patent. No proximal branch occlusion, beading, or aneurysm. Venous sinuses: Diffusely patent Anatomic variants: None significant Review of the MIP images confirms the above findings CT Brain Perfusion Findings: ASPECTS: 9 CBF (<30%) Volume: 77mL Perfusion (Tmax>6.0s) volume: 41mL Mismatch Volume: 28mL IMPRESSION: 1. No emergent large vessel occlusion. 2. 17 cc ischemic area in the lateral left frontal region, some of which is completed infarct by noncontrast CT. 3. Mild atheromatous changes without proximal flow limiting stenosis or embolic source. Electronically Signed   By: Marnee Spring M.D.   On: 01/23/2021 05:59   MR BRAIN WO CONTRAST  Result Date: 01/23/2021 CLINICAL DATA:  Acute stroke. EXAM: MRI HEAD WITHOUT CONTRAST TECHNIQUE: Multiplanar, multiecho pulse sequences of the brain and surrounding structures were obtained without intravenous contrast. COMPARISON:  CT and CTA with CT perfusion from earlier  today FINDINGS: Brain: Restricted diffusion in the posterior and lateral left frontal cortex and subjacent white matter. This is the area that was positive on noncontrast head CT. Less prominent FLAIR signal in this area but there are is early detectable swelling and cortical hyperintensity on a few slices. Two subcentimeter areas are seen at the MCA watershed zone at the frontal and occipital lobes, too small for FLAIR correlation on this motion degraded scan. Background chronic small  vessel ischemia. Small remote left inferior cerebellar infarct. Remote micro hemorrhages in the right thalamus and right cerebellum. Vascular: Normal flow voids. Skull and upper cervical spine: Negative Sinuses/Orbits: Retention cysts in the left maxillary sinus. Other: Progressively motion degraded. IMPRESSION: 1. Band of restricted diffusion in the lateral left frontal lobe. FLAIR changes are less apparent but this area has the appearance of completed infarct by CT. 2. Smaller areas of altered diffusion at the border zone of the left MCA territory. 3. Background chronic small vessel ischemia. 4. Progressively motion degraded study. Electronically Signed   By: Marnee Spring M.D.   On: 01/23/2021 06:50   CT CEREBRAL PERFUSION W CONTRAST  Result Date: 01/23/2021 CLINICAL DATA:  Stroke-like symptoms EXAM: CT ANGIOGRAPHY HEAD AND NECK CT PERFUSION BRAIN TECHNIQUE: Multidetector CT imaging of the head and neck was performed using the standard protocol during bolus administration of intravenous contrast. Multiplanar CT image reconstructions and MIPs were obtained to evaluate the vascular anatomy. Carotid stenosis measurements (when applicable) are obtained utilizing NASCET criteria, using the distal internal carotid diameter as the denominator. Multiphase CT imaging of the brain was performed following IV bolus contrast injection. Subsequent parametric perfusion maps were calculated using RAPID software. CONTRAST:  Dose is currently  not known on this in progress study COMPARISON:  Contemporaneous head CT FINDINGS: CTA NECK FINDINGS Aortic arch: No acute finding. Mild atheromatous wall thickening. Three vessel branching. Right carotid system: Vessels are smooth and widely patent with no significant atheromatous changes. Left carotid system: Vessels are smooth and widely patent. Mild atheromatous plaque at the bifurcation. Vertebral arteries: No proximal subclavian stenosis. Codominant vertebral arteries that are smooth and widely patent to the dura. Skeleton: No acute finding. Dental caries with periapical erosions and torus mandibularis. Other neck: Dense calcification in the superficial left parotid. Upper chest: Clustered small pulmonary nodules in the right upper lobe which have an inflammatory pattern. Review of the MIP images confirms the above findings CTA HEAD FINDINGS Anterior circulation: Atheromatous plaque on the carotid siphons. No branch occlusion, beading, or proximal flow limiting stenosis. Posterior circulation: Vertebral and basilar arteries are smooth and widely patent. No proximal branch occlusion, beading, or aneurysm. Venous sinuses: Diffusely patent Anatomic variants: None significant Review of the MIP images confirms the above findings CT Brain Perfusion Findings: ASPECTS: 9 CBF (<30%) Volume: 0mL Perfusion (Tmax>6.0s) volume: 17mL Mismatch Volume: 17mL IMPRESSION: 1. No emergent large vessel occlusion. 2. 17 cc ischemic area in the lateral left frontal region, some of which is completed infarct by noncontrast CT. 3. Mild atheromatous changes without proximal flow limiting stenosis or embolic source. Electronically Signed   By: Marnee Spring M.D.   On: 01/23/2021 05:59   ECHOCARDIOGRAM COMPLETE  Result Date: 01/23/2021    ECHOCARDIOGRAM REPORT   Patient Name:   CALUM CORMIER Date of Exam: 01/23/2021 Medical Rec #:  [id number]     Height: Accession #:    [id number]    Weight:       133.2 lb Date of Birth:  02-26-1961       BSA:          1.540 m Patient Age:    60 years      BP:           157/94 mmHg Patient Gender: M             HR:           94 bpm. Exam Location:  Inpatient Procedure: 2D Echo Indications:  stroke  History:        Patient has no prior history of Echocardiogram examinations.  Sonographer:    Delcie Roch Referring Phys: [phone] RACHAL A Alwin IMPRESSIONS  1. Left ventricular ejection fraction, by estimation, is 25 to 30%. The left ventricle has severely decreased function. The left ventricle demonstrates global hypokinesis. The left ventricular internal cavity size was severely dilated. There is mild asymmetric left ventricular hypertrophy of the basal and septal segments. Left ventricular diastolic parameters are consistent with Grade I diastolic dysfunction (impaired relaxation).  2. Right ventricular systolic function is normal. The right ventricular size is normal.  3. Left atrial size was moderately dilated.  4. The mitral valve is normal in structure. Mild mitral valve regurgitation. No evidence of mitral stenosis.  5. The aortic valve is tricuspid. There is mild calcification of the aortic valve. Aortic valve regurgitation is not visualized. Mild aortic valve sclerosis is present, with no evidence of aortic valve stenosis.  6. Aortic dilatation noted. There is mild dilatation of the aortic root, measuring 40 mm.  7. The inferior vena cava is normal in size with greater than 50% respiratory variability, suggesting right atrial pressure of 3 mmHg. FINDINGS  Left Ventricle: Left ventricular ejection fraction, by estimation, is 25 to 30%. The left ventricle has severely decreased function. The left ventricle demonstrates global hypokinesis. The left ventricular internal cavity size was severely dilated. There is mild asymmetric left ventricular hypertrophy of the basal and septal segments. Left ventricular diastolic parameters are consistent with Grade I diastolic dysfunction (impaired relaxation). Right  Ventricle: The right ventricular size is normal. No increase in right ventricular wall thickness. Right ventricular systolic function is normal. Left Atrium: Left atrial size was moderately dilated. Right Atrium: Right atrial size was not well visualized. Pericardium: There is no evidence of pericardial effusion. Mitral Valve: The mitral valve is normal in structure. Mild mitral valve regurgitation. No evidence of mitral valve stenosis. Tricuspid Valve: The tricuspid valve is normal in structure. Tricuspid valve regurgitation is trivial. No evidence of tricuspid stenosis. Aortic Valve: The aortic valve is tricuspid. There is mild calcification of the aortic valve. Aortic valve regurgitation is not visualized. Mild aortic valve sclerosis is present, with no evidence of aortic valve stenosis. Pulmonic Valve: The pulmonic valve was normal in structure. Pulmonic valve regurgitation is not visualized. No evidence of pulmonic stenosis. Aorta: The aortic root is normal in size and structure and aortic dilatation noted. There is mild dilatation of the aortic root, measuring 40 mm. Venous: The inferior vena cava is normal in size with greater than 50% respiratory variability, suggesting right atrial pressure of 3 mmHg. IAS/Shunts: No atrial level shunt detected by color flow Doppler.  LEFT VENTRICLE PLAX 2D LVIDd:         5.90 cm      Diastology LVIDs:         5.50 cm      LV e' medial:    3.59 cm/s LV PW:         1.30 cm      LV E/e' medial:  19.2 LV IVS:        1.30 cm      LV e' lateral:   5.66 cm/s LVOT diam:     2.10 cm      LV E/e' lateral: 12.2 LV SV:         33 LV SV Index:   21 LVOT Area:     3.46 cm  LV Volumes (MOD)  LV vol d, MOD A4C: 191.0 ml LV vol s, MOD A4C: 150.0 ml LV SV MOD A4C:     191.0 ml RIGHT VENTRICLE             IVC RV S prime:     14.30 cm/s  IVC diam: 1.30 cm TAPSE (M-mode): 2.1 cm LEFT ATRIUM             Index       RIGHT ATRIUM           Index LA diam:        4.50 cm 2.92 cm/m  RA Area:      13.20 cm LA Vol (A2C):   72.7 ml 47.22 ml/m RA Volume:   28.60 ml  18.58 ml/m LA Vol (A4C):   48.2 ml 31.31 ml/m LA Biplane Vol: 63.7 ml 41.37 ml/m  AORTIC VALVE LVOT Vmax:   65.60 cm/s LVOT Vmean:  40.300 cm/s LVOT VTI:    0.096 m  AORTA Ao Root diam: 4.00 cm Ao Asc diam:  3.10 cm MV E velocity: 69.00 cm/s MV A velocity: 96.80 cm/s  SHUNTS MV E/A ratio:  0.71        Systemic VTI:  0.10 m                            Systemic Diam: 2.10 cm Charlton HawsPeter Nishan MD Electronically signed by Charlton HawsPeter Nishan MD Signature Date/Time: 01/23/2021/11:39:58 AM    Final    CT HEAD CODE STROKE WO CONTRAST  Result Date: 01/23/2021 CLINICAL DATA:  Code stroke.  Aphasia and right facial droop. EXAM: CT HEAD WITHOUT CONTRAST TECHNIQUE: Contiguous axial images were obtained from the base of the skull through the vertex without intravenous contrast. COMPARISON:  None. FINDINGS: Brain: Small area of gray-white matter differentiation loss along the high and posterior left frontal cortex. No acute hemorrhage, hydrocephalus, or masslike finding. Small remote left cerebellar infarct. Vascular: No definite hyperdense vessel in all planes. Skull: Normal. Negative for fracture or focal lesion. Sinuses/Orbits: Gaze to the left. Other: These results were communicated to Dr. Iver NestleBhagat at 5:43 amon 4/9/2022by text page via the [facility] messaging system. ASPECTS [facility]([place] Stroke Program Early CT Score) - Ganglionic level infarction (caudate, lentiform nuclei, internal capsule, insula, M1-M3 cortex): 7 - Supraganglionic infarction (M4-M6 cortex): 2 Total score (0-10 with 10 being normal): 9 IMPRESSION: 1. 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 2. No acute hemorrhage. 3. Small remote left cerebellar infarct. Electronically Signed   By: Marnee SpringJonathon  Watts M.D.   On: 01/23/2021 05:44    Assessment and Plan:   1. New cardiomyopathy: -Pt presented with acute CVA with aphasia and right sided facial droop with MRI imaging showing left  frontal MCA branch infarct>>CT angiogram shows no large vessel stenosis or occlusion.  -Unfortunately echocardiogram shows significantly reduced LVEF at 25 to 30% with global hypokinesis, LVH and G1DD.  -Per neuro, allowing permissive HTN for the next 24-48H then can plan for BP normalization  -Creatinine stable at 1.00 therefore once ok with neurology will add Entresto and spironolactone for cardiomyopathy  -Will need further ischemic workup with R/LHC to ensure no coronary etiology for significant LV dysfunction once more stable from a neuro standpoint.  -Will send message to schedule TEE   2. Left MCA CVA: -Pt had acute onset of aphasia with right facial droop>>cod stoke initiated>>not a candidate for TPA. [facility] with left MCA  stroke. CTA with no significant atherosclerosis. HbA1c stable at 5.6 with LDL at 85.  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 HTN: -BP markedly elevated on ED arrival with SBPs in the 170-180 range -Plan for permissive HTN for 24-48H post acute CVA then gradual normalization per neurology request.  -Once ok with neuro, will plan to add Entresto, spironolactone to regimen   4. HLD: -LDL, 85 with LDL goal <70 given CVA -Plan for statin therapy     For questions or updates, please contact [facility] Please consult [email or web address] for contact info under Cardiology/STEMI.   Raliegh Ip NP-C [facility] Pager: [phone] 01/24/2021 10:42 AM   Attending Note:   The patient was seen and examined.  Agree with assessment and plan as noted above.  Changes made to the above note as needed.  Patient seen and independently examined with  Georgie Chard, NP.   We discussed all aspects of the encounter. I  agree with the assessment and plan as stated above.  1.   Newly diagnosed acute on chronic combined CHF:   Acute location is difficult with Mr. Chismar because of a recent stroke.  The history will be somewhat incomplete because of the lack of our ability to communicate.  He has no history of chest pains and no real history of shortness of breath.  Echocardiogram was performed following a stroke in revealed severe LV dysfunction.  He is never had any cardiac problems.  He will obviously need to be started on CHF medications.  Neurology currently is allowing for passive hypertension.  We will start entresto, spiro, coreg once we get the OK from neuro .   I suspect that he has had longstanding hypertension as a cause of his acute on chronic combined systolic and diastolic congestive heart failure.  He will need a right left heart catheterization once he is improved.    2.  HTN:  Will add meds for his CHF ad HTN when we are able    I have spent a total of 40 minutes with patient reviewing hospital  notes , telemetry, EKGs, labs and examining patient as well as establishing an assessment and plan that was discussed with the patient. > 50% of time was spent in direct patient care.     Vesta Mixer, Montez Hageman., MD, [facility] 01/24/2021, 12:27 PM [address],  [address] Office [phone] Pager [phone]

## 2021-01-24 NOTE — Evaluation (Signed)
Speech Language Pathology Evaluation Patient Details Name: Todd Vega MRN: [id number] DOB: [birth year] Today's Date: 01/24/2021 Time: 4268-3419 SLP Time Calculation (min) (ACUTE ONLY): 22 min  Problem List:  Patient Active Problem List   Diagnosis Date Noted  . Acute CVA (cerebrovascular accident) (HCC) 01/23/2021  . HTN (hypertension) 01/23/2021  . CVA (cerebral vascular accident) (HCC) 01/23/2021   Past Medical History: No past medical history on file. Past Surgical History: The histories are not reviewed yet. Please review them in the "History" navigator section and refresh this SmartLink. HPI:  Todd Vega is a 60 y.o. male with no significant  past medical history comes in after waking up in the middle night and waking up his wife for aphasia and right-sided facial droop. MRI brain reveals smaller areas of altered diffusion at the border zone of the left MCA territory as well as small remote left cerebellar infarct. PMH includes no medical care in 5+ years, questionable alcohol abuse per sister who he lives with, hypertension.   Assessment / Plan / Recommendation Clinical Impression  Todd Vega demonstrates a mild receptive and moderate-severe expressive aphasia with suspected co-occurring dysarthria (to be further evaluated). Pt with good insight into deficits and obviously frustrated by inability to communicate effectively. Visual inattention R impacting ability to effectively utilize communication board/reading. Given cues to read across board, pt was able to follow written directions. Yes/no and following directions are impaired at complex level. Basic comprehension appears intact. Expression is greatly limited by imprecision of articulation and phonemic paraphasias (versus true word finding deficits). Written expression is severely impaired, but appears related to RUE weakness. Cognition appears WNL, however, not formally assessed given severity of speech/language impairment. See  scores below.  [place] Aphasia Screening Test: Naming: 10/10 (distorted production of "thumb" to "um") Automatic Speech: 8/10 Repetition: 7/10 Yes/No Responses: 18/20 Object Recognition in a Field of Five: 10/10 Following Instructions: 8/10 Reading Instructions: 8/10 Verbal Fluency Dictation: 3/10 Writing/Spelling: 2/10 Expressive Subscale: 30/50 Receptive Subscale: 36/50 Total Score: 66/100    SLP Assessment  SLP Recommendation/Assessment: Patient needs continued Speech Lanaguage Pathology Services SLP Visit Diagnosis: Dysphagia, oral phase (R13.11);Aphasia (R47.01);Dysarthria and anarthria (R47.1)    Follow Up Recommendations  24 hour supervision/assistance;Home health SLP    Frequency and Duration min 2x/week  2 weeks      SLP Evaluation Cognition  Overall Cognitive Status: Within Functional Limits for tasks assessed Arousal/Alertness: Awake/alert Orientation Level: Oriented X4 Attention: Focused;Sustained Focused Attention: Appears intact Sustained Attention: Appears intact Memory: Appears intact Awareness: Appears intact Problem Solving: Appears intact Executive Function: Initiating;Self Monitoring Initiating: Appears intact Self Monitoring: Appears intact Safety/Judgment: Appears intact       Comprehension  Auditory Comprehension Overall Auditory Comprehension: Impaired Yes/No Questions: Impaired Complex Questions: 75-100% accurate Commands: Impaired Complex Commands: 75-100% accurate Conversation: Complex Visual Recognition/Discrimination Discrimination: Exceptions to [facility] Reading Comprehension Reading Status: Within funtional limits    Expression Expression Primary Mode of Expression: Verbal Verbal Expression Overall Verbal Expression: Impaired Initiation: No impairment Level of Generative/Spontaneous Verbalization: Word;Phrase Repetition: Impaired Level of Impairment: Phrase level;Sentence level Naming: Impairment Responsive: 76-100%  accurate Confrontation: Within functional limits Convergent: 75-100% accurate Divergent: 50-74% accurate Verbal Errors: Phonemic paraphasias;Aware of errors Pragmatics: No impairment Interfering Components: Speech intelligibility Effective Techniques: Sentence completion;Phonemic cues Non-Verbal Means of Communication: Gestures;Writing;Communication board Other Verbal Expression Comments: + for visual neglect R impacting use of reading/communication board acc'y Written Expression Dominant Hand: Right Written Expression: Exceptions to [facility]   Oral / Motor  Oral Motor/Sensory Function Overall Oral Motor/Sensory  Function: Moderate impairment Facial ROM: Reduced right;Suspected CN VII (facial) dysfunction Facial Symmetry: Abnormal symmetry right Facial Strength: Reduced right Facial Sensation: Within Functional Limits Lingual ROM: Reduced right Lingual Symmetry: Abnormal symmetry right Lingual Strength: Reduced Velum: Within Functional Limits Mandible: Within Functional Limits                      Dickie Labarre P. Eitan Doubleday, M.S., CCC-SLP Speech-Language Pathologist Acute Rehabilitation Services Pager: [phone]  Susanne Borders Marki Frede 01/24/2021, 10:20 AM

## 2021-01-24 NOTE — Progress Notes (Addendum)
STROKE TEAM PROGRESS NOTE    Interval History   No acute events overnight, patient resting in bed, exam notable for improving aphasia, primary team recommended to consult cardiology given echo findings of heart failure.   Pertinent Lab Work and Imaging    01/23/21 CT Head WO IV Contrast 1. Small acute cortical infarct in the left frontal region. ASPECTS is 9. 2. No acute hemorrhage. 3. Small remote left cerebellar infarct.  01/23/21 CT Angio Head and Neck W WO IV Contrast 1. No emergent large vessel occlusion. 2. 17 cc ischemic area in the lateral left frontal region, some of which is completed infarct by noncontrast CT. 3. Mild atheromatous changes without proximal flow limiting stenosis or embolic source.  01/23/21 MRI Brain WO IV Contrast 1. Band of restricted diffusion in the lateral left frontal lobe. FLAIR changes are less apparent but this area has the appearance of completed infarct by CT. 2. Smaller areas of altered diffusion at the border zone of the left MCA territory. 3. Background chronic small vessel ischemia. 4. Progressively motion degraded study.  01/23/21 Echocardiogram Complete  1. Left ventricular ejection fraction, by estimation, is 25 to 30%. The left ventricle has severely decreased function. The left ventricle demonstrates global hypokinesis. The left ventricular internal cavity size was severely dilated. There is mild asymmetric left ventricular hypertrophy of the basal and septal segments.  Left ventricular diastolic parameters are consistent with Grade I diastolic dysfunction (impaired relaxation).  2. Right ventricular systolic function is normal. The right ventricular size is normal.  3. Left atrial size was moderately dilated.  4. The mitral valve is normal in structure. Mild mitral valve regurgitation. No evidence of mitral stenosis.  5. The aortic valve is tricuspid. There is mild calcification of the aortic valve. Aortic valve regurgitation is not  visualized. Mild aortic valve sclerosis is present, with no evidence of aortic valve stenosis.  6. Aortic dilatation noted. There is mild dilatation of the aortic root, measuring 40 mm.  7. The inferior vena cava is normal in size with greater than 50% respiratory variability, suggesting right atrial pressure of 3 mmHg.   Physical Examination   Constitutional: Frail middle-aged African-American male not in distress. Cardiovascular: Normal RR Respiratory: No increased WOB   Mental status: Answers LOC questions correctly with choices provided, oriented x4, following commands  Speech: Dense expressive aphasia- able to name all objects today and repeat. Struggles to describe stroke card w/picture and continues to have impaired fluency. Good comprehension- follows all commands. Severely dysarthric  Cranial nerves: EOMI, VFF, Face appears symmetric, tongue midline  Motor: Normal bulk and tone. Antigravity throughout with no drift.  Diminished fine finger movements on the right.  Orbits left over right upper extremity.  Mild right grip weakness. Sensory: Intact to light tough throughout  Coordination: Intact FNF  Gait: Deferred   NIHSS  1a Level of Conscious: 0  1b LOC Questions: 0 1c LOC Commands: 0 2 Best Gaze: 0 3 Visual: 0 4 Facial Palsy: 0 5a Motor Arm - left: 0 5b Motor Arm - Right: 0 6a Motor Leg - Left: 0 6b Motor Leg - Right: 0 7 Limb Ataxia: 0 8 Sensory: 0 9 Best Language: 2 10 Dysarthria: 1 11 Extinct. and Inatten: 0 TOTAL: 3  Assessment and Plan   [name] is a 60 y.o. male w/pmh of alcohol use and medical non compliance who presents with a L MCA Stroke. He was outside of the time window for IVTPA and did not  qualify for thrombectomy due to lack of LVO.   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 Stroke work up was initiated. MRI Brain w/ L MCA stroke. CTA Head and Neck w/no significant atherosclerosis.  Echocardiogram w/reduced EF of 25 to 30 %, moderately dilated left atrium, left ventricle with global hypokinesis. Stroke labs w/A1C 5.6, LDL 85. The etiology of his stroke is likely cardioembolic given heart failure noted on echocardiogram. Need further cardic work up with TEE to evaluate for LV thrombus.  - DAPT for 21 days then Aspirin monotherapy  - Continue Atorvastatin 40 mg for secondary stroke prevention  - Appreciate cardiology consult by primary team; cardiology has sent a message to schedule TEE, planning for TEE with possible loop implantation versus 30 day cardiac event monitor  - Stroke clinic follow up at discharge   #Hypertension Has hypertension, SBP trending 160-180 currently. Recommend permissive HTN for 24-48 hours post stroke then gradually lower SBP.  - Ok with Cardiology initiating Entresto and Spironolactone on 01/25/21 as he will be 48 hours out from stroke at that point   #Hyperlipidemia From a stroke prevention stand point, the LDL goal is < 70. LDL 40 mg initiated 01/24/21. Lower dose selected given LDL is 85, close to goal.   Hospital day # 1  Stark Jock, NP  [facility] Nurse Practitioner Patient seen and discussed with attending physician Dr. Pearlean Brownie    Continue dual antiplatelet therapy of aspirin Plavix for 3 weeks followed by aspirin alone.  Cardiology consult to help manage his congestive heart failure.  Check TEE to look for left atrial clot and consider long-term cardiac monitoring for paroxysmal atrial fibrillation unless cardiology feels patient is a candidate for defibrillator.  Aggressive risk factor modification.  Greater than 50% time during this 25-minute visit was spent in counseling and coordination of care about his embolic stroke and discussion about evaluation and treatment and discussion with care team.  Delia Heady, MD Medical Director [facility] Pager: [phone] 01/24/2021 9:20 AM    To contact Stroke  Continuity provider, please refer to [email or web address]. After hours, contact General Neurology

## 2021-01-24 NOTE — Progress Notes (Signed)
PROGRESS NOTE    Todd Vega  WUJ:[id number] DOB: 08/27/1961 DOA: 01/23/2021 PCP: No primary care provider on file.    Chief Complaint  Patient presents with  . Code Stroke    Brief Narrative:  Todd Vega is a 60 y.o. male with no significant  past medical history comes in after waking up in the middle night and waking up his wife for aphasia and right-sided facial droop.  Patient has not seen a physician in over 5 years and has no known medical issues except allergies for which he takes Allegra.  He denies any weakness numbness or tingling in his extremities.  Code stroke was called teleneurology has seen the patient.  Not a TPA candidate.  Patient does indeed in fact show an infarct in MCA territory.  Pt seen and examined at bedside.   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 - admitted for further evaluation.  Neurology on board. Started her on aspirin and plavix.  LDL is 85,  Start lipitor 40 mg daily. hemoglobin A1c is 5.6 Permissive hypertension.  Cardiology consulted for TEE.  echocardiogram showed severe LV dysfunction.  Cardiology consulted, for combined systolic and diastolic heart failure.  Therapy evaluation recommending outpatient PT/OT .    Alcohol abuse:  Alcohol level at 42. Watch for withdrawal symptoms.  TOC will be consulted for substance abuse.    Hypertension:  Permissive Hypertension.       DVT prophylaxis:SCD'S Code Status: full code.  Family Communication: (none at bedside. ) Disposition:   Status is: Inpatient  Remains inpatient appropriate because:Ongoing diagnostic testing needed not appropriate for outpatient work up   Dispo: The patient is from: Home              Anticipated d/c is to: Home              Patient currently is not  medically stable to d/c.   Difficult to place patient No       Consultants:   Neurology  Cardiology   Procedures: (MRI of the brain  Echocardiogram  Antimicrobials:  None.    Subjective: NO CHEST PAIN Or sob, no nausea, vomiting or abd pain.   Objective: Vitals:   01/23/21 1939 01/23/21 2359 01/24/21 0400 01/24/21 0738  BP: (!) 187/115 (!) 187/120 (!) 168/99 (!) (P) 160/116  Pulse: (!) 101 96 87 (P) 99  Resp: 17 17 20  (P) 20  Temp: 98.3 F (36.8 C) 98.3 F (36.8 C) 98.5 F (36.9 C) (P) 98.2 F (36.8 C)  TempSrc: Axillary Oral Oral (P) Oral  SpO2: 99% 100% 99% (P) 100%  Weight:        Intake/Output Summary (Last 24 hours) at 01/24/2021 0918 Last data filed at 01/24/2021 0600 Gross per 24 hour  Intake 318.42 ml  Output 300 ml  Net 18.42 ml   Filed Weights   01/23/21 0556  Weight: 60.4 kg    Examination:  General exam: Appears calm and comfortable  Respiratory system: Clear to auscultation. Respiratory effort normal. Cardiovascular system: S1 & S2 heard, RRR. No JVD, . No pedal edema. Gastrointestinal system: Abdomen is nondistended, soft and nontender.  Normal bowel sounds heard. Central nervous system: Alert and oriented, aphasic,dysarthric.  Extremities: no edema.  Skin: No rashes, lesions or ulcers Psychiatry: Mood & affect appropriate.  Data Reviewed: I have personally reviewed following labs and imaging studies  CBC: Recent Labs  Lab 01/23/21 0531 01/23/21 0535  WBC 3.3*  --   NEUTROABS 2.1  --   HGB 14.5 17.0  HCT 44.3 50.0  MCV 79.0*  --   PLT 154  --     Basic Metabolic Panel: Recent Labs  Lab 01/23/21 0531 01/23/21 0535 01/23/21 0851  NA 133* 135  --   K 4.1 4.1  --   CL 100 103  --   CO2 20*  --   --   GLUCOSE 116* 112*  --   BUN 14 16  --   CREATININE 1.09 1.00  --   CALCIUM 8.8*  --   --   MG  --   --  1.5*  PHOS  --   --  3.2    GFR: CrCl cannot be calculated (Unknown ideal weight.).  Liver Function  Tests: Recent Labs  Lab 01/23/21 0531  AST 34  ALT 18  ALKPHOS 71  BILITOT 0.3  PROT 7.7  ALBUMIN 3.6    CBG: Recent Labs  Lab 01/23/21 0530  GLUCAP 117*     Recent Results (from the past 240 hour(s))  Resp Panel by RT-PCR (Flu A&B, Covid) Nasopharyngeal Swab     Status: None   Collection Time: 01/23/21  5:31 AM   Specimen: Nasopharyngeal Swab; Nasopharyngeal(NP) swabs in vial transport medium  Result Value Ref Range Status   SARS Coronavirus 2 by RT PCR NEGATIVE NEGATIVE Final    Comment: (NOTE) SARS-CoV-2 target nucleic acids are NOT DETECTED.  The SARS-CoV-2 RNA is generally detectable in upper respiratory specimens during the acute phase of infection. The lowest concentration of SARS-CoV-2 viral copies this assay can detect is 138 copies/mL. A negative result does not preclude SARS-Cov-2 infection and should not be used as the sole basis for treatment or other patient management decisions. A negative result may occur with  improper specimen collection/handling, submission of specimen other than nasopharyngeal swab, presence of viral mutation(s) within the areas targeted by this assay, and inadequate number of viral copies(<138 copies/mL). A negative result must be combined with clinical observations, patient history, and epidemiological information. The expected result is Negative.  Fact Sheet for Patients:  BloggerCourse.com  Fact Sheet for Healthcare Providers:  SeriousBroker.it  This test is no t yet approved or cleared by the [place] FDA and  has been authorized for detection and/or diagnosis of SARS-CoV-2 by FDA under an Emergency Use Authorization (EUA). This EUA will remain  in effect (meaning this test can be used) for the duration of the COVID-19 declaration under Section 564(b)(1) of the Act, 21 U.S.C.section 360bbb-3(b)(1), unless the authorization is terminated  or revoked sooner.        Influenza A by PCR NEGATIVE NEGATIVE Final   Influenza B by PCR NEGATIVE NEGATIVE Final    Comment: (NOTE) The Xpert Xpress SARS-CoV-2/FLU/RSV plus assay is intended as an aid in the diagnosis of influenza from Nasopharyngeal swab specimens and should not be used as a sole basis for treatment. Nasal washings and aspirates are unacceptable for Xpert Xpress SARS-CoV-2/FLU/RSV testing.  Fact Sheet for Patients: BloggerCourse.com  Fact Sheet for Healthcare Providers: SeriousBroker.it  This test is not yet approved or cleared by the [place] FDA and has been authorized for detection and/or diagnosis of SARS-CoV-2 by FDA under an Emergency Use Authorization (EUA). This EUA will remain in effect (meaning this test can be used) for the  duration of the COVID-19 declaration under Section 564(b)(1) of the Act, 21 U.S.C. section 360bbb-3(b)(1), unless the authorization is terminated or revoked.  Performed at [facility], [address] [place], [place] [address]          Radiology Studies: CT ANGIO HEAD W OR WO CONTRAST  Result Date: 01/23/2021 CLINICAL DATA:  Stroke-like symptoms EXAM: CT ANGIOGRAPHY HEAD AND NECK CT PERFUSION BRAIN TECHNIQUE: Multidetector CT imaging of the head and neck was performed using the standard protocol during bolus administration of intravenous contrast. Multiplanar CT image reconstructions and MIPs were obtained to evaluate the vascular anatomy. Carotid stenosis measurements (when applicable) are obtained utilizing NASCET criteria, using the distal internal carotid diameter as the denominator. Multiphase CT imaging of the brain was performed following IV bolus contrast injection. Subsequent parametric perfusion maps were calculated using RAPID software. CONTRAST:  Dose is currently not known on this in progress study COMPARISON:  Contemporaneous head CT FINDINGS: CTA NECK FINDINGS Aortic arch: No acute  finding. Mild atheromatous wall thickening. Three vessel branching. Right carotid system: Vessels are smooth and widely patent with no significant atheromatous changes. Left carotid system: Vessels are smooth and widely patent. Mild atheromatous plaque at the bifurcation. Vertebral arteries: No proximal subclavian stenosis. Codominant vertebral arteries that are smooth and widely patent to the dura. Skeleton: No acute finding. Dental caries with periapical erosions and torus mandibularis. Other neck: Dense calcification in the superficial left parotid. Upper chest: Clustered small pulmonary nodules in the right upper lobe which have an inflammatory pattern. Review of the MIP images confirms the above findings CTA HEAD FINDINGS Anterior circulation: Atheromatous plaque on the carotid siphons. No branch occlusion, beading, or proximal flow limiting stenosis. Posterior circulation: Vertebral and basilar arteries are smooth and widely patent. No proximal branch occlusion, beading, or aneurysm. Venous sinuses: Diffusely patent Anatomic variants: None significant Review of the MIP images confirms the above findings CT Brain Perfusion Findings: ASPECTS: 9 CBF (<30%) Volume: 53mL Perfusion (Tmax>6.0s) volume: 88mL Mismatch Volume: 43mL IMPRESSION: 1. No emergent large vessel occlusion. 2. 17 cc ischemic area in the lateral left frontal region, some of which is completed infarct by noncontrast CT. 3. Mild atheromatous changes without proximal flow limiting stenosis or embolic source. Electronically Signed   By: Marnee Spring M.D.   On: 01/23/2021 05:59   CT ANGIO NECK W OR WO CONTRAST  Result Date: 01/23/2021 CLINICAL DATA:  Stroke-like symptoms EXAM: CT ANGIOGRAPHY HEAD AND NECK CT PERFUSION BRAIN TECHNIQUE: Multidetector CT imaging of the head and neck was performed using the standard protocol during bolus administration of intravenous contrast. Multiplanar CT image reconstructions and MIPs were obtained to evaluate  the vascular anatomy. Carotid stenosis measurements (when applicable) are obtained utilizing NASCET criteria, using the distal internal carotid diameter as the denominator. Multiphase CT imaging of the brain was performed following IV bolus contrast injection. Subsequent parametric perfusion maps were calculated using RAPID software. CONTRAST:  Dose is currently not known on this in progress study COMPARISON:  Contemporaneous head CT FINDINGS: CTA NECK FINDINGS Aortic arch: No acute finding. Mild atheromatous wall thickening. Three vessel branching. Right carotid system: Vessels are smooth and widely patent with no significant atheromatous changes. Left carotid system: Vessels are smooth and widely patent. Mild atheromatous plaque at the bifurcation. Vertebral arteries: No proximal subclavian stenosis. Codominant vertebral arteries that are smooth and widely patent to the dura. Skeleton: No acute finding. Dental caries with periapical erosions and torus mandibularis. Other neck: Dense calcification in the  superficial left parotid. Upper chest: Clustered small pulmonary nodules in the right upper lobe which have an inflammatory pattern. Review of the MIP images confirms the above findings CTA HEAD FINDINGS Anterior circulation: Atheromatous plaque on the carotid siphons. No branch occlusion, beading, or proximal flow limiting stenosis. Posterior circulation: Vertebral and basilar arteries are smooth and widely patent. No proximal branch occlusion, beading, or aneurysm. Venous sinuses: Diffusely patent Anatomic variants: None significant Review of the MIP images confirms the above findings CT Brain Perfusion Findings: ASPECTS: 9 CBF (<30%) Volume: 41mL Perfusion (Tmax>6.0s) volume: 91mL Mismatch Volume: 61mL IMPRESSION: 1. No emergent large vessel occlusion. 2. 17 cc ischemic area in the lateral left frontal region, some of which is completed infarct by noncontrast CT. 3. Mild atheromatous changes without proximal flow  limiting stenosis or embolic source. Electronically Signed   By: Marnee Spring M.D.   On: 01/23/2021 05:59   MR BRAIN WO CONTRAST  Result Date: 01/23/2021 CLINICAL DATA:  Acute stroke. EXAM: MRI HEAD WITHOUT CONTRAST TECHNIQUE: Multiplanar, multiecho pulse sequences of the brain and surrounding structures were obtained without intravenous contrast. COMPARISON:  CT and CTA with CT perfusion from earlier today FINDINGS: Brain: Restricted diffusion in the posterior and lateral left frontal cortex and subjacent white matter. This is the area that was positive on noncontrast head CT. Less prominent FLAIR signal in this area but there are is early detectable swelling and cortical hyperintensity on a few slices. Two subcentimeter areas are seen at the MCA watershed zone at the frontal and occipital lobes, too small for FLAIR correlation on this motion degraded scan. Background chronic small vessel ischemia. Small remote left inferior cerebellar infarct. Remote micro hemorrhages in the right thalamus and right cerebellum. Vascular: Normal flow voids. Skull and upper cervical spine: Negative Sinuses/Orbits: Retention cysts in the left maxillary sinus. Other: Progressively motion degraded. IMPRESSION: 1. Band of restricted diffusion in the lateral left frontal lobe. FLAIR changes are less apparent but this area has the appearance of completed infarct by CT. 2. Smaller areas of altered diffusion at the border zone of the left MCA territory. 3. Background chronic small vessel ischemia. 4. Progressively motion degraded study. Electronically Signed   By: Marnee Spring M.D.   On: 01/23/2021 06:50   CT CEREBRAL PERFUSION W CONTRAST  Result Date: 01/23/2021 CLINICAL DATA:  Stroke-like symptoms EXAM: CT ANGIOGRAPHY HEAD AND NECK CT PERFUSION BRAIN TECHNIQUE: Multidetector CT imaging of the head and neck was performed using the standard protocol during bolus administration of intravenous contrast. Multiplanar CT image  reconstructions and MIPs were obtained to evaluate the vascular anatomy. Carotid stenosis measurements (when applicable) are obtained utilizing NASCET criteria, using the distal internal carotid diameter as the denominator. Multiphase CT imaging of the brain was performed following IV bolus contrast injection. Subsequent parametric perfusion maps were calculated using RAPID software. CONTRAST:  Dose is currently not known on this in progress study COMPARISON:  Contemporaneous head CT FINDINGS: CTA NECK FINDINGS Aortic arch: No acute finding. Mild atheromatous wall thickening. Three vessel branching. Right carotid system: Vessels are smooth and widely patent with no significant atheromatous changes. Left carotid system: Vessels are smooth and widely patent. Mild atheromatous plaque at the bifurcation. Vertebral arteries: No proximal subclavian stenosis. Codominant vertebral arteries that are smooth and widely patent to the dura. Skeleton: No acute finding. Dental caries with periapical erosions and torus mandibularis. Other neck: Dense calcification in the superficial left parotid. Upper chest: Clustered small pulmonary nodules in the right upper lobe which  have an inflammatory pattern. Review of the MIP images confirms the above findings CTA HEAD FINDINGS Anterior circulation: Atheromatous plaque on the carotid siphons. No branch occlusion, beading, or proximal flow limiting stenosis. Posterior circulation: Vertebral and basilar arteries are smooth and widely patent. No proximal branch occlusion, beading, or aneurysm. Venous sinuses: Diffusely patent Anatomic variants: None significant Review of the MIP images confirms the above findings CT Brain Perfusion Findings: ASPECTS: 9 CBF (<30%) Volume: 0mL Perfusion (Tmax>6.0s) volume: 17mL Mismatch Volume: 17mL IMPRESSION: 1. No emergent large vessel occlusion. 2. 17 cc ischemic area in the lateral left frontal region, some of which is completed infarct by noncontrast CT.  3. Mild atheromatous changes without proximal flow limiting stenosis or embolic source. Electronically Signed   By: Marnee Spring M.D.   On: 01/23/2021 05:59   ECHOCARDIOGRAM COMPLETE  Result Date: 01/23/2021    ECHOCARDIOGRAM REPORT   Patient Name:   Todd Vega Date of Exam: 01/23/2021 Medical Rec #:  [id number]     Height: Accession #:    [id number]    Weight:       133.2 lb Date of Birth:  1961-10-05      BSA:          1.540 m Patient Age:    60 years      BP:           157/94 mmHg Patient Gender: M             HR:           94 bpm. Exam Location:  Inpatient Procedure: 2D Echo Indications:    stroke  History:        Patient has no prior history of Echocardiogram examinations.  Sonographer:    Delcie Roch Referring Phys: [phone] RACHAL A Cahlil IMPRESSIONS  1. Left ventricular ejection fraction, by estimation, is 25 to 30%. The left ventricle has severely decreased function. The left ventricle demonstrates global hypokinesis. The left ventricular internal cavity size was severely dilated. There is mild asymmetric left ventricular hypertrophy of the basal and septal segments. Left ventricular diastolic parameters are consistent with Grade I diastolic dysfunction (impaired relaxation).  2. Right ventricular systolic function is normal. The right ventricular size is normal.  3. Left atrial size was moderately dilated.  4. The mitral valve is normal in structure. Mild mitral valve regurgitation. No evidence of mitral stenosis.  5. The aortic valve is tricuspid. There is mild calcification of the aortic valve. Aortic valve regurgitation is not visualized. Mild aortic valve sclerosis is present, with no evidence of aortic valve stenosis.  6. Aortic dilatation noted. There is mild dilatation of the aortic root, measuring 40 mm.  7. The inferior vena cava is normal in size with greater than 50% respiratory variability, suggesting right atrial pressure of 3 mmHg. FINDINGS  Left Ventricle: Left ventricular ejection  fraction, by estimation, is 25 to 30%. The left ventricle has severely decreased function. The left ventricle demonstrates global hypokinesis. The left ventricular internal cavity size was severely dilated. There is mild asymmetric left ventricular hypertrophy of the basal and septal segments. Left ventricular diastolic parameters are consistent with Grade I diastolic dysfunction (impaired relaxation). Right Ventricle: The right ventricular size is normal. No increase in right ventricular wall thickness. Right ventricular systolic function is normal. Left Atrium: Left atrial size was moderately dilated. Right Atrium: Right atrial size was not well visualized. Pericardium: There is no evidence of pericardial effusion. Mitral Valve: The mitral valve is normal in  structure. Mild mitral valve regurgitation. No evidence of mitral valve stenosis. Tricuspid Valve: The tricuspid valve is normal in structure. Tricuspid valve regurgitation is trivial. No evidence of tricuspid stenosis. Aortic Valve: The aortic valve is tricuspid. There is mild calcification of the aortic valve. Aortic valve regurgitation is not visualized. Mild aortic valve sclerosis is present, with no evidence of aortic valve stenosis. Pulmonic Valve: The pulmonic valve was normal in structure. Pulmonic valve regurgitation is not visualized. No evidence of pulmonic stenosis. Aorta: The aortic root is normal in size and structure and aortic dilatation noted. There is mild dilatation of the aortic root, measuring 40 mm. Venous: The inferior vena cava is normal in size with greater than 50% respiratory variability, suggesting right atrial pressure of 3 mmHg. IAS/Shunts: No atrial level shunt detected by color flow Doppler.  LEFT VENTRICLE PLAX 2D LVIDd:         5.90 cm      Diastology LVIDs:         5.50 cm      LV e' medial:    3.59 cm/s LV PW:         1.30 cm      LV E/e' medial:  19.2 LV IVS:        1.30 cm      LV e' lateral:   5.66 cm/s LVOT diam:      2.10 cm      LV E/e' lateral: 12.2 LV SV:         33 LV SV Index:   21 LVOT Area:     3.46 cm  LV Volumes (MOD) LV vol d, MOD A4C: 191.0 ml LV vol s, MOD A4C: 150.0 ml LV SV MOD A4C:     191.0 ml RIGHT VENTRICLE             IVC RV S prime:     14.30 cm/s  IVC diam: 1.30 cm TAPSE (M-mode): 2.1 cm LEFT ATRIUM             Index       RIGHT ATRIUM           Index LA diam:        4.50 cm 2.92 cm/m  RA Area:     13.20 cm LA Vol (A2C):   72.7 ml 47.22 ml/m RA Volume:   28.60 ml  18.58 ml/m LA Vol (A4C):   48.2 ml 31.31 ml/m LA Biplane Vol: 63.7 ml 41.37 ml/m  AORTIC VALVE LVOT Vmax:   65.60 cm/s LVOT Vmean:  40.300 cm/s LVOT VTI:    0.096 m  AORTA Ao Root diam: 4.00 cm Ao Asc diam:  3.10 cm MV E velocity: 69.00 cm/s MV A velocity: 96.80 cm/s  SHUNTS MV E/A ratio:  0.71        Systemic VTI:  0.10 m                            Systemic Diam: 2.10 cm Charlton Haws MD Electronically signed by Charlton Haws MD Signature Date/Time: 01/23/2021/11:39:58 AM    Final    CT HEAD CODE STROKE WO CONTRAST  Result Date: 01/23/2021 CLINICAL DATA:  Code stroke.  Aphasia and right facial droop. EXAM: CT HEAD WITHOUT CONTRAST TECHNIQUE: Contiguous axial images were obtained from the base of the skull through the vertex without intravenous contrast. COMPARISON:  None. FINDINGS: Brain: Small area of gray-white matter differentiation loss along the high and  posterior left frontal cortex. No acute hemorrhage, hydrocephalus, or masslike finding. Small remote left cerebellar infarct. Vascular: No definite hyperdense vessel in all planes. Skull: Normal. Negative for fracture or focal lesion. Sinuses/Orbits: Gaze to the left. Other: These results were communicated to Dr. Iver Nestle at 5:43 amon 4/9/2022by text page via the [facility] messaging system. ASPECTS [facility] Stroke Program Early CT Score) - Ganglionic level infarction (caudate, lentiform nuclei, internal capsule, insula, M1-M3 cortex): 7 - Supraganglionic infarction (M4-M6 cortex): 2 Total  score (0-10 with 10 being normal): 9 IMPRESSION: 1. 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 2. No acute hemorrhage. 3. Small remote left cerebellar infarct. Electronically Signed   By: Marnee Spring M.D.   On: 01/23/2021 05:44        Scheduled Meds: . clopidogrel  75 mg Oral Daily  . folic acid  1 mg Oral Daily  . multivitamin with minerals  1 tablet Oral Daily  . sodium chloride flush  3 mL Intravenous Q12H  . [START ON 01/26/2021] thiamine injection  100 mg Intravenous Daily   Continuous Infusions: . sodium chloride Stopped (01/24/21 0545)  . thiamine injection Stopped (01/24/21 0553)     LOS: 1 day        Kathlen Mody, MD [facility]   To contact the attending provider between 7A-7P or the covering provider during after hours 7P-7A, please log into the web site [email or web address] and access using universal [place] password for that web site. If you do not have the password, please call the hospital operator.  01/24/2021, 9:18 AM

## 2021-01-24 NOTE — Evaluation (Signed)
Clinical/Bedside Swallow Evaluation Patient Details  Name: Todd Vega MRN: [id number] Date of Birth: [birth year]  Today's Date: 01/24/2021 Time: SLP Start Time (ACUTE ONLY): 8182 SLP Stop Time (ACUTE ONLY): 0933 SLP Time Calculation (min) (ACUTE ONLY): 11 min  Past Medical History: No past medical history on file. Past Surgical History: No past surgical history on file. HPI:  Todd Vega is a 60 y.o. male with no significant  past medical history comes in after waking up in the middle night and waking up his sister for aphasia and right-sided facial droop. MRI brain reveals smaller areas of altered diffusion at the border zone of the left MCA territory as well as small remote left cerebellar infarct. PMH includes no medical care in 5+ years, questionable alcohol abuse per sister who he lives with, hypertension.   Assessment / Plan / Recommendation Clinical Impression  Pt demonstrates a moderate oral dysphagia characterized by reduced lingual ROM and significantly prolonged A/P transit, resulting in oral residue of solids. RN reports this is improved compared to yesterday. He has reduced R sided facial strength/ROM and labial seal, which results in trace-mild anterior spillage of thin liquids. Spillage resolves with straw placed on R. He also had not eaten much breakfast, stating he wasn't hungry and it "wasn't worth it." Pt with obvious trouble masticating bread on tray. Recommend downgrade to Dysphagia 3 and continue thin liquids. Pt would likely benefit from further downgrade of solids, however, declined, stating he wouldn't eat it.   ST to follow up for compensatory strategies (straw for liquid intake on strong side, alternate solids/liquids, utilize lingual sweep for oral clearance) and upgrade as appropriate.   SLP Visit Diagnosis: Dysphagia, oral phase (R13.11)    Aspiration Risk  Mild aspiration risk    Diet Recommendation Dysphagia 3 (Mech soft);Thin liquid   Liquid  Administration via: Straw Medication Administration: Whole meds with liquid Supervision: Intermittent supervision to cue for compensatory strategies (visual needs (could not see food on R side of tray)) Compensations: Slow rate;Small sips/bites;Lingual sweep for clearance of pocketing Postural Changes: Remain upright for at least 30 minutes after po intake;Seated upright at 90 degrees    Other  Recommendations Oral Care Recommendations: Oral care QID   Follow up Recommendations 24 hour supervision/assistance      Frequency and Duration min 2x/week  2 weeks       Prognosis Prognosis for Safe Diet Advancement: Good Barriers to Reach Goals: Language deficits      Swallow Study   General Date of Onset: 01/23/21 HPI: Todd Vega is a 60 y.o. male with no significant  past medical history comes in after waking up in the middle night and waking up his wife for aphasia and right-sided facial droop. MRI brain reveals smaller areas of altered diffusion at the border zone of the left MCA territory as well as small remote left cerebellar infarct. PMH includes no medical care in 5+ years, questionable alcohol abuse per sister who he lives with, hypertension. Type of Study: Bedside Swallow Evaluation Previous Swallow Assessment: Passed Yale swallow screening with no s/s pharyngeal dysphagia; RN concern for oral dysphagia Diet Prior to this Study: Regular;Thin liquids Temperature Spikes Noted: No Respiratory Status: Room air History of Recent Intubation: No Behavior/Cognition: Alert;Cooperative;Pleasant mood Oral Cavity Assessment: Other (comment) (oral residue diffuse) Oral Care Completed by SLP: Yes Oral Cavity - Dentition: Adequate natural dentition;Poor condition Vision: Impaired for self-feeding Self-Feeding Abilities: Needs assist Patient Positioning: Upright in bed Baseline Vocal Quality: Normal Volitional Cough: Strong Volitional Swallow:  Able to elicit    Oral/Motor/Sensory  Function Overall Oral Motor/Sensory Function: Moderate impairment Facial ROM: Reduced right;Suspected CN VII (facial) dysfunction Facial Symmetry: Abnormal symmetry right Facial Strength: Reduced right Facial Sensation: Within Functional Limits Lingual ROM: Reduced right Lingual Symmetry: Abnormal symmetry right Lingual Strength: Reduced Velum: Within Functional Limits Mandible: Within Functional Limits   Ice Chips Ice chips: Not tested   Thin Liquid Thin Liquid: Impaired Presentation: Straw Oral Phase Impairments: Reduced labial seal Oral Phase Functional Implications: Right anterior spillage Other Comments: educated to put straw on strong side--resolved spillage    Puree Puree: Within functional limits Presentation: Spoon   Solid     Solid: Impaired Oral Phase Impairments: Reduced lingual movement/coordination;Impaired mastication Oral Phase Functional Implications: Oral residue     Todd Vega P. Todd Vega, M.S., CCC-SLP Speech-Language Pathologist Acute Rehabilitation Services Pager: [phone]  Todd Vega P Todd Vega 01/24/2021,9:57 AM

## 2021-01-25 ENCOUNTER — Encounter (HOSPITAL_COMMUNITY)
Admission: EM | Disposition: A | Discharge status: Home / Self Care | Payer: Self-pay | Source: Home / Self Care | Attending: Internal Medicine

## 2021-01-25 ENCOUNTER — Inpatient Hospital Stay (HOSPITAL_COMMUNITY): Payer: Self-pay | Admitting: Certified Registered Nurse Anesthetist

## 2021-01-25 ENCOUNTER — Other Ambulatory Visit (HOSPITAL_COMMUNITY): Payer: Self-pay

## 2021-01-25 ENCOUNTER — Inpatient Hospital Stay (HOSPITAL_COMMUNITY): Payer: Self-pay

## 2021-01-25 ENCOUNTER — Encounter (HOSPITAL_COMMUNITY): Payer: Self-pay | Admitting: Family Medicine

## 2021-01-25 DIAGNOSIS — I340 Nonrheumatic mitral (valve) insufficiency: Secondary | ICD-10-CM

## 2021-01-25 DIAGNOSIS — I639 Cerebral infarction, unspecified: Secondary | ICD-10-CM

## 2021-01-25 DIAGNOSIS — I420 Dilated cardiomyopathy: Secondary | ICD-10-CM

## 2021-01-25 HISTORY — PX: BUBBLE STUDY: SHX6837

## 2021-01-25 HISTORY — PX: TEE WITHOUT CARDIOVERSION: SHX5443

## 2021-01-25 LAB — ECHO TEE
AV Peak grad: 2.5 mmHg
Ao pk vel: 0.79 m/s

## 2021-01-25 SURGERY — ECHOCARDIOGRAM, TRANSESOPHAGEAL
Anesthesia: Monitor Anesthesia Care

## 2021-01-25 MED ORDER — PROPOFOL 10 MG/ML IV BOLUS
INTRAVENOUS | Status: DC | PRN
  Administered 2021-01-25: 20 mg via INTRAVENOUS
  Administered 2021-01-25: 25 mg via INTRAVENOUS
  Administered 2021-01-25: 30 mg via INTRAVENOUS

## 2021-01-25 MED ORDER — LIDOCAINE 2% (20 MG/ML) 5 ML SYRINGE
INTRAMUSCULAR | Status: DC | PRN
  Administered 2021-01-25: 60 mg via INTRAVENOUS

## 2021-01-25 MED ORDER — SACUBITRIL-VALSARTAN 24-26 MG PO TABS
1.0000 | ORAL_TABLET | Freq: Two times a day (BID) | ORAL | Status: DC
  Administered 2021-01-25 – 2021-01-28 (×7): 1 via ORAL
  Filled 2021-01-25 (×8): qty 1

## 2021-01-25 MED ORDER — PROPOFOL 500 MG/50ML IV EMUL
INTRAVENOUS | Status: DC | PRN
  Administered 2021-01-25: 100 ug/kg/min via INTRAVENOUS

## 2021-01-25 MED ORDER — SODIUM CHLORIDE 0.9 % IV SOLN: INTRAVENOUS | Status: DC | PRN

## 2021-01-25 NOTE — Anesthesia Procedure Notes (Signed)
Procedure Name: MAC Date/Time: 01/25/2021 12:09 PM Performed by: Colin Benton, CRNA Pre-anesthesia Checklist: Emergency Drugs available, Patient identified, Suction available and Patient being monitored Patient Re-evaluated:Patient Re-evaluated prior to induction Oxygen Delivery Method: Nasal cannula Induction Type: IV induction Airway Equipment and Method: Bite block Placement Confirmation: positive ETCO2 Dental Injury: Teeth and Oropharynx as per pre-operative assessment

## 2021-01-25 NOTE — Progress Notes (Signed)
Physical Therapy Treatment Patient Details Name: Todd Vega MRN: [id number] DOB: 05/04/1961 Today's Date: 01/25/2021    History of Present Illness 60 yo male admitted 01/23/21 with R side weakness, R facial droop and aphasia. imaging (+) L frontal (brocas area) MCA infarct remote L cerebellar infarct.  PMH: none.    PT Comments    Pt scored 22/24 on the DGI and is ambulating at a mod I level in his room and in the hallway.  His primary deficits are related to his speech.  Pt has met all PT goals and PT is d/c from therapy.    Follow Up Recommendations  No PT follow up     Equipment Recommendations  None recommended by PT    Recommendations for Other Services       Precautions / Restrictions Precautions Precautions: None    Mobility  Bed Mobility Overal bed mobility: Modified Independent                  Transfers Overall transfer level: Independent                  Ambulation/Gait Ambulation/Gait assistance: Modified independent (Device/Increase time) Gait Distance (Feet): 500 Feet Assistive device: None Gait Pattern/deviations: Step-through pattern;Staggering left;Staggering right Gait velocity: Decr Gait velocity interpretation: 1.31 - 2.62 ft/sec, indicative of limited community ambulator General Gait Details: very mild staggering gait pattern when challenged   Stairs Stairs: Yes Stairs assistance: Modified independent (Device/Increase time) Stair Management: One rail Right;Forwards;Alternating pattern Number of Stairs: 10 General stair comments: reciprocal pattern, good speed light rail use   Wheelchair Mobility    Modified Rankin (Stroke Patients Only) Modified Rankin (Stroke Patients Only) Pre-Morbid Rankin Score: No symptoms Modified Rankin: Moderate disability     Balance Overall balance assessment: Mild deficits observed, not formally tested                               Standardized Balance Assessment Standardized  Balance Assessment : Dynamic Gait Index   Dynamic Gait Index Level Surface: Normal Change in Gait Speed: Normal Gait with Horizontal Head Turns: Normal Gait with Vertical Head Turns: Normal Gait and Pivot Turn: Normal Step Over Obstacle: Mild Impairment Step Around Obstacles: Mild Impairment Steps: Normal Total Score: 22      Cognition Arousal/Alertness: Awake/alert Behavior During Therapy: WFL for tasks assessed/performed Overall Cognitive Status: Difficult to assess                                        Exercises      General Comments        Pertinent Vitals/Pain Pain Assessment: No/denies pain    Home Living                      Prior Function            PT Goals (current goals can now be found in the care plan section) Acute Rehab PT Goals Patient Stated Goal: to go home PT Goal Formulation: All assessment and education complete, DC therapy Progress towards PT goals: Goals met/education completed, patient discharged from PT    Frequency    Min 4X/week      PT Plan Current plan remains appropriate    Co-evaluation              AM-PAC PT "  6 Clicks" Mobility   Outcome Measure  Help needed turning from your back to your side while in a flat bed without using bedrails?: None Help needed moving from lying on your back to sitting on the side of a flat bed without using bedrails?: None Help needed moving to and from a bed to a chair (including a wheelchair)?: None Help needed standing up from a chair using your arms (e.g., wheelchair or bedside chair)?: None Help needed to walk in hospital room?: None Help needed climbing 3-5 steps with a railing? : None 6 Click Score: 24    End of Session Equipment Utilized During Treatment: Gait belt Activity Tolerance: Patient tolerated treatment well Patient left: in bed;with call bell/phone within reach   PT Visit Diagnosis: Difficulty in walking, not elsewhere classified  (R26.2)     Time: 8852-0740 PT Time Calculation (min) (ACUTE ONLY): 13 min  Charges:  $Gait Training: 8-22 mins            Verdene Lennert, PT, DPT  Acute Rehabilitation [phone] pager [phone]) [phone] office

## 2021-01-25 NOTE — Progress Notes (Signed)
OT Cancellation Note  Patient Details Name: Todd Vega MRN: [id number] DOB: 07/09/1961   Cancelled Treatment:    Reason Eval/Treat Not Completed: Patient at procedure or test/ unavailable  Pt now back from TEE but now requires cardiac procedure where pt must remain in bed. Therapy will continue to follow.   Pollyann Glen E. Chanette Demo, COTA/L Acute Rehabilitation Services [phone]  Cherlyn Cushing 01/25/2021, 1:43 PM

## 2021-01-25 NOTE — Progress Notes (Addendum)
STROKE TEAM PROGRESS NOTE    Interval History   No acute events overnight, patient resting in bed, exam notable for improving aphasia, cardiology sent message regarding need for TEE yesterday   Pertinent Lab Work and Imaging    01/23/21 CT Head WO IV Contrast 1. Small acute cortical infarct in the left frontal region. ASPECTS is 9. 2. No acute hemorrhage. 3. Small remote left cerebellar infarct.  01/23/21 CT Angio Head and Neck W WO IV Contrast 1. No emergent large vessel occlusion. 2. 17 cc ischemic area in the lateral left frontal region, some of which is completed infarct by noncontrast CT. 3. Mild atheromatous changes without proximal flow limiting stenosis or embolic source.  01/23/21 MRI Brain WO IV Contrast 1. Band of restricted diffusion in the lateral left frontal lobe. FLAIR changes are less apparent but this area has the appearance of completed infarct by CT. 2. Smaller areas of altered diffusion at the border zone of the left MCA territory. 3. Background chronic small vessel ischemia. 4. Progressively motion degraded study.  01/23/21 Echocardiogram Complete  1. Left ventricular ejection fraction, by estimation, is 25 to 30%. The left ventricle has severely decreased function. The left ventricle demonstrates global hypokinesis. The left ventricular internal cavity size was severely dilated. There is mild asymmetric left ventricular hypertrophy of the basal and septal segments.  Left ventricular diastolic parameters are consistent with Grade I diastolic dysfunction (impaired relaxation).  2. Right ventricular systolic function is normal. The right ventricular size is normal.  3. Left atrial size was moderately dilated.  4. The mitral valve is normal in structure. Mild mitral valve regurgitation. No evidence of mitral stenosis.  5. The aortic valve is tricuspid. There is mild calcification of the aortic valve. Aortic valve regurgitation is not visualized. Mild aortic valve  sclerosis is present, with no evidence of aortic valve stenosis.  6. Aortic dilatation noted. There is mild dilatation of the aortic root, measuring 40 mm.  7. The inferior vena cava is normal in size with greater than 50% respiratory variability, suggesting right atrial pressure of 3 mmHg.   Physical Examination   Constitutional: Frail middle-aged African-American male not in distress. Cardiovascular: Normal RR Respiratory: No increased WOB   Mental status: Answers LOC questions correctly with choices provided, oriented x4, following commands  Speech: Dense expressive aphasia- able to name all objects today and repeat. Struggles to describe stroke card w/picture and continues to have impaired fluency. Good comprehension- follows all commands. Severely dysarthric  Cranial nerves: Left gaze preference able to cross midline, VFF, Face appears symmetric, tongue midline  Motor: Normal bulk and tone. Antigravity throughout with no drift.  Diminished fine finger movements on the right.  Orbits left over right upper extremity.  Mild right grip weakness. Sensory: Intact to light tough throughout  Coordination: Intact FNF  Gait: Deferred   NIHSS  1a Level of Conscious: 0  1b LOC Questions: 0 1c LOC Commands: 0 2 Best Gaze: 1 3 Visual: 0 4 Facial Palsy: 0 5a Motor Arm - left: 0 5b Motor Arm - Right: 0 6a Motor Leg - Left: 0 6b Motor Leg - Right: 0 7 Limb Ataxia: 0 8 Sensory: 0 9 Best Language: 2 10 Dysarthria: 1 11 Extinct. and Inatten: 0 TOTAL: 4  Assessment and Plan   Todd Vega is a 60 y.o. male w/pmh of alcohol use and medical non compliance who presents with a L MCA Stroke. He was outside of the time window for IVTPA and  did not qualify for thrombectomy due to lack of LVO.   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 Stroke work up was initiated. MRI Brain w/ L MCA stroke. CTA Head and Neck w/no significant  atherosclerosis. Echocardiogram w/reduced EF of 25 to 30 %, moderately dilated left atrium, left ventricle with global hypokinesis. Stroke labs w/A1C 5.6, LDL 85. The etiology of his stroke is likely cardioembolic given heart failure noted on echocardiogram. Need further cardic work up with TEE to evaluate for LV thrombus.  - DAPT for 21 days then Aspirin monotherapy  - Continue Atorvastatin 20 mg for secondary stroke prevention  - Appreciate cardiology consult by primary team; cardiology has sent a message to schedule TEE, planning for TEE with possible loop implantation versus 30 day cardiac event monitor  - F/U TEE results  - Stroke clinic follow up at discharge   #Hypertension  Has hypertension, SBP trending 160-180 currently. Recommend permissive HTN for 24-48 hours post stroke then gradually lower SBP. He is out of the permissive hypertension window as of 4/11 therefore SBP goal now is < 180.   #Cardiomyopathy, new diagnosis  His echo this admission revealed reduced EF of 25-30 % moderately dilated left atrium, left ventricle with global hypokinesis. Primary team has consulted cardiology to assist with management of newly diagnosed cardiomyopathy + TEE planning.  - Ok for Cardiology to initiate Entresto and Spironolactone today from a neuro perspective.   #Hyperlipidemia From a stroke prevention stand point, the LDL goal is < 70. Atorvastatin 20 mg initiated 01/24/21. Lower dose selected given LDL is 85, close to goal.   Hospital day # 2  Stark Jock, NP  [facility] Nurse Practitioner Patient seen and discussed with attending physician Dr. Pearlean Brownie    Continue dual antiplatelet therapy of aspirin Plavix for 3 weeks followed by aspirin alone.  Cardiology consult to help manage his congestive heart failure.  Check TEE to look for left atrial clot and consider long-term cardiac monitoring for paroxysmal atrial fibrillation unless cardiology feels patient is a candidate for  defibrillator.  Aggressive risk factor modification.  Greater than 50% time during this 25-minute visit was spent in counseling and coordination of care about his embolic stroke and discussion about evaluation and treatment and discussion with care team.  Delia Heady, MD Medical Director [facility] Pager: [phone] 01/25/2021 8:12 AM    To contact Stroke Continuity provider, please refer to [email or web address]. After hours, contact General Neurology

## 2021-01-25 NOTE — Progress Notes (Signed)
VASCULAR LAB    TCD with bubble has been performed.  See CV proc for preliminary results.   Gavinn Collard, RVT 01/25/2021, 2:13 PM

## 2021-01-25 NOTE — Anesthesia Postprocedure Evaluation (Signed)
Anesthesia Post Note  Patient: Ugo Thoma  Procedure(s) Performed: TRANSESOPHAGEAL ECHOCARDIOGRAM (TEE) (N/A ) BUBBLE STUDY     Patient location during evaluation: PACU Anesthesia Type: MAC Level of consciousness: awake and alert Pain management: pain level controlled Vital Signs Assessment: post-procedure vital signs reviewed and stable Respiratory status: spontaneous breathing, nonlabored ventilation, respiratory function stable and patient connected to nasal cannula oxygen Cardiovascular status: stable and blood pressure returned to baseline Postop Assessment: no apparent nausea or vomiting Anesthetic complications: no Comments: Normalizing BP per primary team.    No complications documented.  Last Vitals:  Vitals:   01/25/21 1247 01/25/21 1256  BP: (!) 146/89 (!) 157/112  Pulse: (!) 104 96  Resp: 18 19  Temp:    SpO2: 99% 98%    Last Pain:  Vitals:   01/25/21 1256  TempSrc:   PainSc: 0-No pain                 Effie Berkshire

## 2021-01-25 NOTE — TOC CAGE-AID Note (Signed)
Transition of Care [facility]) - CAGE-AID Screening   Patient Details  Name: Todd Vega MRN: [id number] Date of Birth: 12/05/1960  [facility]) CM/SW Contact:    Kermit Balo, RN Phone Number: 01/25/2021, 10:43 AM   Clinical Narrative: Patient offered outpatient/ inpatient drug/alcohol abuse resources and he refused.    CAGE-AID Screening:    Have You Ever Felt You Ought to Cut Down on Your Drinking or Drug Use?: Yes Have People Annoyed You By Critizing Your Drinking Or Drug Use?: No Have You Felt Bad Or Guilty About Your Drinking Or Drug Use?: Yes Have You Ever Had a Drink or Used Drugs First Thing In The Morning to Steady Your Nerves or to Get Rid of a Hangover?: No CAGE-AID Score: 2  Substance Abuse Education Offered: Yes (pt refused)

## 2021-01-25 NOTE — TOC Initial Note (Addendum)
Transition of Care [facility]) - Initial/Assessment Note    Patient Details  Name: Todd Vega MRN: [id number] Date of Birth: 1961-10-11  Transition of [facility]) CM/SW Contact:    Pollie Friar, RN Phone Number: 01/25/2021, 10:45 AM  Clinical Narrative:                 Patient lives at home with his sister who doesn't drive. Pt is hoping he will be able to drive soon. He is in agreement with outpatient therapy through [facility]. Orders in Epic and information on the AVS. Pt is without insurance. CM inquired about a PcP through the [facility] system and pt in agreement. Appt obtained and placed on the AVS. CM also entered information about [facility] pharmacy to assist with the cost of his medications.  Pt will most likely need transport home when d/ced.  CM also following for medication assistance.  1115: CM met with the patient again to get his phone number. Pt unable to use his phone to call CM or to find his number. CM spoke to patient sister and she is in agreement that [facility] services may be better at this time. CM has sent referral to [facility] with [facility] who is doing charity HH.    Expected Discharge Plan: OP Rehab Barriers to Discharge: Continued Medical Work up,Inadequate or no insurance   Patient Goals and CMS Choice     Choice offered to / list presented to : Patient  Expected Discharge Plan and Services Expected Discharge Plan: OP Rehab   Discharge Planning Services: CM Consult   Living arrangements for the past 2 months: Single Family Home                                      Prior Living Arrangements/Services Living arrangements for the past 2 months: Single Family Home Lives with:: Siblings (sister) Patient language and need for interpreter reviewed:: Yes Do you feel safe going back to the place where you live?: Yes      Need for Family Participation in Patient Care: No (Comment)     Criminal Activity/Legal Involvement Pertinent to Current  Situation/Hospitalization: No - Comment as needed  Activities of Daily Living      Permission Sought/Granted                  Emotional Assessment Appearance:: Appears stated age Attitude/Demeanor/Rapport: Engaged (aphasia but able to get some words out and used white board) Affect (typically observed): Accepting Orientation: : Oriented to Self,Oriented to Place,Oriented to  Time,Oriented to Situation Alcohol / Substance Use: Alcohol Use,Illicit Drugs Psych Involvement: No (comment)  Admission diagnosis:  Hyponatremia [E87.1] Elevated blood-pressure reading without diagnosis of hypertension [R03.0] CVA (cerebral vascular accident) ([place]) [I63.9] Cerebrovascular accident (CVA), unspecified mechanism ([place]) [I63.9] Leukopenia, unspecified type [D72.819] Patient Active Problem List   Diagnosis Date Noted  . Acute CVA (cerebrovascular accident) ([place]) 01/23/2021  . HTN (hypertension) 01/23/2021  . CVA (cerebral vascular accident) ([place]) 01/23/2021   PCP:  No primary care provider on file. Pharmacy:   [facility] [place], [place] - [address] [place] [address] Phone: [phone] Fax: [phone]     Social Determinants of Health (SDOH) Interventions    Readmission Risk Interventions No flowsheet data found.

## 2021-01-25 NOTE — Progress Notes (Signed)
STROKE TEAM PROGRESS NOTE    Interval History   No acute events overnight, patient resting in bed,  He just returned from TEE which showed no cardiac source of embolism or PFO.  He has been seen by cardiology recommend starting Entresto and spironolactone for his cardiomyopathy and congestive heart failure.  He may need  heart cath at some point in the future Neurological exam is unchanged.  Vital signs are stable. Pertinent Lab Work and Imaging    01/23/21 CT Head WO IV Contrast 1. Small acute cortical infarct in the left frontal region. ASPECTS is 9. 2. No acute hemorrhage. 3. Small remote left cerebellar infarct.  01/23/21 CT Angio Head and Neck W WO IV Contrast 1. No emergent large vessel occlusion. 2. 17 cc ischemic area in the lateral left frontal region, some of which is completed infarct by noncontrast CT. 3. Mild atheromatous changes without proximal flow limiting stenosis or embolic source.  01/23/21 MRI Brain WO IV Contrast 1. Band of restricted diffusion in the lateral left frontal lobe. FLAIR changes are less apparent but this area has the appearance of completed infarct by CT. 2. Smaller areas of altered diffusion at the border zone of the left MCA territory. 3. Background chronic small vessel ischemia. 4. Progressively motion degraded study.  01/23/21 Echocardiogram Complete  1. Left ventricular ejection fraction, by estimation, is 25 to 30%. The left ventricle has severely decreased function. The left ventricle demonstrates global hypokinesis. The left ventricular internal cavity size was severely dilated. There is mild asymmetric left ventricular hypertrophy of the basal and septal segments.  Left ventricular diastolic parameters are consistent with Grade I diastolic dysfunction (impaired relaxation).  2. Right ventricular systolic function is normal. The right ventricular size is normal.  3. Left atrial size was moderately dilated.  4. The mitral valve is normal in  structure. Mild mitral valve regurgitation. No evidence of mitral stenosis.  5. The aortic valve is tricuspid. There is mild calcification of the aortic valve. Aortic valve regurgitation is not visualized. Mild aortic valve sclerosis is present, with no evidence of aortic valve stenosis.  6. Aortic dilatation noted. There is mild dilatation of the aortic root, measuring 40 mm.  7. The inferior vena cava is normal in size with greater than 50% respiratory variability, suggesting right atrial pressure of 3 mmHg.   Physical Examination   Constitutional: Frail middle-aged African-American male not in distress. Cardiovascular: Normal RR Respiratory: No increased WOB   Mental status: Answers LOC questions correctly with choices provided, oriented x4, following commands  Speech: Dense expressive aphasia- able to name all objects today and repeat. Struggles to describe stroke card w/picture and continues to have impaired fluency. Good comprehension- follows all commands. Severely dysarthric  Cranial nerves: EOMI, VFF, Face appears symmetric, tongue midline  Motor: Normal bulk and tone. Antigravity throughout with no drift.  Diminished fine finger movements on the right.  Orbits left over right upper extremity.  Mild right grip weakness. Sensory: Intact to light tough throughout  Coordination: Intact FNF  Gait: Deferred   NIHSS  1a Level of Conscious: 0  1b LOC Questions: 0 1c LOC Commands: 0 2 Best Gaze: 0 3 Visual: 0 4 Facial Palsy: 0 5a Motor Arm - left: 0 5b Motor Arm - Right: 0 6a Motor Leg - Left: 0 6b Motor Leg - Right: 0 7 Limb Ataxia: 0 8 Sensory: 0 9 Best Language: 2 10 Dysarthria: 1 11 Extinct. and Inatten: 0 TOTAL: 3  Assessment and Plan  [name] is a 60 y.o. male w/pmh of alcohol use and medical non compliance who presents with a L MCA Stroke. He was outside of the time window for IVTPA and did not qualify for thrombectomy due to lack of LVO.   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 Stroke work up was initiated. MRI Brain w/ L MCA stroke. CTA Head and Neck w/no significant atherosclerosis. Echocardiogram w/reduced EF of 25 to 30 %, moderately dilated left atrium, left ventricle with global hypokinesis.  TEE shows reduced ejection fraction but no left atrial clot or other cardiac source of embolism.  Stroke labs w/A1C 5.6, LDL 85. The etiology of his stroke is likely cardioembolic given heart failure noted on echocardiogram.- DAPT for 21 days then Aspirin monotherapy  - Continue Atorvastatin 40 mg for secondary stroke prevention  - Appreciate cardiology consult and agree with loop recorder insertion at discharge for cardiac monitoring for paroxysmal A. fib. - Stroke clinic follow up at discharge   #Hypertension Has hypertension, SBP trending 160-180 currently. Recommend permissive HTN for 24-48 hours post stroke then gradually lower SBP.  - Ok with Cardiology initiating Entresto and Spironolactone on 01/25/21 as he will be 48 hours out from stroke at that point   #Hyperlipidemia From a stroke prevention stand point, the LDL goal is < 70. LDL 40 mg initiated 01/24/21. Lower dose selected given LDL is 85, close to goal.   Hospital day # 2     Continue dual antiplatelet therapy of aspirin Plavix for 3 weeks followed by aspirin alone.  Cardiology consult to help manage his congestive heart failure.  Check loop recorder at discharge for paroxysmal A. fib.  Check TCD bubble study to look for small PFO which could have been missed on TEE.  Aggressive risk factor modification.  Stroke team will sign off.  Follow-up as outpatient stroke clinic in 6 weeks.  Discussed with Dr. Blake Divine greater than 50% time during this 25-minute visit was spent in counseling and coordination of care about his embolic stroke and discussion about evaluation and treatment and discussion with care team.  Delia Heady, MD Medical  Director [facility] Pager: [phone] 01/25/2021 2:31 PM    To contact Stroke Continuity provider, please refer to [email or web address]. After hours, contact General Neurology

## 2021-01-25 NOTE — Progress Notes (Signed)
PROGRESS NOTE    Todd Vega  IRS:[id number] DOB: Mar 24, 1961 DOA: 01/23/2021 PCP: Pcp, No    Chief Complaint  Patient presents with  . Code Stroke    Brief Narrative:  Todd Vega is a 60 y.o. male with no significant  past medical history comes in after waking up in the middle night and waking up his wife for aphasia and right-sided facial droop.  Patient has not seen a physician in over 5 years and has no known medical issues except allergies for which he takes Allegra.  He denies any weakness numbness or tingling in his extremities.  Code stroke was called teleneurology has seen the patient.  Not a TPA candidate.  Patient does indeed in fact show an infarct in MCA territory. Pt seen and examined at bedside. No new complaints. Wants to know when he can go home.      Assessment & Plan:   Principal Problem:   Acute CVA (cerebrovascular accident) [facility]) Active Problems:   HTN (hypertension)   CVA (cerebral vascular accident) (HCC)  Acute MCA territory infarct on the left, possibly cardio embolic in nature. - admitted for further evaluation.  Neurology on board. Started him on aspirin and plavix for 21 days followed by aspirin use.  LDL is 85,  Start lipitor 40 mg daily. hemoglobin A1c is 5.6 Permissive hypertension.  Cardiology consulted for TEE. TEE done today and is negative for intraatrial shunt.  echocardiogram showed severe LV dysfunction, LVEF of 20 to 25%, global hypokinesis.  Waiting for loop recorder placement .  Cardiology consulted, for combined systolic and diastolic heart failure.  Therapy evaluation recommending outpatient PT/OT .    Alcohol abuse:  Alcohol level at 42. Watch for withdrawal symptoms.  TOC will be consulted for substance abuse.    Hypertension:  Permissive Hypertension.    Mild hyponatremia:  Asymptomatic.     Hypomagnesemia:  Replaced.    DVT prophylaxis:SCD'S Code Status: full code.  Family Communication: (none at bedside.  ) Disposition:   Status is: Inpatient  Remains inpatient appropriate because:Ongoing diagnostic testing needed not appropriate for outpatient work up   Dispo: The patient is from: Home              Anticipated d/c is to: Home              Patient currently is not medically stable to d/c.   Difficult to place patient No       Consultants:   Neurology  Cardiology   Procedures: (MRI of the brain  Echocardiogram  Antimicrobials:  None.    Subjective: Still dysarthric. No chest pain or sob.  Objective: Vitals:   01/25/21 1234 01/25/21 1247 01/25/21 1256 01/25/21 1400  BP: 116/77 (!) 146/89 (!) 157/112 (!) 157/105  Pulse: 100 (!) 104 96   Resp: (!) 21 18 19    Temp: 97.7 F (36.5 C)     TempSrc: Axillary     SpO2: 95% 99% 98%   Weight:        Intake/Output Summary (Last 24 hours) at 01/25/2021 1418 Last data filed at 01/25/2021 1235 Gross per 24 hour  Intake 103 ml  Output --  Net 103 ml   Filed Weights   01/23/21 0556  Weight: 60.4 kg    Examination:  General exam: alert and comfortable,  Respiratory system: clear to auscultation, no wheezing or rhonchi Cardiovascular system: S1-S2 heard, regular rate rhythm, no JVD, no pedal edema Gastrointestinal system: Abdomen is soft, nontender bowel sounds are  normal limits Central nervous system: Alert and oriented, dysarthric Extremities: No pedal edema Skin: No rashes seen  psychiatry: Mood is appropriate    Data Reviewed: I have personally reviewed following labs and imaging studies  CBC: Recent Labs  Lab 01/23/21 0531 01/23/21 0535  WBC 3.3*  --   NEUTROABS 2.1  --   HGB 14.5 17.0  HCT 44.3 50.0  MCV 79.0*  --   PLT 154  --     Basic Metabolic Panel: Recent Labs  Lab 01/23/21 0531 01/23/21 0535 01/23/21 0851 01/24/21 1105  NA 133* 135  --  133*  K 4.1 4.1  --  3.9  CL 100 103  --  100  CO2 20*  --   --  23  GLUCOSE 116* 112*  --  112*  BUN 14 16  --  14  CREATININE 1.09 1.00  --   1.20  CALCIUM 8.8*  --   --  9.1  MG  --   --  1.5*  --   PHOS  --   --  3.2  --     GFR: CrCl cannot be calculated (Unknown ideal weight.).  Liver Function Tests: Recent Labs  Lab 01/23/21 0531  AST 34  ALT 18  ALKPHOS 71  BILITOT 0.3  PROT 7.7  ALBUMIN 3.6    CBG: Recent Labs  Lab 01/23/21 0530  GLUCAP 117*     Recent Results (from the past 240 hour(s))  Resp Panel by RT-PCR (Flu A&B, Covid) Nasopharyngeal Swab     Status: None   Collection Time: 01/23/21  5:31 AM   Specimen: Nasopharyngeal Swab; Nasopharyngeal(NP) swabs in vial transport medium  Result Value Ref Range Status   SARS Coronavirus 2 by RT PCR NEGATIVE NEGATIVE Final    Comment: (NOTE) SARS-CoV-2 target nucleic acids are NOT DETECTED.  The SARS-CoV-2 RNA is generally detectable in upper respiratory specimens during the acute phase of infection. The lowest concentration of SARS-CoV-2 viral copies this assay can detect is 138 copies/mL. A negative result does not preclude SARS-Cov-2 infection and should not be used as the sole basis for treatment or other patient management decisions. A negative result may occur with  improper specimen collection/handling, submission of specimen other than nasopharyngeal swab, presence of viral mutation(s) within the areas targeted by this assay, and inadequate number of viral copies(<138 copies/mL). A negative result must be combined with clinical observations, patient history, and epidemiological information. The expected result is Negative.  Fact Sheet for Patients:  BloggerCourse.comhttps://www.fda.gov/media/152166/download  Fact Sheet for Healthcare Providers:  SeriousBroker.ithttps://www.fda.gov/media/152162/download  This test is no t yet approved or cleared by the [place] FDA and  has been authorized for detection and/or diagnosis of SARS-CoV-2 by FDA under an Emergency Use Authorization (EUA). This EUA will remain  in effect (meaning this test can be used) for the duration of  the COVID-19 declaration under Section 564(b)(1) of the Act, 21 U.S.C.section 360bbb-3(b)(1), unless the authorization is terminated  or revoked sooner.       Influenza A by PCR NEGATIVE NEGATIVE Final   Influenza B by PCR NEGATIVE NEGATIVE Final    Comment: (NOTE) The Xpert Xpress SARS-CoV-2/FLU/RSV plus assay is intended as an aid in the diagnosis of influenza from Nasopharyngeal swab specimens and should not be used as a sole basis for treatment. Nasal washings and aspirates are unacceptable for Xpert Xpress SARS-CoV-2/FLU/RSV testing.  Fact Sheet for Patients: BloggerCourse.comhttps://www.fda.gov/media/152166/download  Fact Sheet for Healthcare Providers: SeriousBroker.ithttps://www.fda.gov/media/152162/download  This test is not  yet approved or cleared by the [place] and has been authorized for detection and/or diagnosis of SARS-CoV-2 by FDA under an Emergency Use Authorization (EUA). This EUA will remain in effect (meaning this test can be used) for the duration of the COVID-19 declaration under Section 564(b)(1) of the Act, 21 U.S.C. section 360bbb-3(b)(1), unless the authorization is terminated or revoked.  Performed at [facility], [address] [place], [place] [address]          Radiology Studies: VAS [place] TRANSCRANIAL DOPPLER W BUBBLES  Result Date: 01/25/2021  Transcranial Doppler with Bubble Indications: Stroke. History: Small acute cortical infarct in the left frontal region. Comparison Study: No prior study on file Performing Technologist: Sherren Kerns RVS Supporting Technologist: Jean Rosenthal RDMS,RVT  Examination Guidelines: A complete evaluation includes B-mode imaging, spectral Doppler, color Doppler, and power Doppler as needed of all accessible portions of each vessel. Bilateral testing is considered an integral part of a complete examination. Limited examinations for reoccurring indications may be performed as noted.  Summary: No HITS at rest or during Valsalva.  Negative transcranial Doppler Bubble study with no evidence of right to left intracardiac communication.  *See table(s) above for TCD measurements and observations.    Preliminary    ECHO TEE  Result Date: 01/25/2021    TRANSESOPHOGEAL ECHO REPORT   Patient Name:   Todd Vega Date of Exam: 01/25/2021 Medical Rec #:  [id number]     Height: Accession #:    [id number]    Weight:       133.2 lb Date of Birth:  08/09/1961      BSA:          1.540 m Patient Age:    60 years      BP:           116/77 mmHg Patient Gender: M             HR:           100 bpm. Exam Location:  Inpatient Procedure: Transesophageal Echo, Cardiac Doppler, Color Doppler and Saline            Contrast Bubble Study Indications:     CVA  History:         Patient has prior history of Echocardiogram examinations, most                  recent 01/23/2021. Cardiomyopathy, Stroke; Risk                  Factors:Hypertension and Dyslipidemia.  Sonographer:     Lavenia Atlas Referring Phys:  [id number] Kathleene Hazel Diagnosing Phys: Jodelle Red MD PROCEDURE: After discussion of the risks and benefits of a TEE, an informed consent was obtained from the patient. The transesophogeal probe was passed without difficulty through the esophogus of the patient. Sedation performed by different physician. The patient was monitored while under deep sedation. Anesthestetic sedation was provided intravenously by Anesthesiology: 209mg  of Propofol, 60mg  of Lidocaine. Image quality was good. The patient's vital signs; including heart rate, blood pressure, and oxygen saturation; remained stable throughout the procedure. The patient developed no complications during the procedure. IMPRESSIONS  1. Left ventricular ejection fraction, by estimation, is 20 to 25%. The left ventricle has severely decreased function. The left ventricle demonstrates global hypokinesis. The left ventricular internal cavity size was severely dilated.  2. Right ventricular systolic  function is normal. The right ventricular size is normal.  3. Left atrial size  was moderately dilated. No left atrial/left atrial appendage thrombus was detected.  4. Right atrial size was mildly dilated.  5. The mitral valve is normal in structure. Mild mitral valve regurgitation.  6. The aortic valve is tricuspid. Aortic valve regurgitation is not visualized. No aortic stenosis is present.  7. Agitated saline contrast bubble study was negative, with no evidence of any interatrial shunt. Conclusion(s)/Recommendation(s): No LA/LAA thrombus identified. Negative bubble study for interatrial shunt. No intracardiac source of embolism detected on this on this transesophageal echocardiogram. Severely reduced LVEF. FINDINGS  Left Ventricle: Left ventricular ejection fraction, by estimation, is 20 to 25%. The left ventricle has severely decreased function. The left ventricle demonstrates global hypokinesis. The left ventricular internal cavity size was severely dilated. Right Ventricle: The right ventricular size is normal. No increase in right ventricular wall thickness. Right ventricular systolic function is normal. Left Atrium: Left atrial size was moderately dilated. No left atrial/left atrial appendage thrombus was detected. Right Atrium: Right atrial size was mildly dilated. Pericardium: There is no evidence of pericardial effusion. Mitral Valve: The mitral valve is normal in structure. Mild mitral valve regurgitation. There is no evidence of mitral valve vegetation. Tricuspid Valve: The tricuspid valve is normal in structure. Tricuspid valve regurgitation is trivial. No evidence of tricuspid stenosis. There is no evidence of tricuspid valve vegetation. Aortic Valve: The aortic valve is tricuspid. Aortic valve regurgitation is not visualized. No aortic stenosis is present. Aortic valve peak gradient measures 2.5 mmHg. There is no evidence of aortic valve vegetation. Pulmonic Valve: The pulmonic valve was grossly  normal. Pulmonic valve regurgitation is trivial. There is no evidence of pulmonic valve vegetation. Aorta: The aortic root and ascending aorta are structurally normal, with no evidence of dilitation. IAS/Shunts: The interatrial septum appears to be lipomatous. No atrial level shunt detected by color flow Doppler. Agitated saline contrast was given intravenously to evaluate for intracardiac shunting. Agitated saline contrast bubble study was negative,  with no evidence of any interatrial shunt.  AORTIC VALVE AV Vmax:      78.65 cm/s AV Peak Grad: 2.5 mmHg Jodelle Red MD Electronically signed by Jodelle Red MD Signature Date/Time: 01/25/2021/2:11:52 PM    Final         Scheduled Meds: . aspirin EC  81 mg Oral Daily  . atorvastatin  20 mg Oral Daily  . clopidogrel  75 mg Oral Daily  . folic acid  1 mg Oral Daily  . multivitamin with minerals  1 tablet Oral Daily  . sacubitril-valsartan  1 tablet Oral BID  . sodium chloride flush  3 mL Intravenous Q12H  . [START ON 01/26/2021] thiamine injection  100 mg Intravenous Daily   Continuous Infusions: . sodium chloride Stopped (01/24/21 0545)  . thiamine injection 500 mg (01/25/21 0604)     LOS: 2 days        Kathlen Mody, MD [facility]   To contact the attending provider between 7A-7P or the covering provider during after hours 7P-7A, please log into the web site [email or web address] and access using universal [place] password for that web site. If you do not have the password, please call the hospital operator.  01/25/2021, 2:18 PM

## 2021-01-25 NOTE — Transfer of Care (Signed)
Immediate Anesthesia Transfer of Care Note  Patient: Todd Vega  Procedure(s) Performed: TRANSESOPHAGEAL ECHOCARDIOGRAM (TEE) (N/A ) BUBBLE STUDY  Patient Location: Endoscopy Unit  Anesthesia Type:MAC  Level of Consciousness: drowsy  Airway & Oxygen Therapy: Patient Spontanous Breathing and Patient connected to nasal cannula oxygen  Post-op Assessment: Report given to RN and Post -op Vital signs reviewed and stable  Post vital signs: Reviewed and stable  Last Vitals:  Vitals Value Taken Time  BP 116/77 01/25/21 1234  Temp    Pulse 98 01/25/21 1235  Resp 28 01/25/21 1235  SpO2 95 % 01/25/21 1235  Vitals shown include unvalidated device data.  Last Pain:  Vitals:   01/25/21 1120  TempSrc: Temporal  PainSc: 0-No pain      Patients Stated Pain Goal: 0 (20/35/59 7416)  Complications: No complications documented.

## 2021-01-25 NOTE — Interval H&P Note (Signed)
History and Physical Interval Note:  01/25/2021 11:40 AM  Todd Vega  has presented today for surgery, with the diagnosis of stroke.  The various methods of treatment have been discussed with the patient and family. After consideration of risks, benefits and other options for treatment, the patient has consented to  Procedure(s): TRANSESOPHAGEAL ECHOCARDIOGRAM (TEE) (N/A) as a surgical intervention.  The patient's history has been reviewed, patient examined, no change in status, stable for surgery.  I have reviewed the patient's chart and labs.  Questions were answered to the patient's satisfaction.     [name] Cristal Deer

## 2021-01-25 NOTE — TOC Benefit Eligibility Note (Signed)
Patient Advocate Encounter  Insurance verification completed.    The patient is uninsured  Tarsha Blando, CPhT Pharmacy Patient Advocate Specialist [facility] Stewardship Team Direct Number: [phone]  Fax: [phone]        

## 2021-01-25 NOTE — Progress Notes (Signed)
  Echocardiogram TEE  has been performed.  Todd Vega 01/25/2021, 12:38 PM

## 2021-01-25 NOTE — Anesthesia Preprocedure Evaluation (Signed)
Anesthesia Evaluation  Patient identified by MRN, date of birth, ID band Patient awake    Reviewed: Allergy & Precautions, NPO status , Patient's Chart, lab work & pertinent test results  History of Anesthesia Complications Negative for: history of anesthetic complications  Airway Mallampati: II  TM Distance: >3 FB Neck ROM: Full    Dental   Pulmonary asthma ,    Pulmonary exam normal        Cardiovascular hypertension, +CHF  Normal cardiovascular exam     Neuro/Psych CVA    GI/Hepatic negative GI ROS, Neg liver ROS,   Endo/Other  negative endocrine ROS  Renal/GU negative Renal ROS  negative genitourinary   Musculoskeletal negative musculoskeletal ROS (+)   Abdominal   Peds  Hematology negative hematology ROS (+)   Anesthesia Other Findings   Reproductive/Obstetrics                             Anesthesia Physical Anesthesia Plan  ASA: IV  Anesthesia Plan: MAC   Post-op Pain Management:    Induction: Intravenous  PONV Risk Score and Plan: 1 and Propofol infusion, TIVA and Treatment may vary due to age or medical condition  Airway Management Planned: Natural Airway, Nasal Cannula and Simple Face Mask  Additional Equipment: None  Intra-op Plan:   Post-operative Plan:   Informed Consent: I have reviewed the patients History and Physical, chart, labs and discussed the procedure including the risks, benefits and alternatives for the proposed anesthesia with the patient or authorized representative who has indicated his/her understanding and acceptance.       Plan Discussed with:   Anesthesia Plan Comments:         Anesthesia Quick Evaluation

## 2021-01-25 NOTE — H&P (View-Only) (Signed)
Progress Note  Patient Name: Todd Vega Date of Encounter: 01/25/2021  [facility] Cardiologist: No primary care provider on file.   Subjective   Sitting up in the bed. No complaints. Communication with a white board but aphasia is improving.   Inpatient Medications    Scheduled Meds: . aspirin EC  81 mg Oral Daily  . atorvastatin  20 mg Oral Daily  . clopidogrel  75 mg Oral Daily  . folic acid  1 mg Oral Daily  . multivitamin with minerals  1 tablet Oral Daily  . sodium chloride flush  3 mL Intravenous Q12H  . [START ON 01/26/2021] thiamine injection  100 mg Intravenous Daily   Continuous Infusions: . sodium chloride Stopped (01/24/21 0545)  . thiamine injection 500 mg (01/25/21 0604)   PRN Meds: sodium chloride, acetaminophen **OR** acetaminophen (TYLENOL) oral liquid 160 mg/5 mL **OR** acetaminophen, LORazepam **OR** LORazepam, sodium chloride flush   Vital Signs    Vitals:   01/24/21 1943 01/24/21 2325 01/25/21 0605 01/25/21 0731  BP: (!) 149/110 (!) 164/108 (!) 151/106 (!) 146/102  Pulse: 98 96 89 88  Resp: 20 20  19   Temp: 98.1 F (36.7 C) 98.7 F (37.1 C) 98.2 F (36.8 C) 98.2 F (36.8 C)  TempSrc: Oral Oral Oral Oral  SpO2: 100% 100% 100% 100%  Weight:        Intake/Output Summary (Last 24 hours) at 01/25/2021 1013 Last data filed at 01/24/2021 1300 Gross per 24 hour  Intake 240 ml  Output --  Net 240 ml   Last 3 Weights 01/23/2021  Weight (lbs) 133 lb 2.5 oz  Weight (kg) 60.4 kg      Telemetry    SR - Personally Reviewed  ECG    No new tracing   Physical Exam   GEN: No acute distress.   Neck: No JVD Cardiac: RRR, no murmurs, rubs, or gallops.  Respiratory: Clear to auscultation bilaterally. GI: Soft, nontender, non-distended  MS: No edema; No deformity. Neuro:  Nonfocal, expressive aphasia  Psych: Normal affect   Labs    High Sensitivity Troponin:  No results for input(s): TROPONINIHS in the last 720 hours.     Chemistry Recent Labs  Lab 01/23/21 0531 01/23/21 0535 01/24/21 1105  NA 133* 135 133*  K 4.1 4.1 3.9  CL 100 103 100  CO2 20*  --  23  GLUCOSE 116* 112* 112*  BUN 14 16 14   CREATININE 1.09 1.00 1.20  CALCIUM 8.8*  --  9.1  PROT 7.7  --   --   ALBUMIN 3.6  --   --   AST 34  --   --   ALT 18  --   --   ALKPHOS 71  --   --   BILITOT 0.3  --   --   GFRNONAA >60  --  >60  ANIONGAP 13  --  10     Hematology Recent Labs  Lab 01/23/21 0531 01/23/21 0535  WBC 3.3*  --   RBC 5.61  --   HGB 14.5 17.0  HCT 44.3 50.0  MCV 79.0*  --   MCH 25.8*  --   MCHC 32.7  --   RDW 14.8  --   PLT 154  --     BNPNo results for input(s): BNP, PROBNP in the last 168 hours.   DDimer No results for input(s): DDIMER in the last 168 hours.   Radiology    ECHOCARDIOGRAM COMPLETE  Result Date:  01/23/2021    ECHOCARDIOGRAM REPORT   Patient Name:   Todd Vega Date of Exam: 01/23/2021 Medical Rec #:  [id number]     Height: Accession #:    [id number]    Weight:       133.2 lb Date of Birth:  06/04/1961      BSA:          1.540 m Patient Age:    60 years      BP:           157/94 mmHg Patient Gender: M             HR:           94 bpm. Exam Location:  Inpatient Procedure: 2D Echo Indications:    stroke  History:        Patient has no prior history of Echocardiogram examinations.  Sonographer:    Delcie Roch Referring Phys: [phone] RACHAL A Jakson IMPRESSIONS  1. Left ventricular ejection fraction, by estimation, is 25 to 30%. The left ventricle has severely decreased function. The left ventricle demonstrates global hypokinesis. The left ventricular internal cavity size was severely dilated. There is mild asymmetric left ventricular hypertrophy of the basal and septal segments. Left ventricular diastolic parameters are consistent with Grade I diastolic dysfunction (impaired relaxation).  2. Right ventricular systolic function is normal. The right ventricular size is normal.  3. Left atrial size was  moderately dilated.  4. The mitral valve is normal in structure. Mild mitral valve regurgitation. No evidence of mitral stenosis.  5. The aortic valve is tricuspid. There is mild calcification of the aortic valve. Aortic valve regurgitation is not visualized. Mild aortic valve sclerosis is present, with no evidence of aortic valve stenosis.  6. Aortic dilatation noted. There is mild dilatation of the aortic root, measuring 40 mm.  7. The inferior vena cava is normal in size with greater than 50% respiratory variability, suggesting right atrial pressure of 3 mmHg. FINDINGS  Left Ventricle: Left ventricular ejection fraction, by estimation, is 25 to 30%. The left ventricle has severely decreased function. The left ventricle demonstrates global hypokinesis. The left ventricular internal cavity size was severely dilated. There is mild asymmetric left ventricular hypertrophy of the basal and septal segments. Left ventricular diastolic parameters are consistent with Grade I diastolic dysfunction (impaired relaxation). Right Ventricle: The right ventricular size is normal. No increase in right ventricular wall thickness. Right ventricular systolic function is normal. Left Atrium: Left atrial size was moderately dilated. Right Atrium: Right atrial size was not well visualized. Pericardium: There is no evidence of pericardial effusion. Mitral Valve: The mitral valve is normal in structure. Mild mitral valve regurgitation. No evidence of mitral valve stenosis. Tricuspid Valve: The tricuspid valve is normal in structure. Tricuspid valve regurgitation is trivial. No evidence of tricuspid stenosis. Aortic Valve: The aortic valve is tricuspid. There is mild calcification of the aortic valve. Aortic valve regurgitation is not visualized. Mild aortic valve sclerosis is present, with no evidence of aortic valve stenosis. Pulmonic Valve: The pulmonic valve was normal in structure. Pulmonic valve regurgitation is not visualized. No  evidence of pulmonic stenosis. Aorta: The aortic root is normal in size and structure and aortic dilatation noted. There is mild dilatation of the aortic root, measuring 40 mm. Venous: The inferior vena cava is normal in size with greater than 50% respiratory variability, suggesting right atrial pressure of 3 mmHg. IAS/Shunts: No atrial level shunt detected by color flow Doppler.  LEFT VENTRICLE PLAX  2D LVIDd:         5.90 cm      Diastology LVIDs:         5.50 cm      LV e' medial:    3.59 cm/s LV PW:         1.30 cm      LV E/e' medial:  19.2 LV IVS:        1.30 cm      LV e' lateral:   5.66 cm/s LVOT diam:     2.10 cm      LV E/e' lateral: 12.2 LV SV:         33 LV SV Index:   21 LVOT Area:     3.46 cm  LV Volumes (MOD) LV vol d, MOD A4C: 191.0 ml LV vol s, MOD A4C: 150.0 ml LV SV MOD A4C:     191.0 ml RIGHT VENTRICLE             IVC RV S prime:     14.30 cm/s  IVC diam: 1.30 cm TAPSE (M-mode): 2.1 cm LEFT ATRIUM             Index       RIGHT ATRIUM           Index LA diam:        4.50 cm 2.92 cm/m  RA Area:     13.20 cm LA Vol (A2C):   72.7 ml 47.22 ml/m RA Volume:   28.60 ml  18.58 ml/m LA Vol (A4C):   48.2 ml 31.31 ml/m LA Biplane Vol: 63.7 ml 41.37 ml/m  AORTIC VALVE LVOT Vmax:   65.60 cm/s LVOT Vmean:  40.300 cm/s LVOT VTI:    0.096 m  AORTA Ao Root diam: 4.00 cm Ao Asc diam:  3.10 cm MV E velocity: 69.00 cm/s MV A velocity: 96.80 cm/s  SHUNTS MV E/A ratio:  0.71        Systemic VTI:  0.10 m                            Systemic Diam: 2.10 cm Charlton HawsPeter Nishan MD Electronically signed by Charlton HawsPeter Nishan MD Signature Date/Time: 01/23/2021/11:39:58 AM    Final     Cardiac Studies   Echo: 01/23/21  IMPRESSIONS    1. Left ventricular ejection fraction, by estimation, is 25 to 30%. The  left ventricle has severely decreased function. The left ventricle  demonstrates global hypokinesis. The left ventricular internal cavity size  was severely dilated. There is mild  asymmetric left ventricular hypertrophy  of the basal and septal segments.  Left ventricular diastolic parameters are consistent with Grade I  diastolic dysfunction (impaired relaxation).  2. Right ventricular systolic function is normal. The right ventricular  size is normal.  3. Left atrial size was moderately dilated.  4. The mitral valve is normal in structure. Mild mitral valve  regurgitation. No evidence of mitral stenosis.  5. The aortic valve is tricuspid. There is mild calcification of the  aortic valve. Aortic valve regurgitation is not visualized. Mild aortic  valve sclerosis is present, with no evidence of aortic valve stenosis.  6. Aortic dilatation noted. There is mild dilatation of the aortic root,  measuring 40 mm.  7. The inferior vena cava is normal in size with greater than 50%  respiratory variability, suggesting right atrial pressure of 3 mmHg.   Patient Profile     60  y.o. male with no significant PMH who was seen for the evaluation of new cardiomyopathy at the request of Dr. Blake Divine.  Assessment & Plan    1. New cardiomyopathy: Pt presented with acute CVA with aphasia and right sided facial droop with MRI imaging showing left frontal MCA branch infarct>>CT angiogram showed no large vessel stenosis or occlusion.  -- Unfortunately echocardiogram shows significantly reduced LVEF at 25 to 30% with global hypokinesis, LVH and G1DD.  -- per neurology ok to add in BP medications today. Will start Entresto 24/26 today, add in spiro tomorrow -- Will need further ischemic workup with R/LHC to ensure no coronary etiology for significant LV dysfunction once more stable from a neuro standpoint.   2. Left MCA CVA: Pt had acute onset of aphasia with right facial droop>>cod stoke initiated>>not a candidate for TPA. MRI on [facility] arrival with left MCA stroke. CTA with no significant atherosclerosis. HbA1c stable at 5.6 with LDL at 85.  -- CVA felt to be cardio embolic given severely reduced LVEF on echo  -- Plan for TEE  with possible loop implantation versus 30 day cardiac monitor to evaluate for atrial fibrillation. I have reviewed TEE with patient regards risk and benefit. He is willing to proceed.  -- Plan per neuro is for DAPT with ASA and Plavix for 3 weeks, then ASA alone thereafter   3. HTN: BP markedly elevated on ED arrival with SBPs in the 170-180 range -- per neurology ok to start BP meds. Will start Entresto 24/26 today  4. HLD: LDL, 85 with LDL goal <70 given CVA -- Plan for statin therapy    For questions or updates, please contact [facility] Please consult [email or web address] for contact info under        Signed, Laverda Page, NP  01/25/2021, 10:13 AM    I have personally seen and examined this patient. I agree with the assessment and plan as outlined above.  Admitted with a stroke. New finding of cardiomyopathy. TEE today. He will need a right and left heart cath before discharge. Possibly tomorrow or Wednesday.  If no findings on TEE to suggest intracardiac thrombus, will need EP input to decide on loop recorder vs 30 day monitor.  Verne Carrow 01/25/2021 10:33 AM

## 2021-01-25 NOTE — Progress Notes (Addendum)
Progress Note  Patient Name: Todd Vega Date of Encounter: 01/25/2021  [facility] Cardiologist: No primary care provider on file.   Subjective   Sitting up in the bed. No complaints. Communication with a white board but aphasia is improving.   Inpatient Medications    Scheduled Meds: . aspirin EC  81 mg Oral Daily  . atorvastatin  20 mg Oral Daily  . clopidogrel  75 mg Oral Daily  . folic acid  1 mg Oral Daily  . multivitamin with minerals  1 tablet Oral Daily  . sodium chloride flush  3 mL Intravenous Q12H  . [START ON 01/26/2021] thiamine injection  100 mg Intravenous Daily   Continuous Infusions: . sodium chloride Stopped (01/24/21 0545)  . thiamine injection 500 mg (01/25/21 0604)   PRN Meds: sodium chloride, acetaminophen **OR** acetaminophen (TYLENOL) oral liquid 160 mg/5 mL **OR** acetaminophen, LORazepam **OR** LORazepam, sodium chloride flush   Vital Signs    Vitals:   01/24/21 1943 01/24/21 2325 01/25/21 0605 01/25/21 0731  BP: (!) 149/110 (!) 164/108 (!) 151/106 (!) 146/102  Pulse: 98 96 89 88  Resp: 20 20  19   Temp: 98.1 F (36.7 C) 98.7 F (37.1 C) 98.2 F (36.8 C) 98.2 F (36.8 C)  TempSrc: Oral Oral Oral Oral  SpO2: 100% 100% 100% 100%  Weight:        Intake/Output Summary (Last 24 hours) at 01/25/2021 1013 Last data filed at 01/24/2021 1300 Gross per 24 hour  Intake 240 ml  Output --  Net 240 ml   Last 3 Weights 01/23/2021  Weight (lbs) 133 lb 2.5 oz  Weight (kg) 60.4 kg      Telemetry    SR - Personally Reviewed  ECG    No new tracing   Physical Exam   GEN: No acute distress.   Neck: No JVD Cardiac: RRR, no murmurs, rubs, or gallops.  Respiratory: Clear to auscultation bilaterally. GI: Soft, nontender, non-distended  MS: No edema; No deformity. Neuro:  Nonfocal, expressive aphasia  Psych: Normal affect   Labs    High Sensitivity Troponin:  No results for input(s): TROPONINIHS in the last 720 hours.     Chemistry Recent Labs  Lab 01/23/21 0531 01/23/21 0535 01/24/21 1105  NA 133* 135 133*  K 4.1 4.1 3.9  CL 100 103 100  CO2 20*  --  23  GLUCOSE 116* 112* 112*  BUN 14 16 14   CREATININE 1.09 1.00 1.20  CALCIUM 8.8*  --  9.1  PROT 7.7  --   --   ALBUMIN 3.6  --   --   AST 34  --   --   ALT 18  --   --   ALKPHOS 71  --   --   BILITOT 0.3  --   --   GFRNONAA >60  --  >60  ANIONGAP 13  --  10     Hematology Recent Labs  Lab 01/23/21 0531 01/23/21 0535  WBC 3.3*  --   RBC 5.61  --   HGB 14.5 17.0  HCT 44.3 50.0  MCV 79.0*  --   MCH 25.8*  --   MCHC 32.7  --   RDW 14.8  --   PLT 154  --     BNPNo results for input(s): BNP, PROBNP in the last 168 hours.   DDimer No results for input(s): DDIMER in the last 168 hours.   Radiology    ECHOCARDIOGRAM COMPLETE  Result Date:  01/23/2021    ECHOCARDIOGRAM REPORT   Patient Name:   Todd Vega Date of Exam: 01/23/2021 Medical Rec #:  [id number]     Height: Accession #:    [id number]    Weight:       133.2 lb Date of Birth:  06/04/1961      BSA:          1.540 m Patient Age:    60 years      BP:           157/94 mmHg Patient Gender: M             HR:           94 bpm. Exam Location:  Inpatient Procedure: 2D Echo Indications:    stroke  History:        Patient has no prior history of Echocardiogram examinations.  Sonographer:    Delcie Roch Referring Phys: [phone] RACHAL A Jakson IMPRESSIONS  1. Left ventricular ejection fraction, by estimation, is 25 to 30%. The left ventricle has severely decreased function. The left ventricle demonstrates global hypokinesis. The left ventricular internal cavity size was severely dilated. There is mild asymmetric left ventricular hypertrophy of the basal and septal segments. Left ventricular diastolic parameters are consistent with Grade I diastolic dysfunction (impaired relaxation).  2. Right ventricular systolic function is normal. The right ventricular size is normal.  3. Left atrial size was  moderately dilated.  4. The mitral valve is normal in structure. Mild mitral valve regurgitation. No evidence of mitral stenosis.  5. The aortic valve is tricuspid. There is mild calcification of the aortic valve. Aortic valve regurgitation is not visualized. Mild aortic valve sclerosis is present, with no evidence of aortic valve stenosis.  6. Aortic dilatation noted. There is mild dilatation of the aortic root, measuring 40 mm.  7. The inferior vena cava is normal in size with greater than 50% respiratory variability, suggesting right atrial pressure of 3 mmHg. FINDINGS  Left Ventricle: Left ventricular ejection fraction, by estimation, is 25 to 30%. The left ventricle has severely decreased function. The left ventricle demonstrates global hypokinesis. The left ventricular internal cavity size was severely dilated. There is mild asymmetric left ventricular hypertrophy of the basal and septal segments. Left ventricular diastolic parameters are consistent with Grade I diastolic dysfunction (impaired relaxation). Right Ventricle: The right ventricular size is normal. No increase in right ventricular wall thickness. Right ventricular systolic function is normal. Left Atrium: Left atrial size was moderately dilated. Right Atrium: Right atrial size was not well visualized. Pericardium: There is no evidence of pericardial effusion. Mitral Valve: The mitral valve is normal in structure. Mild mitral valve regurgitation. No evidence of mitral valve stenosis. Tricuspid Valve: The tricuspid valve is normal in structure. Tricuspid valve regurgitation is trivial. No evidence of tricuspid stenosis. Aortic Valve: The aortic valve is tricuspid. There is mild calcification of the aortic valve. Aortic valve regurgitation is not visualized. Mild aortic valve sclerosis is present, with no evidence of aortic valve stenosis. Pulmonic Valve: The pulmonic valve was normal in structure. Pulmonic valve regurgitation is not visualized. No  evidence of pulmonic stenosis. Aorta: The aortic root is normal in size and structure and aortic dilatation noted. There is mild dilatation of the aortic root, measuring 40 mm. Venous: The inferior vena cava is normal in size with greater than 50% respiratory variability, suggesting right atrial pressure of 3 mmHg. IAS/Shunts: No atrial level shunt detected by color flow Doppler.  LEFT VENTRICLE PLAX  2D LVIDd:         5.90 cm      Diastology LVIDs:         5.50 cm      LV e' medial:    3.59 cm/s LV PW:         1.30 cm      LV E/e' medial:  19.2 LV IVS:        1.30 cm      LV e' lateral:   5.66 cm/s LVOT diam:     2.10 cm      LV E/e' lateral: 12.2 LV SV:         33 LV SV Index:   21 LVOT Area:     3.46 cm  LV Volumes (MOD) LV vol d, MOD A4C: 191.0 ml LV vol s, MOD A4C: 150.0 ml LV SV MOD A4C:     191.0 ml RIGHT VENTRICLE             IVC RV S prime:     14.30 cm/s  IVC diam: 1.30 cm TAPSE (M-mode): 2.1 cm LEFT ATRIUM             Index       RIGHT ATRIUM           Index LA diam:        4.50 cm 2.92 cm/m  RA Area:     13.20 cm LA Vol (A2C):   72.7 ml 47.22 ml/m RA Volume:   28.60 ml  18.58 ml/m LA Vol (A4C):   48.2 ml 31.31 ml/m LA Biplane Vol: 63.7 ml 41.37 ml/m  AORTIC VALVE LVOT Vmax:   65.60 cm/s LVOT Vmean:  40.300 cm/s LVOT VTI:    0.096 m  AORTA Ao Root diam: 4.00 cm Ao Asc diam:  3.10 cm MV E velocity: 69.00 cm/s MV A velocity: 96.80 cm/s  SHUNTS MV E/A ratio:  0.71        Systemic VTI:  0.10 m                            Systemic Diam: 2.10 cm Peter Nishan MD Electronically signed by Peter Nishan MD Signature Date/Time: 01/23/2021/11:39:58 AM    Final     Cardiac Studies   Echo: 01/23/21  IMPRESSIONS    1. Left ventricular ejection fraction, by estimation, is 25 to 30%. The  left ventricle has severely decreased function. The left ventricle  demonstrates global hypokinesis. The left ventricular internal cavity size  was severely dilated. There is mild  asymmetric left ventricular hypertrophy  of the basal and septal segments.  Left ventricular diastolic parameters are consistent with Grade I  diastolic dysfunction (impaired relaxation).  2. Right ventricular systolic function is normal. The right ventricular  size is normal.  3. Left atrial size was moderately dilated.  4. The mitral valve is normal in structure. Mild mitral valve  regurgitation. No evidence of mitral stenosis.  5. The aortic valve is tricuspid. There is mild calcification of the  aortic valve. Aortic valve regurgitation is not visualized. Mild aortic  valve sclerosis is present, with no evidence of aortic valve stenosis.  6. Aortic dilatation noted. There is mild dilatation of the aortic root,  measuring 40 mm.  7. The inferior vena cava is normal in size with greater than 50%  respiratory variability, suggesting right atrial pressure of 3 mmHg.   Patient Profile     60   y.o. male with no significant PMH who was seen for the evaluation of new cardiomyopathy at the request of Dr. Blake Divine.  Assessment & Plan    1. New cardiomyopathy: Pt presented with acute CVA with aphasia and right sided facial droop with MRI imaging showing left frontal MCA branch infarct>>CT angiogram showed no large vessel stenosis or occlusion.  -- Unfortunately echocardiogram shows significantly reduced LVEF at 25 to 30% with global hypokinesis, LVH and G1DD.  -- per neurology ok to add in BP medications today. Will start Entresto 24/26 today, add in spiro tomorrow -- Will need further ischemic workup with R/LHC to ensure no coronary etiology for significant LV dysfunction once more stable from a neuro standpoint.   2. Left MCA CVA: Pt had acute onset of aphasia with right facial droop>>cod stoke initiated>>not a candidate for TPA. MRI on [facility] arrival with left MCA stroke. CTA with no significant atherosclerosis. HbA1c stable at 5.6 with LDL at 85.  -- CVA felt to be cardio embolic given severely reduced LVEF on echo  -- Plan for TEE  with possible loop implantation versus 30 day cardiac monitor to evaluate for atrial fibrillation. I have reviewed TEE with patient regards risk and benefit. He is willing to proceed.  -- Plan per neuro is for DAPT with ASA and Plavix for 3 weeks, then ASA alone thereafter   3. HTN: BP markedly elevated on ED arrival with SBPs in the 170-180 range -- per neurology ok to start BP meds. Will start Entresto 24/26 today  4. HLD: LDL, 85 with LDL goal <70 given CVA -- Plan for statin therapy    For questions or updates, please contact [facility] Please consult [email or web address] for contact info under        Signed, Laverda Page, NP  01/25/2021, 10:13 AM    I have personally seen and examined this patient. I agree with the assessment and plan as outlined above.  Admitted with a stroke. New finding of cardiomyopathy. TEE today. He will need a right and left heart cath before discharge. Possibly tomorrow or Wednesday.  If no findings on TEE to suggest intracardiac thrombus, will need EP input to decide on loop recorder vs 30 day monitor.  Verne Carrow 01/25/2021 10:33 AM

## 2021-01-25 NOTE — CV Procedure (Signed)
    TRANSESOPHAGEAL ECHOCARDIOGRAM   NAME:  Todd Vega   MRN: [id number] DOB:  1961-06-14   ADMIT DATE: 01/23/2021  INDICATIONS: Stroke  PROCEDURE:   Informed consent was obtained prior to the procedure. The risks, benefits and alternatives for the procedure were discussed and the patient comprehended these risks.  Risks include, but are not limited to, cough, sore throat, vomiting, nausea, somnolence, esophageal and stomach trauma or perforation, bleeding, low blood pressure, aspiration, pneumonia, infection, trauma to the teeth and death.    Procedural time out performed.   Patient received monitored anesthesia care under the supervision of Dr. Hart Rochester. Patient received a total of 209 mg propofol during the procedure.  The transesophageal probe was inserted in the esophagus and stomach without difficulty and multiple views were obtained.    COMPLICATIONS:    There were no immediate complications.  FINDINGS:  LEFT VENTRICLE: EF = 20-25%. Global hypokinesis.  RIGHT VENTRICLE: Normal size and function.   LEFT ATRIUM: No thrombus/mass.  LEFT ATRIAL APPENDAGE: No thrombus/mass.   RIGHT ATRIUM: No thrombus/mass.  AORTIC VALVE:  Trileaflet. No regurgitation. No vegetation.  MITRAL VALVE:    Normal structure. Mild regurgitation. No vegetation.  TRICUSPID VALVE: Normal structure. Trivial regurgitation. No vegetation.  PULMONIC VALVE: Grossly normal structure. Trivial regurgitation. No apparent vegetation.  INTERATRIAL SEPTUM: No PFO or ASD seen by color Doppler. Agitated saline contrast (bubble) study is negative for right to left shunt  PERICARDIUM: No effusion noted.  DESCENDING AORTA: Mild diffuse plaque seen   CONCLUSION: Low EF. No cardiac source of embolism identified.   Jodelle Red, MD, PhD [facility]  [address] [place], [place] [address] [phone]   12:30 PM

## 2021-01-26 ENCOUNTER — Inpatient Hospital Stay (HOSPITAL_COMMUNITY): Payer: Self-pay

## 2021-01-26 ENCOUNTER — Encounter (HOSPITAL_COMMUNITY): Payer: Self-pay | Admitting: Family Medicine

## 2021-01-26 DIAGNOSIS — I5040 Unspecified combined systolic (congestive) and diastolic (congestive) heart failure: Secondary | ICD-10-CM | POA: Diagnosis present

## 2021-01-26 DIAGNOSIS — F1010 Alcohol abuse, uncomplicated: Secondary | ICD-10-CM | POA: Diagnosis present

## 2021-01-26 DIAGNOSIS — E785 Hyperlipidemia, unspecified: Secondary | ICD-10-CM | POA: Diagnosis present

## 2021-01-26 DIAGNOSIS — E871 Hypo-osmolality and hyponatremia: Secondary | ICD-10-CM | POA: Diagnosis present

## 2021-01-26 LAB — BASIC METABOLIC PANEL
Anion gap: 12 (ref 5–15)
BUN: 20 mg/dL (ref 6–20)
CO2: 21 mmol/L — ABNORMAL LOW (ref 22–32)
Calcium: 8.8 mg/dL — ABNORMAL LOW (ref 8.9–10.3)
Chloride: 100 mmol/L (ref 98–111)
Creatinine, Ser: 1.28 mg/dL — ABNORMAL HIGH (ref 0.61–1.24)
GFR, Estimated: >60 mL/min (ref >60)
Glucose, Bld: 103 mg/dL — ABNORMAL HIGH (ref 70–99)
Potassium: 4 mmol/L (ref 3.5–5.1)
Sodium: 133 mmol/L — ABNORMAL LOW (ref 135–145)

## 2021-01-26 LAB — CBC
HCT: 44.2 % (ref 39.0–52.0)
Hemoglobin: 14.5 g/dL (ref 13.0–17.0)
MCH: 26 pg (ref 26.0–34.0)
MCHC: 32.8 g/dL (ref 30.0–36.0)
MCV: 79.2 fL — ABNORMAL LOW (ref 80.0–100.0)
Platelets: 165 10*3/uL (ref 150–400)
RBC: 5.58 MIL/uL (ref 4.22–5.81)
RDW: 14.1 % (ref 11.5–15.5)
WBC: 3.6 10*3/uL — ABNORMAL LOW (ref 4.0–10.5)
nRBC: 0 % (ref 0.0–0.2)

## 2021-01-26 LAB — MAGNESIUM: Magnesium: 2 mg/dL (ref 1.7–2.4)

## 2021-01-26 MED ORDER — ASPIRIN EC 81 MG PO TBEC
81.0000 mg | DELAYED_RELEASE_TABLET | Freq: Every day | ORAL | Status: DC
  Administered 2021-01-28: 81 mg via ORAL
  Filled 2021-01-26: qty 1

## 2021-01-26 MED ORDER — ASPIRIN 81 MG PO CHEW
81.0000 mg | CHEWABLE_TABLET | ORAL | Status: AC
Start: 2021-01-27 — End: 2021-01-27
  Administered 2021-01-27: 81 mg via ORAL
  Filled 2021-01-26: qty 1

## 2021-01-26 MED ORDER — PERFLUTREN LIPID MICROSPHERE
1.0000 mL | INTRAVENOUS | Status: DC | PRN
  Administered 2021-01-26: 2 mL via INTRAVENOUS
  Filled 2021-01-26: qty 10

## 2021-01-26 MED ORDER — PERFLUTREN LIPID MICROSPHERE
INTRAVENOUS | Status: AC
  Filled 2021-01-26: qty 10

## 2021-01-26 MED ORDER — SODIUM CHLORIDE 0.9 % WEIGHT BASED INFUSION
3.0000 mL/kg/h | INTRAVENOUS | Status: DC
  Administered 2021-01-27: 3 mL/kg/h via INTRAVENOUS

## 2021-01-26 MED ORDER — SODIUM CHLORIDE 0.9% FLUSH: 3.0000 mL | INTRAVENOUS | Status: DC | PRN

## 2021-01-26 MED ORDER — SODIUM CHLORIDE 0.9 % WEIGHT BASED INFUSION
1.0000 mL/kg/h | INTRAVENOUS | Status: DC
  Administered 2021-01-27: 1 mL/kg/h via INTRAVENOUS

## 2021-01-26 MED ORDER — SODIUM CHLORIDE 0.9 % IV SOLN: 250.0000 mL | INTRAVENOUS | Status: DC | PRN

## 2021-01-26 MED ORDER — THIAMINE HCL 100 MG PO TABS
100.0000 mg | ORAL_TABLET | Freq: Every day | ORAL | Status: DC
  Administered 2021-01-26 – 2021-01-28 (×3): 100 mg via ORAL
  Filled 2021-01-26 (×3): qty 1

## 2021-01-26 MED ORDER — SODIUM CHLORIDE 0.9% FLUSH
3.0000 mL | Freq: Two times a day (BID) | INTRAVENOUS | Status: DC
  Administered 2021-01-26: 3 mL via INTRAVENOUS

## 2021-01-26 MED ORDER — CARVEDILOL 6.25 MG PO TABS
6.2500 mg | ORAL_TABLET | Freq: Two times a day (BID) | ORAL | Status: DC
  Administered 2021-01-26 – 2021-01-28 (×6): 6.25 mg via ORAL
  Filled 2021-01-26 (×6): qty 1

## 2021-01-26 NOTE — Progress Notes (Signed)
Progress Note  Patient Name: Todd SauceDavid Vega Date of Encounter: 01/26/2021  [facility] Cardiologist: Kristeen MissPhilip Nahser, MD   Subjective   No chest pain or dyspnea. No events overnight.   Inpatient Medications    Scheduled Meds: . aspirin EC  81 mg Oral Daily  . atorvastatin  20 mg Oral Daily  . carvedilol  6.25 mg Oral BID WC  . clopidogrel  75 mg Oral Daily  . folic acid  1 mg Oral Daily  . multivitamin with minerals  1 tablet Oral Daily  . sacubitril-valsartan  1 tablet Oral BID  . sodium chloride flush  3 mL Intravenous Q12H  . thiamine  100 mg Oral Daily   Continuous Infusions: . sodium chloride 10 mL/hr at 01/24/21 1029   PRN Meds: sodium chloride, acetaminophen **OR** acetaminophen (TYLENOL) oral liquid 160 mg/5 mL **OR** acetaminophen, sodium chloride flush   Vital Signs    Vitals:   01/26/21 0127 01/26/21 0420 01/26/21 0730 01/26/21 0914  BP:  111/82 131/84 111/74  Pulse: (!) 104 77 80 88  Resp:  19 19   Temp:  97.7 F (36.5 C) 98.2 F (36.8 C)   TempSrc:  Oral Oral   SpO2:  100% 100%   Weight:        Intake/Output Summary (Last 24 hours) at 01/26/2021 1040 Last data filed at 01/26/2021 [id number] Gross per 24 hour  Intake 381.68 ml  Output --  Net 381.68 ml   Last 3 Weights 01/23/2021  Weight (lbs) 133 lb 2.5 oz  Weight (kg) 60.4 kg      Telemetry    Sinus- Personally Reviewed  ECG    No new tracing   Physical Exam   General: Well developed, well nourished, NAD  HEENT: OP clear, mucus membranes moist  SKIN: warm, dry. No rashes. Neuro: No focal deficits, expressive aphasia Musculoskeletal: Muscle strength 5/5 all ext  Psychiatric: Mood and affect normal  Neck: No JVD, no carotid bruits, no thyromegaly, no lymphadenopathy.  Lungs:Clear bilaterally, no wheezes, rhonci, crackles Cardiovascular: Regular rate and rhythm. No murmurs, gallops or rubs. Abdomen:Soft. Bowel sounds present. Non-tender.  Extremities: No lower extremity edema. Pulses  are 2 + in the bilateral DP/PT.  Labs    High Sensitivity Troponin:  No results for input(s): TROPONINIHS in the last 720 hours.    Chemistry Recent Labs  Lab 01/23/21 0531 01/23/21 0535 01/24/21 1105 01/26/21 0427  NA 133* 135 133* 133*  K 4.1 4.1 3.9 4.0  CL 100 103 100 100  CO2 20*  --  23 21*  GLUCOSE 116* 112* 112* 103*  BUN 14 16 14 20   CREATININE 1.09 1.00 1.20 1.28*  CALCIUM 8.8*  --  9.1 8.8*  PROT 7.7  --   --   --   ALBUMIN 3.6  --   --   --   AST 34  --   --   --   ALT 18  --   --   --   ALKPHOS 71  --   --   --   BILITOT 0.3  --   --   --   GFRNONAA >60  --  >60 >60  ANIONGAP 13  --  10 12     Hematology Recent Labs  Lab 01/23/21 0531 01/23/21 0535  WBC 3.3*  --   RBC 5.61  --   HGB 14.5 17.0  HCT 44.3 50.0  MCV 79.0*  --   MCH 25.8*  --  MCHC 32.7  --   RDW 14.8  --   PLT 154  --     BNPNo results for input(s): BNP, PROBNP in the last 168 hours.   DDimer No results for input(s): DDIMER in the last 168 hours.   Radiology    VAS [place] TRANSCRANIAL DOPPLER W BUBBLES  Result Date: 01/25/2021  Transcranial Doppler with Bubble Indications: Stroke. History: Small acute cortical infarct in the left frontal region. Comparison Study: No prior study on file Performing Technologist: Sherren Kerns RVS Supporting Technologist: Jean Rosenthal RDMS,RVT  Examination Guidelines: A complete evaluation includes B-mode imaging, spectral Doppler, color Doppler, and power Doppler as needed of all accessible portions of each vessel. Bilateral testing is considered an integral part of a complete examination. Limited examinations for reoccurring indications may be performed as noted.  Summary: No HITS at rest or during Valsalva. Negative transcranial Doppler Bubble study with no evidence of right to left intracardiac communication.  *See table(s) above for TCD measurements and observations.    Preliminary    ECHO TEE  Result Date: 01/25/2021    TRANSESOPHOGEAL ECHO REPORT    Patient Name:   Todd Vega Date of Exam: 01/25/2021 Medical Rec #:  [id number]     Height: Accession #:    [id number]    Weight:       133.2 lb Date of Birth:  1961-06-27      BSA:          1.540 m Patient Age:    60 years      BP:           116/77 mmHg Patient Gender: M             HR:           100 bpm. Exam Location:  Inpatient Procedure: Transesophageal Echo, Cardiac Doppler, Color Doppler and Saline            Contrast Bubble Study Indications:     CVA  History:         Patient has prior history of Echocardiogram examinations, most                  recent 01/23/2021. Cardiomyopathy, Stroke; Risk                  Factors:Hypertension and Dyslipidemia.  Sonographer:     Lavenia Atlas Referring Phys:  [id number] Kathleene Hazel Diagnosing Phys: Jodelle Red MD PROCEDURE: After discussion of the risks and benefits of a TEE, an informed consent was obtained from the patient. The transesophogeal probe was passed without difficulty through the esophogus of the patient. Sedation performed by different physician. The patient was monitored while under deep sedation. Anesthestetic sedation was provided intravenously by Anesthesiology: 209mg  of Propofol, 60mg  of Lidocaine. Image quality was good. The patient's vital signs; including heart rate, blood pressure, and oxygen saturation; remained stable throughout the procedure. The patient developed no complications during the procedure. IMPRESSIONS  1. Left ventricular ejection fraction, by estimation, is 20 to 25%. The left ventricle has severely decreased function. The left ventricle demonstrates global hypokinesis. The left ventricular internal cavity size was severely dilated.  2. Right ventricular systolic function is normal. The right ventricular size is normal.  3. Left atrial size was moderately dilated. No left atrial/left atrial appendage thrombus was detected.  4. Right atrial size was mildly dilated.  5. The mitral valve is normal in structure. Mild  mitral valve regurgitation.  6. The aortic valve is  tricuspid. Aortic valve regurgitation is not visualized. No aortic stenosis is present.  7. Agitated saline contrast bubble study was negative, with no evidence of any interatrial shunt. Conclusion(s)/Recommendation(s): No LA/LAA thrombus identified. Negative bubble study for interatrial shunt. No intracardiac source of embolism detected on this on this transesophageal echocardiogram. Severely reduced LVEF. FINDINGS  Left Ventricle: Left ventricular ejection fraction, by estimation, is 20 to 25%. The left ventricle has severely decreased function. The left ventricle demonstrates global hypokinesis. The left ventricular internal cavity size was severely dilated. Right Ventricle: The right ventricular size is normal. No increase in right ventricular wall thickness. Right ventricular systolic function is normal. Left Atrium: Left atrial size was moderately dilated. No left atrial/left atrial appendage thrombus was detected. Right Atrium: Right atrial size was mildly dilated. Pericardium: There is no evidence of pericardial effusion. Mitral Valve: The mitral valve is normal in structure. Mild mitral valve regurgitation. There is no evidence of mitral valve vegetation. Tricuspid Valve: The tricuspid valve is normal in structure. Tricuspid valve regurgitation is trivial. No evidence of tricuspid stenosis. There is no evidence of tricuspid valve vegetation. Aortic Valve: The aortic valve is tricuspid. Aortic valve regurgitation is not visualized. No aortic stenosis is present. Aortic valve peak gradient measures 2.5 mmHg. There is no evidence of aortic valve vegetation. Pulmonic Valve: The pulmonic valve was grossly normal. Pulmonic valve regurgitation is trivial. There is no evidence of pulmonic valve vegetation. Aorta: The aortic root and ascending aorta are structurally normal, with no evidence of dilitation. IAS/Shunts: The interatrial septum appears to be lipomatous.  No atrial level shunt detected by color flow Doppler. Agitated saline contrast was given intravenously to evaluate for intracardiac shunting. Agitated saline contrast bubble study was negative,  with no evidence of any interatrial shunt.  AORTIC VALVE AV Vmax:      78.65 cm/s AV Peak Grad: 2.5 mmHg Jodelle Red MD Electronically signed by Jodelle Red MD Signature Date/Time: 01/25/2021/2:11:52 PM    Final     Cardiac Studies   Echo: 01/23/21  IMPRESSIONS    1. Left ventricular ejection fraction, by estimation, is 25 to 30%. The  left ventricle has severely decreased function. The left ventricle  demonstrates global hypokinesis. The left ventricular internal cavity size  was severely dilated. There is mild  asymmetric left ventricular hypertrophy of the basal and septal segments.  Left ventricular diastolic parameters are consistent with Grade I  diastolic dysfunction (impaired relaxation).  2. Right ventricular systolic function is normal. The right ventricular  size is normal.  3. Left atrial size was moderately dilated.  4. The mitral valve is normal in structure. Mild mitral valve  regurgitation. No evidence of mitral stenosis.  5. The aortic valve is tricuspid. There is mild calcification of the  aortic valve. Aortic valve regurgitation is not visualized. Mild aortic  valve sclerosis is present, with no evidence of aortic valve stenosis.  6. Aortic dilatation noted. There is mild dilatation of the aortic root,  measuring 40 mm.  7. The inferior vena cava is normal in size with greater than 50%  respiratory variability, suggesting right atrial pressure of 3 mmHg.   TEE 01/25/21:  1. Left ventricular ejection fraction, by estimation, is 20 to 25%. The  left ventricle has severely decreased function. The left ventricle  demonstrates global hypokinesis. The left ventricular internal cavity size  was severely dilated.  2. Right ventricular systolic function  is normal. The right ventricular  size is normal.  3. Left atrial size was  moderately dilated. No left atrial/left atrial  appendage thrombus was detected.  4. Right atrial size was mildly dilated.  5. The mitral valve is normal in structure. Mild mitral valve  regurgitation.  6. The aortic valve is tricuspid. Aortic valve regurgitation is not  visualized. No aortic stenosis is present.  7. Agitated saline contrast bubble study was negative, with no evidence  of any interatrial shunt.   Conclusion(s)/Recommendation(s): No LA/LAA thrombus identified. Negative  bubble study for interatrial shunt. No intracardiac source of embolism  detected on this on this transesophageal echocardiogram. Severely reduced  LVEF.   Patient Profile     60 y.o. male with no significant PMH who was seen for the evaluation of new cardiomyopathy following an acute CVA.   Assessment & Plan    1. New cardiomyopathy: Pt presented with acute CVA with aphasia and right sided facial droop with MRI imaging showing left frontal MCA branch infarct>>CT angiogram showed no large vessel stenosis or occlusion. Transthoracic echo with LVEF=25-30%, LVH. TEE with LVEF=20-25% with global hypokinesis, mild MR. No evidence of intracardiac shunting. No LV or LAA thrombus noted.  -Will plan right and left heart cath tomorrow to assess pressures and exclude CAD -Continue Entresto.  -Add spironolactone before discharge but will not start today given mild worsening of renal function.  -Continue Coreg   2. Left MCA CVA: Pt had acute onset of aphasia with right facial droop and a code stroke was called. MRI with left MCA stroke. CTA with no significant atherosclerosis. His stroke could be cardio embolic given the severe global LV systolic dysfunction but no evidence of intra-cardiac thrombus on TTE or TEE.  - Will arrange limited echo today with [facility] to better assess the LV apex and exclude thrombus - Will ask EP to review  findings and consider a loop recorder per request of Neuro team  - Plan per neuro is for DAPT with ASA and Plavix for 3 weeks, then ASA alone thereafter   3. HTN: BP tolerating addition of Entresto.  4. HLD: LDL, 85 with LDL goal <70 given CVA.  -Continue statin  For questions or updates, please contact [facility] Please consult [email or web address] for contact info under        Signed, Verne Carrow, MD  01/26/2021, 10:40 AM

## 2021-01-26 NOTE — Progress Notes (Signed)
Heart Failure Nurse Navigator Progress Note  PCP: Pcp, No PCP-Cardiologist: none on file. Admission Diagnosis: CVA Admitted from: home with spouse  Presentation:   Todd Vega presented with difficulty speaking--L MCA stroke. Pt laying in bed, participated in interview converstaion, utilized white board and slow verbal communication d/t expressive aphasia. ECHO completed new CHF. Pt stated he does not have insurance, no PCP. Lives with sister. Pt stated he was driving prior to hospitalization and wanted to know when he can drive again, deferred to Neurology team. Will set up with [facility]. Pt was not taking any prescriptions prior to admission.  ECHO/ LVEF: 25-30%  Clinical Course:  Past Medical History:  Diagnosis Date  . Asthma due to seasonal allergies      Social History   Socioeconomic History  . Marital status: Divorced    Spouse name: Not on file  . Number of children: 0  . Years of education: Not on file  . Highest education level: Some college, no degree  Occupational History  . Not on file  Tobacco Use  . Smoking status: Never Smoker  . Smokeless tobacco: Never Used  Vaping Use  . Vaping Use: Never used  Substance and Sexual Activity  . Alcohol use: Yes    Alcohol/week: 6.0 standard drinks    Types: 6 Cans of beer per week  . Drug use: Never  . Sexual activity: Not on file  Other Topics Concern  . Not on file  Social History Narrative  . Not on file   Social Determinants of Health   Financial Resource Strain: Low Risk   . Difficulty of Paying Living Expenses: Not very hard  Food Insecurity: No Food Insecurity  . Worried About Programme researcher, broadcasting/film/video in the Last Year: Never true  . Ran Out of Food in the Last Year: Never true  Transportation Needs: No Transportation Needs  . Lack of Transportation (Medical): No  . Lack of Transportation (Non-Medical): No  Physical Activity: Not on file  Stress: Not on file  Social Connections: Not on file     High Risk Criteria for Readmission and/or Poor Patient Outcomes:  Heart failure hospital admissions (last 6 months): 1   No Show rate: N/A  Difficult social situation: yes  Demonstrates medication adherence: N/A  Primary Language: English  Literacy level: able to read/write and comprehend. Positive for expressive aphasia--but improving per neuro notes.   Barriers of Care:   -new CHF -no PCP -no Cardiology -medication compliance -financial strain -no insurance  Considerations/Referrals:   Referral made to Heart Failure Pharmacist Stewardship: yes, appreciated Referral made to Heart & Vascular TOC clinic: pending, will place closer to DC date.  Items for Follow-up on DC/TOC: -PCP -Cardiology -SW: insurance, financial strain. -medication compliance/cost. -continue HF education.  Ozella Rocks, RN, BSN Heart Failure Nurse Navigator [phone]

## 2021-01-26 NOTE — Progress Notes (Addendum)
Occupational Therapy Treatment Patient Details Name: Todd Vega MRN: [id number] DOB: [birth year] Today's Date: 01/26/2021    History of present illness 60 yo male admitted 01/23/21 with R side weakness, R facial droop and aphasia. imaging (+) L frontal (brocas area) MCA infarct remote L cerebellar infarct.  PMH: none.   OT comments  Pt making steady progress towards OT goals this session. Session focus on completing 5 step path finding task, higher level balance tasks ( such as side stepping and changing directions) and continued screening of visual deficits. Pt required MAX cues to accurately complete pathfinding task, pt with no issues following one step commands but required MAX cues to sequence 2/ 3 step commands. Pt also with difficulty problem solving through mistakes. Pt completed table top visual scanning tasks with pt completing L<>R tracking activity with 100% accuracy but needed assist to problem solve through bisecting shapes on paper as pt initially drew one line down L side of paper with no awareness to mistake. Pt would continue to benefit from skilled occupational therapy while admitted and after d/c to address the below listed limitations in order to improve overall functional mobility and facilitate independence with BADL participation. DC plan remains appropriate, will follow acutely per POC.     Follow Up Recommendations  Outpatient OT    Equipment Recommendations  None recommended by OT    Recommendations for Other Services      Precautions / Restrictions Precautions Precautions: None Restrictions Weight Bearing Restrictions: No       Mobility Bed Mobility Overal bed mobility: Modified Independent             General bed mobility comments: sitting EOB upon arrival and left EOB at end of session    Transfers Overall transfer level: Independent Equipment used: None Transfers: Sit to/from Stand Sit to Stand: Modified independent (Device/Increase time)          General transfer comment: no physical assist    Balance Overall balance assessment: No apparent balance deficits (not formally assessed)                                         ADL either performed or assessed with clinical judgement   ADL Overall ADL's : Needs assistance/impaired Eating/Feeding: Modified independent Eating/Feeding Details (indicate cue type and reason): sitting EOB to eat breakfast                     Toilet Transfer: Modified Independent;Ambulation Toilet Transfer Details (indicate cue type and reason): simulated via functional mobility         Functional mobility during ADLs: Modified independent General ADL Comments: session focus on 5 step pathfinding task with pt needing MAX cues to sequence steps, pt completing side stepping and changing directions during task. pt continues to be limited by aphasia but following all one step commands     Vision Baseline Vision/History: Wears glasses Wears Glasses: At all times Additional Comments: pt completing table top visual tasks with pt able to complete visual tracking task from L<>R with pt needing most assist cues for following directions and sequencing task. for example, instructions were written to draw a line down the middle of each square, however pt drew one singular line down all paper with no awareness to mistake until COTA alerted pt. pt able to attend to barriers in hallway but did require increased cues to locate  poster on wall on pts L side during pathfinding task   Perception     Praxis      Cognition Arousal/Alertness: Awake/alert Behavior During Therapy: WFL for tasks assessed/performed Overall Cognitive Status: Difficult to assess Area of Impairment: Following commands;Problem solving                       Following Commands: Follows one step commands consistently;Follows multi-step commands with increased time     Problem Solving: Slow  processing;Difficulty sequencing;Requires verbal cues;Requires tactile cues General Comments: pt needed MAX cues to complete 5 step pathfinding task. issued pt written instructions that included exiting room to R side, locating poster on wall, and locating specific stimulus on poster, wallking to RN station, picking up newspaper and returning back to room. pt needed most assist with locating poster on wall as pt initially passed it and needed max cues to problem nsolve turning around and locating poster now on L side of hallway. pt following one step commands with no issues but required assist to breakdown 2 step and 3 step commands. pt making minimally efforts to verbalize but does state yes and no appropriatley.        Exercises     Shoulder Instructions       General Comments      Pertinent Vitals/ Pain       Pain Assessment: Faces Faces Pain Scale: No hurt  Home Living                                          Prior Functioning/Environment              Frequency  Min 2X/week        Progress Toward Goals  OT Goals(current goals can now be found in the care plan section)  Progress towards OT goals: Progressing toward goals  Acute Rehab OT Goals Patient Stated Goal: none stated Time For Goal Achievement: 02/07/21 Potential to Achieve Goals: Good  Plan Discharge plan remains appropriate;Frequency remains appropriate    Co-evaluation                 AM-PAC OT "6 Clicks" Daily Activity     Outcome Measure   Help from another person eating meals?: None Help from another person taking care of personal grooming?: None Help from another person toileting, which includes using toliet, bedpan, or urinal?: A Little Help from another person bathing (including washing, rinsing, drying)?: A Little Help from another person to put on and taking off regular upper body clothing?: None Help from another person to put on and taking off regular lower body  clothing?: None 6 Click Score: 22    End of Session    OT Visit Diagnosis: Unsteadiness on feet (R26.81)   Activity Tolerance Patient tolerated treatment well   Patient Left in bed;with call bell/phone within reach;Other (comment) (sitting EOB)   Nurse Communication Mobility status        Time: 6283-1517 OT Time Calculation (min): 17 min  Charges: OT General Charges $OT Visit: 1 Visit OT Treatments $Therapeutic Activity: 8-22 mins  Lenor Derrick., COTA/L Acute Rehabilitation Services [phone]    Barron Schmid 01/26/2021, 9:55 AM

## 2021-01-26 NOTE — Interval H&P Note (Signed)
Cath Lab Visit (complete for each Cath Lab visit)  Clinical Evaluation Leading to the Procedure:   ACS: No.  Non-ACS:    Anginal Classification: CCS III  Anti-ischemic medical therapy: Minimal Therapy (1 class of medications)  Non-Invasive Test Results: No non-invasive testing performed  Prior CABG: No previous CABG      History and Physical Interval Note:  01/26/2021 7:21 PM  Todd Vega  has presented today for surgery, with the diagnosis of cardiomyopathy.  The various methods of treatment have been discussed with the patient and family. After consideration of risks, benefits and other options for treatment, the patient has consented to  Procedure(s): RIGHT/LEFT HEART CATH AND CORONARY ANGIOGRAPHY (N/A) as a surgical intervention.  The patient's history has been reviewed, patient examined, no change in status, stable for surgery.  I have reviewed the patient's chart and labs.  Questions were answered to the patient's satisfaction.     Lyn Records III

## 2021-01-26 NOTE — H&P (View-Only) (Signed)
 Progress Note  Patient Name: Todd Vega Date of Encounter: 01/26/2021  [facility] Cardiologist: Philip Nahser, MD   Subjective   No chest pain or dyspnea. No events overnight.   Inpatient Medications    Scheduled Meds: . aspirin EC  81 mg Oral Daily  . atorvastatin  20 mg Oral Daily  . carvedilol  6.25 mg Oral BID WC  . clopidogrel  75 mg Oral Daily  . folic acid  1 mg Oral Daily  . multivitamin with minerals  1 tablet Oral Daily  . sacubitril-valsartan  1 tablet Oral BID  . sodium chloride flush  3 mL Intravenous Q12H  . thiamine  100 mg Oral Daily   Continuous Infusions: . sodium chloride 10 mL/hr at 01/24/21 1029   PRN Meds: sodium chloride, acetaminophen **OR** acetaminophen (TYLENOL) oral liquid 160 mg/5 mL **OR** acetaminophen, sodium chloride flush   Vital Signs    Vitals:   01/26/21 0127 01/26/21 0420 01/26/21 0730 01/26/21 0914  BP:  111/82 131/84 111/74  Pulse: (!) 104 77 80 88  Resp:  19 19   Temp:  97.7 F (36.5 C) 98.2 F (36.8 C)   TempSrc:  Oral Oral   SpO2:  100% 100%   Weight:        Intake/Output Summary (Last 24 hours) at 01/26/2021 1040 Last data filed at 01/26/2021 0637 Gross per 24 hour  Intake 381.68 ml  Output --  Net 381.68 ml   Last 3 Weights 01/23/2021  Weight (lbs) 133 lb 2.5 oz  Weight (kg) 60.4 kg      Telemetry    Sinus- Personally Reviewed  ECG    No new tracing   Physical Exam   General: Well developed, well nourished, NAD  HEENT: OP clear, mucus membranes moist  SKIN: warm, dry. No rashes. Neuro: No focal deficits, expressive aphasia Musculoskeletal: Muscle strength 5/5 all ext  Psychiatric: Mood and affect normal  Neck: No JVD, no carotid bruits, no thyromegaly, no lymphadenopathy.  Lungs:Clear bilaterally, no wheezes, rhonci, crackles Cardiovascular: Regular rate and rhythm. No murmurs, gallops or rubs. Abdomen:Soft. Bowel sounds present. Non-tender.  Extremities: No lower extremity edema. Pulses  are 2 + in the bilateral DP/PT.  Labs    High Sensitivity Troponin:  No results for input(s): TROPONINIHS in the last 720 hours.    Chemistry Recent Labs  Lab 01/23/21 0531 01/23/21 0535 01/24/21 1105 01/26/21 0427  NA 133* 135 133* 133*  K 4.1 4.1 3.9 4.0  CL 100 103 100 100  CO2 20*  --  23 21*  GLUCOSE 116* 112* 112* 103*  BUN 14 16 14 20  CREATININE 1.09 1.00 1.20 1.28*  CALCIUM 8.8*  --  9.1 8.8*  PROT 7.7  --   --   --   ALBUMIN 3.6  --   --   --   AST 34  --   --   --   ALT 18  --   --   --   ALKPHOS 71  --   --   --   BILITOT 0.3  --   --   --   GFRNONAA >60  --  >60 >60  ANIONGAP 13  --  10 12     Hematology Recent Labs  Lab 01/23/21 0531 01/23/21 0535  WBC 3.3*  --   RBC 5.61  --   HGB 14.5 17.0  HCT 44.3 50.0  MCV 79.0*  --   MCH 25.8*  --     MCHC 32.7  --   RDW 14.8  --   PLT 154  --     BNPNo results for input(s): BNP, PROBNP in the last 168 hours.   DDimer No results for input(s): DDIMER in the last 168 hours.   Radiology    VAS [place] TRANSCRANIAL DOPPLER W BUBBLES  Result Date: 01/25/2021  Transcranial Doppler with Bubble Indications: Stroke. History: Small acute cortical infarct in the left frontal region. Comparison Study: No prior study on file Performing Technologist: Sherren Kerns RVS Supporting Technologist: Jean Rosenthal RDMS,RVT  Examination Guidelines: A complete evaluation includes B-mode imaging, spectral Doppler, color Doppler, and power Doppler as needed of all accessible portions of each vessel. Bilateral testing is considered an integral part of a complete examination. Limited examinations for reoccurring indications may be performed as noted.  Summary: No HITS at rest or during Valsalva. Negative transcranial Doppler Bubble study with no evidence of right to left intracardiac communication.  *See table(s) above for TCD measurements and observations.    Preliminary    ECHO TEE  Result Date: 01/25/2021    TRANSESOPHOGEAL ECHO REPORT    Patient Name:   Todd Vega Date of Exam: 01/25/2021 Medical Rec #:  [id number]     Height: Accession #:    [id number]    Weight:       133.2 lb Date of Birth:  1961-06-27      BSA:          1.540 m Patient Age:    60 years      BP:           116/77 mmHg Patient Gender: M             HR:           100 bpm. Exam Location:  Inpatient Procedure: Transesophageal Echo, Cardiac Doppler, Color Doppler and Saline            Contrast Bubble Study Indications:     CVA  History:         Patient has prior history of Echocardiogram examinations, most                  recent 01/23/2021. Cardiomyopathy, Stroke; Risk                  Factors:Hypertension and Dyslipidemia.  Sonographer:     Lavenia Atlas Referring Phys:  [id number] Kathleene Hazel Diagnosing Phys: Jodelle Red MD PROCEDURE: After discussion of the risks and benefits of a TEE, an informed consent was obtained from the patient. The transesophogeal probe was passed without difficulty through the esophogus of the patient. Sedation performed by different physician. The patient was monitored while under deep sedation. Anesthestetic sedation was provided intravenously by Anesthesiology: 209mg  of Propofol, 60mg  of Lidocaine. Image quality was good. The patient's vital signs; including heart rate, blood pressure, and oxygen saturation; remained stable throughout the procedure. The patient developed no complications during the procedure. IMPRESSIONS  1. Left ventricular ejection fraction, by estimation, is 20 to 25%. The left ventricle has severely decreased function. The left ventricle demonstrates global hypokinesis. The left ventricular internal cavity size was severely dilated.  2. Right ventricular systolic function is normal. The right ventricular size is normal.  3. Left atrial size was moderately dilated. No left atrial/left atrial appendage thrombus was detected.  4. Right atrial size was mildly dilated.  5. The mitral valve is normal in structure. Mild  mitral valve regurgitation.  6. The aortic valve is  tricuspid. Aortic valve regurgitation is not visualized. No aortic stenosis is present.  7. Agitated saline contrast bubble study was negative, with no evidence of any interatrial shunt. Conclusion(s)/Recommendation(s): No LA/LAA thrombus identified. Negative bubble study for interatrial shunt. No intracardiac source of embolism detected on this on this transesophageal echocardiogram. Severely reduced LVEF. FINDINGS  Left Ventricle: Left ventricular ejection fraction, by estimation, is 20 to 25%. The left ventricle has severely decreased function. The left ventricle demonstrates global hypokinesis. The left ventricular internal cavity size was severely dilated. Right Ventricle: The right ventricular size is normal. No increase in right ventricular wall thickness. Right ventricular systolic function is normal. Left Atrium: Left atrial size was moderately dilated. No left atrial/left atrial appendage thrombus was detected. Right Atrium: Right atrial size was mildly dilated. Pericardium: There is no evidence of pericardial effusion. Mitral Valve: The mitral valve is normal in structure. Mild mitral valve regurgitation. There is no evidence of mitral valve vegetation. Tricuspid Valve: The tricuspid valve is normal in structure. Tricuspid valve regurgitation is trivial. No evidence of tricuspid stenosis. There is no evidence of tricuspid valve vegetation. Aortic Valve: The aortic valve is tricuspid. Aortic valve regurgitation is not visualized. No aortic stenosis is present. Aortic valve peak gradient measures 2.5 mmHg. There is no evidence of aortic valve vegetation. Pulmonic Valve: The pulmonic valve was grossly normal. Pulmonic valve regurgitation is trivial. There is no evidence of pulmonic valve vegetation. Aorta: The aortic root and ascending aorta are structurally normal, with no evidence of dilitation. IAS/Shunts: The interatrial septum appears to be lipomatous.  No atrial level shunt detected by color flow Doppler. Agitated saline contrast was given intravenously to evaluate for intracardiac shunting. Agitated saline contrast bubble study was negative,  with no evidence of any interatrial shunt.  AORTIC VALVE AV Vmax:      78.65 cm/s AV Peak Grad: 2.5 mmHg Jodelle Red MD Electronically signed by Jodelle Red MD Signature Date/Time: 01/25/2021/2:11:52 PM    Final     Cardiac Studies   Echo: 01/23/21  IMPRESSIONS    1. Left ventricular ejection fraction, by estimation, is 25 to 30%. The  left ventricle has severely decreased function. The left ventricle  demonstrates global hypokinesis. The left ventricular internal cavity size  was severely dilated. There is mild  asymmetric left ventricular hypertrophy of the basal and septal segments.  Left ventricular diastolic parameters are consistent with Grade I  diastolic dysfunction (impaired relaxation).  2. Right ventricular systolic function is normal. The right ventricular  size is normal.  3. Left atrial size was moderately dilated.  4. The mitral valve is normal in structure. Mild mitral valve  regurgitation. No evidence of mitral stenosis.  5. The aortic valve is tricuspid. There is mild calcification of the  aortic valve. Aortic valve regurgitation is not visualized. Mild aortic  valve sclerosis is present, with no evidence of aortic valve stenosis.  6. Aortic dilatation noted. There is mild dilatation of the aortic root,  measuring 40 mm.  7. The inferior vena cava is normal in size with greater than 50%  respiratory variability, suggesting right atrial pressure of 3 mmHg.   TEE 01/25/21:  1. Left ventricular ejection fraction, by estimation, is 20 to 25%. The  left ventricle has severely decreased function. The left ventricle  demonstrates global hypokinesis. The left ventricular internal cavity size  was severely dilated.  2. Right ventricular systolic function  is normal. The right ventricular  size is normal.  3. Left atrial size was  moderately dilated. No left atrial/left atrial  appendage thrombus was detected.  4. Right atrial size was mildly dilated.  5. The mitral valve is normal in structure. Mild mitral valve  regurgitation.  6. The aortic valve is tricuspid. Aortic valve regurgitation is not  visualized. No aortic stenosis is present.  7. Agitated saline contrast bubble study was negative, with no evidence  of any interatrial shunt.   Conclusion(s)/Recommendation(s): No LA/LAA thrombus identified. Negative  bubble study for interatrial shunt. No intracardiac source of embolism  detected on this on this transesophageal echocardiogram. Severely reduced  LVEF.   Patient Profile     60 y.o. male with no significant PMH who was seen for the evaluation of new cardiomyopathy following an acute CVA.   Assessment & Plan    1. New cardiomyopathy: Pt presented with acute CVA with aphasia and right sided facial droop with MRI imaging showing left frontal MCA branch infarct>>CT angiogram showed no large vessel stenosis or occlusion. Transthoracic echo with LVEF=25-30%, LVH. TEE with LVEF=20-25% with global hypokinesis, mild MR. No evidence of intracardiac shunting. No LV or LAA thrombus noted.  -Will plan right and left heart cath tomorrow to assess pressures and exclude CAD -Continue Entresto.  -Add spironolactone before discharge but will not start today given mild worsening of renal function.  -Continue Coreg   2. Left MCA CVA: Pt had acute onset of aphasia with right facial droop and a code stroke was called. MRI with left MCA stroke. CTA with no significant atherosclerosis. His stroke could be cardio embolic given the severe global LV systolic dysfunction but no evidence of intra-cardiac thrombus on TTE or TEE.  - Will arrange limited echo today with Definity to better assess the LV apex and exclude thrombus - Will ask EP to review  findings and consider a loop recorder per request of Neuro team  - Plan per neuro is for DAPT with ASA and Plavix for 3 weeks, then ASA alone thereafter   3. HTN: BP tolerating addition of Entresto.  4. HLD: LDL, 85 with LDL goal <70 given CVA.  -Continue statin  For questions or updates, please contact [facility] Please consult [email or web address] for contact info under        Signed, Verne Carrow, MD  01/26/2021, 10:40 AM

## 2021-01-26 NOTE — Progress Notes (Signed)
  Echocardiogram 2D Echocardiogram has been performed.  Janalyn Harder 01/26/2021, 3:54 PM

## 2021-01-26 NOTE — Progress Notes (Signed)
PROGRESS NOTE    Todd Vega Vega  UEA:[id number] DOB: 06/18/1961 DOA: 01/23/2021 PCP: Pcp, No    Chief Complaint  Patient presents with  . Code Stroke    Brief Narrative:  Todd Vega Hargrove is a 60 y.o. male with no significant  past medical history comes in after waking up in the middle night and waking up his wife for aphasia and right-sided facial droop.  Patient has not seen a physician in over 5 years and has no known medical issues except allergies for which he takes Allegra.  He denies any weakness numbness or tingling in his extremities.  Code stroke was called teleneurology has seen the patient.  Not a TPA candidate.  Patient does indeed in fact show an infarct in MCA territory. He was started on asprin, plavix and statin. Neurology recommends TEE, which was negative for intraatrial shunt. cardiology consulted for combined systolic and diastolic heart failure, and he is scheduled for right and left cath in am.  Pt seen and examined at bedside. No new complaints.    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 - admitted for further evaluation.  Neurology on board. Started him on aspirin and plavix,to continue  for 21 days followed by aspirin use.  LDL is 85,  Start lipitor 20 mg daily. hemoglobin A1c is 5.6 Permissive hypertension.  Cardiology consulted for TEE. TEE done on 01/25/21 and is negative for intraatrial shunt.  echocardiogram showed severe LV dysfunction, LVEF of 20 to 25%, global hypokinesis.  Waiting for loop recorder placement . Cardiology recommends left and right heart cath tomorrow.  Cardiology consulted, for combined systolic and diastolic heart failure.  Therapy evaluation recommending home health OT and SLP    Combined chronic systolic and diastolic heart failure.   Cardiology on board, and he was started on coreg 6.25 mg bid, along with entresto 24-26 mg BID.   Alcohol abuse:  Alcohol level at 42. Watch for withdrawal symptoms.  TOC will be consulted for substance abuse.    Hypertension:  Permissive Hypertension.    Mild hyponatremia:  Asymptomatic.     Hypomagnesemia:  Replaced. Repeat level wnl.    DVT prophylaxis:SCD'S Code Status: full code.  Family Communication: (none at bedside) Disposition:   Status is: Inpatient  Remains inpatient appropriate because:Ongoing diagnostic testing needed not appropriate for outpatient work up   Dispo: The patient is from: Home              Anticipated d/c is to: Home              Patient currently is not medically stable to d/c.   Difficult to place patient No       Consultants:   Neurology  Cardiology   Procedures: MRI of the brain  Echocardiogram  Left and right heart cath scheduled for tomorrow.   Antimicrobials:  None.    Subjective:  Pt remains dysarthric, no chest pain or sob.  Objective: Vitals:   01/26/21 0127 01/26/21 0420 01/26/21 0730 01/26/21 0914  BP:  111/82 131/84 111/74  Pulse: (!) 104 77 80 88  Resp:  19 19   Temp:  97.7 F (36.5 C) 98.2 F (36.8 C)   TempSrc:  Oral Oral   SpO2:  100% 100%   Weight:  Intake/Output Summary (Last 24 hours) at 01/26/2021 1151 Last data filed at 01/26/2021 1761 Gross per 24 hour  Intake 378.68 ml  Output --  Net 378.68 ml   Filed Weights   01/23/21 0556  Weight: 60.4 kg    Examination:  General exam: well developed gentleman, no distress noted.  Respiratory system: clear to auscultation, no wheezing or rhonchi.  Cardiovascular system: S1-S2 heard,RRR, no JVD, no pedal edema.  Gastrointestinal system: Abdomen is soft, NT ND BS+ Central nervous system: Alert and oriented, pt remains dysarthric.  Extremities: No pedal edema.  Skin: No rashes seen.  psychiatry: Mood is appropriate    Data  Reviewed: I have personally reviewed following labs and imaging studies  CBC: Recent Labs  Lab 01/23/21 0531 01/23/21 0535  WBC 3.3*  --   NEUTROABS 2.1  --   HGB 14.5 17.0  HCT 44.3 50.0  MCV 79.0*  --   PLT 154  --     Basic Metabolic Panel: Recent Labs  Lab 01/23/21 0531 01/23/21 0535 01/23/21 0851 01/24/21 1105 01/26/21 0427  NA 133* 135  --  133* 133*  K 4.1 4.1  --  3.9 4.0  CL 100 103  --  100 100  CO2 20*  --   --  23 21*  GLUCOSE 116* 112*  --  112* 103*  BUN 14 16  --  14 20  CREATININE 1.09 1.00  --  1.20 1.28*  CALCIUM 8.8*  --   --  9.1 8.8*  MG  --   --  1.5*  --  2.0  PHOS  --   --  3.2  --   --     GFR: CrCl cannot be calculated (Unknown ideal weight.).  Liver Function Tests: Recent Labs  Lab 01/23/21 0531  AST 34  ALT 18  ALKPHOS 71  BILITOT 0.3  PROT 7.7  ALBUMIN 3.6    CBG: Recent Labs  Lab 01/23/21 0530  GLUCAP 117*     Recent Results (from the past 240 hour(s))  Resp Panel by RT-PCR (Flu A&B, Covid) Nasopharyngeal Swab     Status: None   Collection Time: 01/23/21  5:31 AM   Specimen: Nasopharyngeal Swab; Nasopharyngeal(NP) swabs in vial transport medium  Result Value Ref Range Status   SARS Coronavirus 2 by RT PCR NEGATIVE NEGATIVE Final    Comment: (NOTE) SARS-CoV-2 target nucleic acids are NOT DETECTED.  The SARS-CoV-2 RNA is generally detectable in upper respiratory specimens during the acute phase of infection. The lowest concentration of SARS-CoV-2 viral copies this assay can detect is 138 copies/mL. A negative result does not preclude SARS-Cov-2 infection and should not be used as the sole basis for treatment or other patient management decisions. A negative result may occur with  improper specimen collection/handling, submission of specimen other than nasopharyngeal swab, presence of viral mutation(s) within the areas targeted by this assay, and inadequate number of viral copies(<138 copies/mL). A negative result  must be combined with clinical observations, patient history, and epidemiological information. The expected result is Negative.  Fact Sheet for Patients:  BloggerCourse.com  Fact Sheet for Healthcare Providers:  SeriousBroker.it  This test is no t yet approved or cleared by the [place] FDA and  has been authorized for detection and/or diagnosis of SARS-CoV-2 by FDA under an Emergency Use Authorization (EUA). This EUA will remain  in effect (meaning this test can be used) for the duration of the COVID-19 declaration under Section 564(b)(1) of the Act,  21 U.S.C.section 360bbb-3(b)(1), unless the authorization is terminated  or revoked sooner.       Influenza A by PCR NEGATIVE NEGATIVE Final   Influenza B by PCR NEGATIVE NEGATIVE Final    Comment: (NOTE) The Xpert Xpress SARS-CoV-2/FLU/RSV plus assay is intended as an aid in the diagnosis of influenza from Nasopharyngeal swab specimens and should not be used as a sole basis for treatment. Nasal washings and aspirates are unacceptable for Xpert Xpress SARS-CoV-2/FLU/RSV testing.  Fact Sheet for Patients: BloggerCourse.com  Fact Sheet for Healthcare Providers: SeriousBroker.it  This test is not yet approved or cleared by the [place] FDA and has been authorized for detection and/or diagnosis of SARS-CoV-2 by FDA under an Emergency Use Authorization (EUA). This EUA will remain in effect (meaning this test can be used) for the duration of the COVID-19 declaration under Section 564(b)(1) of the Act, 21 U.S.C. section 360bbb-3(b)(1), unless the authorization is terminated or revoked.  Performed at [facility], [address], [place], [place] [address]          Radiology Studies: VAS [place] TRANSCRANIAL DOPPLER W BUBBLES  Result Date: 01/25/2021  Transcranial Doppler with Bubble Indications: Stroke. History:  Small acute cortical infarct in the left frontal region. Comparison Study: No prior study on file Performing Technologist: Sherren Kerns RVS Supporting Technologist: Jean Rosenthal RDMS,RVT  Examination Guidelines: A complete evaluation includes B-mode imaging, spectral Doppler, color Doppler, and power Doppler as needed of all accessible portions of each vessel. Bilateral testing is considered an integral part of a complete examination. Limited examinations for reoccurring indications may be performed as noted.  Summary: No HITS at rest or during Valsalva. Negative transcranial Doppler Bubble study with no evidence of right to left intracardiac communication.  *See table(s) above for TCD measurements and observations.    Preliminary    ECHO TEE  Result Date: 01/25/2021    TRANSESOPHOGEAL ECHO REPORT   Patient Name:   Keegen AMBS Date of Exam: 01/25/2021 Medical Rec #:  [id number]     Height: Accession #:    [id number]    Weight:       133.2 lb Date of Birth:  December 29, 1960      BSA:          1.540 m Patient Age:    60 years      BP:           116/77 mmHg Patient Gender: M             HR:           100 bpm. Exam Location:  Inpatient Procedure: Transesophageal Echo, Cardiac Doppler, Color Doppler and Saline            Contrast Bubble Study Indications:     CVA  History:         Patient has prior history of Echocardiogram examinations, most                  recent 01/23/2021. Cardiomyopathy, Stroke; Risk                  Factors:Hypertension and Dyslipidemia.  Sonographer:     Lavenia Atlas Referring Phys:  [id number] Kathleene Hazel Diagnosing Phys: Jodelle Red MD PROCEDURE: After discussion of the risks and benefits of a TEE, an informed consent was obtained from the patient. The transesophogeal probe was passed without difficulty through the esophogus of the patient. Sedation performed by different physician. The patient was monitored while under  deep sedation. Anesthestetic sedation was provided  intravenously by Anesthesiology: 209mg  of Propofol, 60mg  of Lidocaine. Image quality was good. The patient's vital signs; including heart rate, blood pressure, and oxygen saturation; remained stable throughout the procedure. The patient developed no complications during the procedure. IMPRESSIONS  1. Left ventricular ejection fraction, by estimation, is 20 to 25%. The left ventricle has severely decreased function. The left ventricle demonstrates global hypokinesis. The left ventricular internal cavity size was severely dilated.  2. Right ventricular systolic function is normal. The right ventricular size is normal.  3. Left atrial size was moderately dilated. No left atrial/left atrial appendage thrombus was detected.  4. Right atrial size was mildly dilated.  5. The mitral valve is normal in structure. Mild mitral valve regurgitation.  6. The aortic valve is tricuspid. Aortic valve regurgitation is not visualized. No aortic stenosis is present.  7. Agitated saline contrast bubble study was negative, with no evidence of any interatrial shunt. Conclusion(s)/Recommendation(s): No LA/LAA thrombus identified. Negative bubble study for interatrial shunt. No intracardiac source of embolism detected on this on this transesophageal echocardiogram. Severely reduced LVEF. FINDINGS  Left Ventricle: Left ventricular ejection fraction, by estimation, is 20 to 25%. The left ventricle has severely decreased function. The left ventricle demonstrates global hypokinesis. The left ventricular internal cavity size was severely dilated. Right Ventricle: The right ventricular size is normal. No increase in right ventricular wall thickness. Right ventricular systolic function is normal. Left Atrium: Left atrial size was moderately dilated. No left atrial/left atrial appendage thrombus was detected. Right Atrium: Right atrial size was mildly dilated. Pericardium: There is no evidence of pericardial effusion. Mitral Valve: The mitral valve  is normal in structure. Mild mitral valve regurgitation. There is no evidence of mitral valve vegetation. Tricuspid Valve: The tricuspid valve is normal in structure. Tricuspid valve regurgitation is trivial. No evidence of tricuspid stenosis. There is no evidence of tricuspid valve vegetation. Aortic Valve: The aortic valve is tricuspid. Aortic valve regurgitation is not visualized. No aortic stenosis is present. Aortic valve peak gradient measures 2.5 mmHg. There is no evidence of aortic valve vegetation. Pulmonic Valve: The pulmonic valve was grossly normal. Pulmonic valve regurgitation is trivial. There is no evidence of pulmonic valve vegetation. Aorta: The aortic root and ascending aorta are structurally normal, with no evidence of dilitation. IAS/Shunts: The interatrial septum appears to be lipomatous. No atrial level shunt detected by color flow Doppler. Agitated saline contrast was given intravenously to evaluate for intracardiac shunting. Agitated saline contrast bubble study was negative,  with no evidence of any interatrial shunt.  AORTIC VALVE AV Vmax:      78.65 cm/s AV Peak Grad: 2.5 mmHg MD Electronically signed by MD Signature Date/Time: 01/25/2021/2:11:52 PM    Final         Scheduled Meds: . aspirin EC  81 mg Oral Daily  . atorvastatin  20 mg Oral Daily  . carvedilol  6.25 mg Oral BID WC  . clopidogrel  75 mg Oral Daily  . folic acid  1 mg Oral Daily  . multivitamin with minerals  1 tablet Oral Daily  . sacubitril-valsartan  1 tablet Oral BID  . sodium chloride flush  3 mL Intravenous Q12H  . thiamine  100 mg Oral Daily   Continuous Infusions: . sodium chloride 10 mL/hr at 01/24/21 1029     LOS: 3 days        03/27/2021, MD [facility]   To contact the attending  provider between 7A-7P or the covering provider during after hours 7P-7A, please log into the web site [email or web address] and access using universal [facility] password for that web site. If you do not have the password, please call the hospital operator.  01/26/2021, 11:51 AM

## 2021-01-26 NOTE — Progress Notes (Signed)
MD on call notified of pt VS and DBP remaining 110 and vitals at 2000 as well. New order order received. Will continue to closely monitor. Dionne Bucy RN   01/26/21 0041  Vitals  Temp 98.4 F (36.9 C)  Temp Source Oral  BP (!) 150/110  MAP (mmHg) 123  BP Location Left Arm  BP Method Automatic  Patient Position (if appropriate) Lying  Pulse Rate 97  Pulse Rate Source Monitor  Resp 18  Level of Consciousness  Level of Consciousness Alert  MEWS COLOR  MEWS Score Color Green  Oxygen Therapy  SpO2 100 %  O2 Device Room Air  MEWS Score  MEWS Temp 0  MEWS Systolic 0  MEWS Pulse 0  MEWS RR 0  MEWS LOC 0  MEWS Score 0

## 2021-01-26 NOTE — Progress Notes (Signed)
Heart Failure Stewardship Pharmacist Progress Note   PCP: Pcp, No PCP-Cardiologist: No primary care provider on file.    HPI:  60 yo M with no significant PMH. 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 An ECHO was done on 01/23/21 and LVEF was 25-30%. Pending R/LHC - anticipated for 01/27/21.  Current HF Medications: Carvedilol 6.25 mg BID Entresto 24/26 mg BID  Prior to admission HF Medications: None   Pertinent Lab Values: . Serum creatinine 1.28, BUN 20, Potassium 4.0, Sodium 133, Magnesium 2.0   Vital Signs: . Weight: none taken since admission (admission weight: 133 lbs) . Blood pressure: 110-140/70-90s   . Heart rate: 80s  Medication Assistance / Insurance Benefits Check: Does the patient have prescription insurance?  No  Outpatient Pharmacy:  Prior to admission outpatient pharmacy: [facility] Is the patient willing to use [facility] TOC pharmacy at discharge? Yes Is the patient willing to transition their outpatient pharmacy to utilize a [facility] outpatient pharmacy?   Pending    Assessment: 1. Acute systolic CHF (EF 22-63%), due to unknown etiology - pending R/LHC. NYHA class II symptoms. - Continue carvedilol 6.25 mg BID - Continue Entresto 24/26 mg BID - Consider starting spironolactone 12.5 mg daily. May need to wait until post cath given slight SCr bump with anticipated contrast - Consider starting [name] 10 mg daily prior to discharge   Plan: 1) Medication changes recommended at this time: - None pending cath tomorrow  2) Patient assistance: - No insurance  - Will consult HF CSW - Can enroll in patient assistance for Entresto + [name] - will complete during TOC follow up appt if pt is agreeable to coming. If unable to schedule TOC follow up, will plan to submit paperwork prior to discharge.   3)  Education  - To be completed prior to discharge  Sharen Hones, PharmD, BCPS Heart Failure Stewardship  Pharmacist Phone [phone]

## 2021-01-27 ENCOUNTER — Encounter (HOSPITAL_COMMUNITY): Payer: Self-pay | Admitting: Interventional Cardiology

## 2021-01-27 ENCOUNTER — Encounter (HOSPITAL_COMMUNITY)
Admission: EM | Disposition: A | Discharge status: Home / Self Care | Payer: Self-pay | Source: Home / Self Care | Attending: Internal Medicine

## 2021-01-27 DIAGNOSIS — G464 Cerebellar stroke syndrome: Secondary | ICD-10-CM

## 2021-01-27 DIAGNOSIS — I2510 Atherosclerotic heart disease of native coronary artery without angina pectoris: Secondary | ICD-10-CM

## 2021-01-27 DIAGNOSIS — I513 Intracardiac thrombosis, not elsewhere classified: Secondary | ICD-10-CM

## 2021-01-27 HISTORY — PX: RIGHT HEART CATH AND CORONARY ANGIOGRAPHY: CATH118264

## 2021-01-27 LAB — POCT I-STAT EG7
Acid-base deficit: 2 mmol/L (ref 0.0–2.0)
Acid-base deficit: 3 mmol/L — ABNORMAL HIGH (ref 0.0–2.0)
Bicarbonate: 24.3 mmol/L (ref 20.0–28.0)
Bicarbonate: 23.5 mmol/L (ref 20.0–28.0)
Calcium, Ion: 1.24 mmol/L (ref 1.15–1.40)
Calcium, Ion: 1.25 mmol/L (ref 1.15–1.40)
HCT: 45 % (ref 39.0–52.0)
HCT: 46 % (ref 39.0–52.0)
Hemoglobin: 15.3 g/dL (ref 13.0–17.0)
Hemoglobin: 15.6 g/dL (ref 13.0–17.0)
O2 Saturation: 70 %
O2 Saturation: 68 %
Potassium: 3.8 mmol/L (ref 3.5–5.1)
Potassium: 3.8 mmol/L (ref 3.5–5.1)
Sodium: 135 mmol/L (ref 135–145)
Sodium: 134 mmol/L — ABNORMAL LOW (ref 135–145)
TCO2: 26 mmol/L (ref 22–32)
TCO2: 25 mmol/L (ref 22–32)
pCO2, Ven: 44.7 mmHg (ref 44.0–60.0)
pCO2, Ven: 43.9 mmHg — ABNORMAL LOW (ref 44.0–60.0)
pH, Ven: 7.343 (ref 7.250–7.430)
pH, Ven: 7.336 (ref 7.250–7.430)
pO2, Ven: 39 mmHg (ref 32.0–45.0)
pO2, Ven: 38 mmHg (ref 32.0–45.0)

## 2021-01-27 LAB — BASIC METABOLIC PANEL
Anion gap: 8 (ref 5–15)
BUN: 20 mg/dL (ref 6–20)
CO2: 23 mmol/L (ref 22–32)
Calcium: 8.6 mg/dL — ABNORMAL LOW (ref 8.9–10.3)
Chloride: 101 mmol/L (ref 98–111)
Creatinine, Ser: 1.3 mg/dL — ABNORMAL HIGH (ref 0.61–1.24)
GFR, Estimated: >60 mL/min (ref >60)
Glucose, Bld: 91 mg/dL (ref 70–99)
Potassium: 3.8 mmol/L (ref 3.5–5.1)
Sodium: 132 mmol/L — ABNORMAL LOW (ref 135–145)

## 2021-01-27 LAB — POCT I-STAT 7, (LYTES, BLD GAS, ICA,H+H)
Acid-base deficit: 2 mmol/L (ref 0.0–2.0)
Bicarbonate: 22.6 mmol/L (ref 20.0–28.0)
Calcium, Ion: 1.22 mmol/L (ref 1.15–1.40)
HCT: 45 % (ref 39.0–52.0)
Hemoglobin: 15.3 g/dL (ref 13.0–17.0)
O2 Saturation: 99 %
Potassium: 3.8 mmol/L (ref 3.5–5.1)
Sodium: 134 mmol/L — ABNORMAL LOW (ref 135–145)
TCO2: 24 mmol/L (ref 22–32)
pCO2 arterial: 38.6 mmHg (ref 32.0–48.0)
pH, Arterial: 7.375 (ref 7.350–7.450)
pO2, Arterial: 144 mmHg — ABNORMAL HIGH (ref 83.0–108.0)

## 2021-01-27 SURGERY — RIGHT HEART CATH AND CORONARY ANGIOGRAPHY
Anesthesia: LOCAL

## 2021-01-27 MED ORDER — VERAPAMIL HCL 2.5 MG/ML IV SOLN
INTRAVENOUS | Status: AC
  Filled 2021-01-27: qty 2

## 2021-01-27 MED ORDER — FENTANYL CITRATE (PF) 100 MCG/2ML IJ SOLN
INTRAMUSCULAR | Status: DC | PRN
  Administered 2021-01-27: 12.5 ug via INTRAVENOUS

## 2021-01-27 MED ORDER — HYDRALAZINE HCL 20 MG/ML IJ SOLN: 10.0000 mg | INTRAMUSCULAR | Status: DC | PRN

## 2021-01-27 MED ORDER — FENTANYL CITRATE (PF) 100 MCG/2ML IJ SOLN
INTRAMUSCULAR | Status: AC
  Filled 2021-01-27: qty 2

## 2021-01-27 MED ORDER — VERAPAMIL HCL 2.5 MG/ML IV SOLN
INTRAVENOUS | Status: DC | PRN
  Administered 2021-01-27: 10 mL via INTRA_ARTERIAL

## 2021-01-27 MED ORDER — LIDOCAINE HCL (PF) 1 % IJ SOLN
INTRAMUSCULAR | Status: DC | PRN
  Administered 2021-01-27 (×2): 2 mL via SUBCUTANEOUS

## 2021-01-27 MED ORDER — LIDOCAINE HCL (PF) 1 % IJ SOLN
INTRAMUSCULAR | Status: AC
  Filled 2021-01-27: qty 30

## 2021-01-27 MED ORDER — HEPARIN SODIUM (PORCINE) 1000 UNIT/ML IJ SOLN
INTRAMUSCULAR | Status: DC | PRN
  Administered 2021-01-27: 3000 [IU] via INTRAVENOUS

## 2021-01-27 MED ORDER — ACETAMINOPHEN 325 MG PO TABS: 650.0000 mg | ORAL_TABLET | ORAL | Status: DC | PRN

## 2021-01-27 MED ORDER — MIDAZOLAM HCL 2 MG/2ML IJ SOLN
INTRAMUSCULAR | Status: AC
  Filled 2021-01-27: qty 2

## 2021-01-27 MED ORDER — HEPARIN SODIUM (PORCINE) 1000 UNIT/ML IJ SOLN
INTRAMUSCULAR | Status: AC
  Filled 2021-01-27: qty 1

## 2021-01-27 MED ORDER — HEPARIN (PORCINE) IN NACL 1000-0.9 UT/500ML-% IV SOLN
INTRAVENOUS | Status: AC
  Filled 2021-01-27: qty 1500

## 2021-01-27 MED ORDER — OXYCODONE HCL 5 MG PO TABS: 5.0000 mg | ORAL_TABLET | ORAL | Status: DC | PRN

## 2021-01-27 MED ORDER — LABETALOL HCL 5 MG/ML IV SOLN: 10.0000 mg | INTRAVENOUS | Status: DC | PRN

## 2021-01-27 MED ORDER — MIDAZOLAM HCL 2 MG/2ML IJ SOLN
INTRAMUSCULAR | Status: DC | PRN
  Administered 2021-01-27: 0.5 mg via INTRAVENOUS

## 2021-01-27 MED ORDER — HEPARIN (PORCINE) 25000 UT/250ML-% IV SOLN
900.0000 [IU]/h | INTRAVENOUS | Status: DC
  Administered 2021-01-27: 750 [IU]/h via INTRAVENOUS
  Filled 2021-01-27: qty 250

## 2021-01-27 MED ORDER — LORATADINE 10 MG PO TABS
10.0000 mg | ORAL_TABLET | Freq: Every day | ORAL | Status: DC
  Administered 2021-01-27 – 2021-01-28 (×2): 10 mg via ORAL
  Filled 2021-01-27 (×2): qty 1

## 2021-01-27 MED ORDER — SODIUM CHLORIDE 0.9 % IV SOLN: INTRAVENOUS | Status: DC

## 2021-01-27 MED ORDER — SODIUM CHLORIDE 0.9 % IV SOLN: 250.0000 mL | INTRAVENOUS | Status: DC | PRN

## 2021-01-27 MED ORDER — ONDANSETRON HCL 4 MG/2ML IJ SOLN: 4.0000 mg | Freq: Four times a day (QID) | INTRAMUSCULAR | Status: DC | PRN

## 2021-01-27 MED ORDER — HEPARIN (PORCINE) IN NACL 1000-0.9 UT/500ML-% IV SOLN
INTRAVENOUS | Status: DC | PRN
  Administered 2021-01-27 (×2): 500 mL

## 2021-01-27 SURGICAL SUPPLY — 13 items
CATH 5FR JL3.5 JR4 ANG PIG MP (CATHETERS) ×1 IMPLANT
CATH SWAN GANZ 7F STRAIGHT (CATHETERS) ×1 IMPLANT
DEVICE RAD COMP TR BAND LRG (VASCULAR PRODUCTS) ×1 IMPLANT
GLIDESHEATH SLEND A-KIT 6F 22G (SHEATH) ×1 IMPLANT
GLIDESHEATH SLENDER 7FR .021G (SHEATH) ×1 IMPLANT
GUIDEWIRE .025 260CM (WIRE) ×1 IMPLANT
GUIDEWIRE INQWIRE 1.5J.035X260 (WIRE) IMPLANT
INQWIRE 1.5J .035X260CM (WIRE) ×2
KIT HEART LEFT (KITS) ×2 IMPLANT
PACK CARDIAC CATHETERIZATION (CUSTOM PROCEDURE TRAY) ×2 IMPLANT
SHEATH PROBE COVER 6X72 (BAG) ×1 IMPLANT
TRANSDUCER W/STOPCOCK (MISCELLANEOUS) ×2 IMPLANT
TUBING CIL FLEX 10 FLL-RA (TUBING) ×2 IMPLANT

## 2021-01-27 NOTE — Progress Notes (Signed)
Heart Failure Stewardship Pharmacist Progress Note   PCP: Pcp, No PCP-Cardiologist: [name], MD    HPI:  60 yo M with no significant PMH. 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 An ECHO was done on 01/23/21 and LVEF was 25-30%. Pending R/LHC - anticipated for 01/27/21.  Current HF Medications: Carvedilol 6.25 mg BID Entresto 24/26 mg BID  Prior to admission HF Medications: None   Pertinent Lab Values: . Serum creatinine 1.30, BUN 20, Potassium 3.8, Sodium 132, Magnesium 2.0   Vital Signs: . Weight: 124 lbs (admission weight: 133 lbs) . Blood pressure: 100/70s   . Heart rate: 80s  Medication Assistance / Insurance Benefits Check: Does the patient have prescription insurance?  No  Outpatient Pharmacy:  Prior to admission outpatient pharmacy: [facility] Is the patient willing to use [facility] TOC pharmacy at discharge? Yes Is the patient willing to transition their outpatient pharmacy to utilize a [facility] outpatient pharmacy?   Pending    Assessment: 1. Acute systolic CHF (EF 92-33%), due to unknown etiology - pending R/LHC. NYHA class II symptoms. - Continue carvedilol 6.25 mg BID - Continue Entresto 24/26 mg BID - Consider starting spironolactone 12.5 mg daily. May need to wait until post cath given slight SCr bump with anticipated contrast - Consider starting [name] 10 mg daily prior to discharge   Plan: 1) Medication changes recommended at this time: - None pending cath   2) Patient assistance: - No insurance  - Will consult HF CSW - Can enroll in patient assistance for Entresto + [name] - will complete during TOC follow up appt if pt is agreeable to coming. If unable to schedule TOC follow up, will plan to submit paperwork prior to discharge.   3)  Education  - To be completed prior to discharge  Sharen Hones, PharmD, BCPS Heart Failure Stewardship Pharmacist Phone [phone]

## 2021-01-27 NOTE — Progress Notes (Signed)
OT Cancellation Note  Patient Details Name: Todd Vega MRN: [id number] DOB: 02/07/1961   Cancelled Treatment:    Reason Eval/Treat Not Completed: Patient at procedure or test/ unavailable;Other (comment) will check back as time allows for OT session.  Lenor Derrick., COTA/L Acute Rehabilitation Services [phone]   Barron Schmid 01/27/2021, 11:15 AM

## 2021-01-27 NOTE — Progress Notes (Signed)
Progress Note  Patient Name: Todd Vega Date of Encounter: 01/27/2021  [facility] Cardiologist: [name], MD   Subjective   No chest pain.   Inpatient Medications    Scheduled Meds: . [START ON 01/28/2021] aspirin EC  81 mg Oral Daily  . atorvastatin  20 mg Oral Daily  . carvedilol  6.25 mg Oral BID WC  . clopidogrel  75 mg Oral Daily  . folic acid  1 mg Oral Daily  . multivitamin with minerals  1 tablet Oral Daily  . sacubitril-valsartan  1 tablet Oral BID  . sodium chloride flush  3 mL Intravenous Q12H  . sodium chloride flush  3 mL Intravenous Q12H  . thiamine  100 mg Oral Daily   Continuous Infusions: . sodium chloride 10 mL/hr at 01/24/21 1029  . sodium chloride    . sodium chloride 1 mL/kg/hr (01/27/21 0532)   PRN Meds: sodium chloride, sodium chloride, acetaminophen **OR** acetaminophen (TYLENOL) oral liquid 160 mg/5 mL **OR** acetaminophen, sodium chloride flush, sodium chloride flush   Vital Signs    Vitals:   01/27/21 0544 01/27/21 0735 01/27/21 0829 01/27/21 0854  BP: 121/75 99/74  106/87  Pulse: 80 62    Resp:  20    Temp:  97.8 F (36.6 C)    TempSrc:  Oral    SpO2: 100% 99%    Weight:   56.2 kg   Height:  (1.651 m)       Intake/Output Summary (Last 24 hours) at 01/27/2021 0938 Last data filed at 01/27/2021 0647 Gross per 24 hour  Intake 244.62 ml  Output --  Net 244.62 ml   Last 3 Weights 01/27/2021 01/23/2021  Weight (lbs) 124 lb 133 lb 2.5 oz  Weight (kg) 56.246 kg 60.4 kg      Telemetry    sinus- Personally Reviewed  ECG    No new tracing   Physical Exam   General: Well developed, well nourished, NAD  HEENT: OP clear, mucus membranes moist  SKIN: warm, dry. No rashes. Neuro: No focal deficits. Expressive aphasia Musculoskeletal: Muscle strength 5/5 all ext  Psychiatric: Mood and affect normal  Neck: No JVD, no carotid bruits, no thyromegaly, no lymphadenopathy.  Lungs:Clear bilaterally, no wheezes, rhonci,  crackles Cardiovascular: Regular rate and rhythm. No murmurs, gallops or rubs. Abdomen:Soft. Bowel sounds present. Non-tender.  Extremities: No lower extremity edema.  Labs    High Sensitivity Troponin:  No results for input(s): TROPONINIHS in the last 720 hours.    Chemistry Recent Labs  Lab 01/23/21 0531 01/23/21 0535 01/24/21 1105 01/26/21 0427 01/27/21 0600  NA 133*   < > 133* 133* 132*  K 4.1   < > 3.9 4.0 3.8  CL 100   < > 100 100 101  CO2 20*  --  23 21* 23  GLUCOSE 116*   < > 112* 103* 91  BUN 14   < > CREATININE 1.09   < > 1.20 1.28* 1.30*  CALCIUM 8.8*  --  9.1 8.8* 8.6*  PROT 7.7  --   --   --   --   ALBUMIN 3.6  --   --   --   --   AST 34  --   --   --   --   ALT 18  --   --   --   --   ALKPHOS 71  --   --   --   --  BILITOT 0.3  --   --   --   --   GFRNONAA >60  --  >60 >60 >60  ANIONGAP 13  --  10 12 8    < > = values in this interval not displayed.     Hematology Recent Labs  Lab 01/23/21 0531 01/23/21 0535 01/26/21 1931  WBC 3.3*  --  3.6*  RBC 5.61  --  5.58  HGB 14.5 17.0 14.5  HCT 44.3 50.0 44.2  MCV 79.0*  --  79.2*  MCH 25.8*  --  26.0  MCHC 32.7  --  32.8  RDW 14.8  --  14.1  PLT 154  --  165    BNPNo results for input(s): BNP, PROBNP in the last 168 hours.   DDimer No results for input(s): DDIMER in the last 168 hours.   Radiology    VAS 03/28/21 TRANSCRANIAL DOPPLER W BUBBLES  Result Date: 01/26/2021  Transcranial Doppler with Bubble Indications: Stroke. History: Small acute cortical infarct in the left frontal region. Comparison Study: No prior study on file Performing Technologist: 03/28/2021 RVS Supporting Technologist: Sherren Kerns RDMS,RVT  Examination Guidelines: A complete evaluation includes B-mode imaging, spectral Doppler, color Doppler, and power Doppler as needed of all accessible portions of each vessel. Bilateral testing is considered an integral part of a complete examination. Limited examinations for  reoccurring indications may be performed as noted.  Summary: No HITS at rest or during Valsalva. Negative transcranial Doppler Bubble study with no evidence of right to left intracardiac communication.  NEGATIVE TCD Bubble study *See table(s) above for TCD measurements and observations.  Diagnosing physician: Jean Rosenthal MD Electronically signed by Delia Heady MD on 01/26/2021 at 12:57:14 PM.    Final    ECHO TEE  Result Date: 01/25/2021    TRANSESOPHOGEAL ECHO REPORT   Patient Name:   03/27/2021 Date of Exam: 01/25/2021 Medical Rec #:  03/27/2021     Height: Accession #:    [id number]    Weight:       133.2 lb Date of Birth:  05-30-1961      BSA:          1.540 m Patient Age:    60 years      BP:           116/77 mmHg Patient Gender: M             HR:           100 bpm. Exam Location:  Inpatient Procedure: Transesophageal Echo, Cardiac Doppler, Color Doppler and Saline            Contrast Bubble Study Indications:     CVA  History:         Patient has prior history of Echocardiogram examinations, most                  recent 01/23/2021. Cardiomyopathy, Stroke; Risk                  Factors:Hypertension and Dyslipidemia.  Sonographer:     03/25/2021 Referring Phys:  Lavenia Atlas [id number] Diagnosing Phys: Kathleene Hazel MD PROCEDURE: After discussion of the risks and benefits of a TEE, an informed consent was obtained from the patient. The transesophogeal probe was passed without difficulty through the esophogus of the patient. Sedation performed by different physician. The patient was monitored while under deep sedation. Anesthestetic sedation was provided intravenously by Anesthesiology: 209mg  of Propofol, 60mg  of Lidocaine. Image  quality was good. The patient's vital signs; including heart rate, blood pressure, and oxygen saturation; remained stable throughout the procedure. The patient developed no complications during the procedure. IMPRESSIONS  1. Left ventricular ejection fraction, by  estimation, is 20 to 25%. The left ventricle has severely decreased function. The left ventricle demonstrates global hypokinesis. The left ventricular internal cavity size was severely dilated.  2. Right ventricular systolic function is normal. The right ventricular size is normal.  3. Left atrial size was moderately dilated. No left atrial/left atrial appendage thrombus was detected.  4. Right atrial size was mildly dilated.  5. The mitral valve is normal in structure. Mild mitral valve regurgitation.  6. The aortic valve is tricuspid. Aortic valve regurgitation is not visualized. No aortic stenosis is present.  7. Agitated saline contrast bubble study was negative, with no evidence of any interatrial shunt. Conclusion(s)/Recommendation(s): No LA/LAA thrombus identified. Negative bubble study for interatrial shunt. No intracardiac source of embolism detected on this on this transesophageal echocardiogram. Severely reduced LVEF. FINDINGS  Left Ventricle: Left ventricular ejection fraction, by estimation, is 20 to 25%. The left ventricle has severely decreased function. The left ventricle demonstrates global hypokinesis. The left ventricular internal cavity size was severely dilated. Right Ventricle: The right ventricular size is normal. No increase in right ventricular wall thickness. Right ventricular systolic function is normal. Left Atrium: Left atrial size was moderately dilated. No left atrial/left atrial appendage thrombus was detected. Right Atrium: Right atrial size was mildly dilated. Pericardium: There is no evidence of pericardial effusion. Mitral Valve: The mitral valve is normal in structure. Mild mitral valve regurgitation. There is no evidence of mitral valve vegetation. Tricuspid Valve: The tricuspid valve is normal in structure. Tricuspid valve regurgitation is trivial. No evidence of tricuspid stenosis. There is no evidence of tricuspid valve vegetation. Aortic Valve: The aortic valve is tricuspid.  Aortic valve regurgitation is not visualized. No aortic stenosis is present. Aortic valve peak gradient measures 2.5 mmHg. There is no evidence of aortic valve vegetation. Pulmonic Valve: The pulmonic valve was grossly normal. Pulmonic valve regurgitation is trivial. There is no evidence of pulmonic valve vegetation. Aorta: The aortic root and ascending aorta are structurally normal, with no evidence of dilitation. IAS/Shunts: The interatrial septum appears to be lipomatous. No atrial level shunt detected by color flow Doppler. Agitated saline contrast was given intravenously to evaluate for intracardiac shunting. Agitated saline contrast bubble study was negative,  with no evidence of any interatrial shunt.  AORTIC VALVE AV Vmax:      78.65 cm/s AV Peak Grad: 2.5 mmHg Jodelle Red MD Electronically signed by Jodelle Red MD Signature Date/Time: 01/25/2021/2:11:52 PM    Final    ECHOCARDIOGRAM LIMITED  Result Date: 01/26/2021    ECHOCARDIOGRAM LIMITED REPORT   Patient Name:   Taryll Jean Rosenthal Date of Exam: 01/26/2021 Medical Rec #:  [id number]     Height: Accession #:    [id number]    Weight:       133.2 lb Date of Birth:  [birth year]      BSA:          1.540 m Patient Age:    60 years      BP:           142/93 mmHg Patient Gender: M             HR:           78 bpm. Exam Location:  Inpatient Procedure: Limited Echo and Intracardiac Opacification Agent  Indications:    Rule out LV thrombus; I50.40* Unspecified combined systolic                 (congestive) and diastolic (congestive) heart failure  History:        Patient has prior history of Echocardiogram examinations, most                 recent 01/25/2021. CHF; Stroke. ETOH.  Sonographer:    Sheralyn Boatman RDCS Referring Phys: [id number] Aneudy Champlain D Ritesh Opara IMPRESSIONS  1. Left ventricular ejection fraction, by estimation, is <20%. The left ventricle has severely decreased function. The left ventricle demonstrates regional wall motion abnormalities (see  scoring diagram/findings for description). The left ventricular internal cavity size was severely dilated. There is severe akinesis of the left ventricular, apical apical segment.  2. Right ventricular systolic function is normal. The right ventricular size is normal.  3. The aortic valve is grossly normal. Aortic valve regurgitation is not visualized. No aortic stenosis is present. FINDINGS  Left Ventricle: Left ventricular ejection fraction, by estimation, is <20%. The left ventricle has severely decreased function. The left ventricle demonstrates regional wall motion abnormalities. Severe akinesis of the left ventricular, apical apical segment. Definity contrast agent was given IV to delineate the left ventricular endocardial borders. The left ventricular internal cavity size was severely dilated. Right Ventricle: The right ventricular size is normal. Right vetricular wall thickness was not well visualized. Right ventricular systolic function is normal. Aortic Valve: The aortic valve is grossly normal. Aortic valve regurgitation is not visualized. No aortic stenosis is present. LEFT VENTRICLE PLAX 2D LVIDd:         6.20 cm LVIDs:         6.00 cm LV PW:         1.90 cm LV IVS:        1.50 cm  LV Volumes (MOD) LV vol d, MOD A2C: 160.0 ml LV vol d, MOD A4C: 232.0 ml LV vol s, MOD A2C: 149.0 ml LV vol s, MOD A4C: 139.0 ml LV SV MOD A2C:     11.0 ml LV SV MOD A4C:     232.0 ml LV SV MOD BP:      57.7 ml LEFT ATRIUM         Index LA diam:    2.40 cm 1.56 cm/m   AORTA Ao Root diam: 3.80 cm [name] MD Electronically signed by [name] MD Signature Date/Time: 01/26/2021/4:40:52 PM    Final     Cardiac Studies   Echo: 01/23/21  IMPRESSIONS    1. Left ventricular ejection fraction, by estimation, is 25 to 30%. The  left ventricle has severely decreased function. The left ventricle  demonstrates global hypokinesis. The left ventricular internal cavity size  was severely dilated. There is mild   asymmetric left ventricular hypertrophy of the basal and septal segments.  Left ventricular diastolic parameters are consistent with Grade I  diastolic dysfunction (impaired relaxation).  2. Right ventricular systolic function is normal. The right ventricular  size is normal.  3. Left atrial size was moderately dilated.  4. The mitral valve is normal in structure. Mild mitral valve  regurgitation. No evidence of mitral stenosis.  5. The aortic valve is tricuspid. There is mild calcification of the  aortic valve. Aortic valve regurgitation is not visualized. Mild aortic  valve sclerosis is present, with no evidence of aortic valve stenosis.  6. Aortic dilatation noted. There is mild dilatation of the aortic root,  measuring 40 mm.  7. The inferior vena cava is normal in size with greater than 50%  respiratory variability, suggesting right atrial pressure of 3 mmHg.   TEE 01/25/21:  1. Left ventricular ejection fraction, by estimation, is 20 to 25%. The  left ventricle has severely decreased function. The left ventricle  demonstrates global hypokinesis. The left ventricular internal cavity size  was severely dilated.  2. Right ventricular systolic function is normal. The right ventricular  size is normal.  3. Left atrial size was moderately dilated. No left atrial/left atrial  appendage thrombus was detected.  4. Right atrial size was mildly dilated.  5. The mitral valve is normal in structure. Mild mitral valve  regurgitation.  6. The aortic valve is tricuspid. Aortic valve regurgitation is not  visualized. No aortic stenosis is present.  7. Agitated saline contrast bubble study was negative, with no evidence  of any interatrial shunt.   Conclusion(s)/Recommendation(s): No LA/LAA thrombus identified. Negative  bubble study for interatrial shunt. No intracardiac source of embolism  detected on this on this transesophageal echocardiogram. Severely reduced  LVEF.    Patient Profile     60 y.o. male with no significant PMH who was seen for the evaluation of new cardiomyopathy following an acute CVA.   Assessment & Plan    1. New cardiomyopathy: Pt presented with acute CVA with aphasia and right sided facial droop with MRI imaging showing left frontal MCA branch infarct>>CT angiogram showed no large vessel stenosis or occlusion. Transthoracic echo with LVEF=25-30%, LVH. TEE with LVEF=20-25% with global hypokinesis, mild MR. No evidence of intracardiac shunting. No LV or LAA thrombus noted.  I have personally reviewed the echo from yesterday with Definity contrast and there appears to be smoke in the LV apex with possible early thrombus formation. He will need anti-coagulation post cath. I would favor the use of Eliquis to aid in compliance.  -Right and left heart cath today to exclude CAD -Continue Entresto.  -Add spironolactone before discharge  -Continue Coreg   2. Left MCA CVA: Pt had acute onset of aphasia with right facial droop and a code stroke was called. MRI with left MCA stroke. CTA with no significant atherosclerosis. I think his stroke is most likely due to LV thrombus. Limited echo yesterday with smoke in the LV apex. See above. I would anticipate stopping Plavix tomorrow and continuing ASA after Eliquis is started.    3. HTN: BP tolerating addition of Entresto.  4. HLD: LDL, 85 with LDL goal <70 given CVA.  -Continue statin  For questions or updates, please contact [facility] Please consult [email or web address] for contact info under        Signed, Verne Carrowhristopher Amariah Kierstead, MD  01/27/2021, 9:38 AM

## 2021-01-27 NOTE — Progress Notes (Addendum)
Cardiology PM Note:  Pt admitted with a likely embolic stroke. It appears by his limited echo yesterday with Definity that he has heavy smoke in his LV apex and early thrombus formation there. This was found on study review today. It is likely that his stroke was embolic from a LV thrombus. He will be started on IV heparin post cath and will require long term anti-coagulation.  He has severe LV systolic dysfunction with finding of severe multi-vessel CAD by cath today. I have reviewed the cath films with Dr. Katrinka Blazing. I agree that PCI of the ostial/proximal LAD would potentially compromise flow into the Circumflex and Ramus intermediate branches. There is at least moderate distal left main disease/left main equivalent. I think CABG is a reasonable option with good distal targets however timing and candidacy will be based on CT surgery recommendations with recent stroke. He has been on Plavix this week per recs of Neurology post stroke. Plavix will now be stopped to allow washout before potential CABG. We can consider a viability study. Filling pressures normal on cath.   Todd Vega 01/27/2021 2:20 PM

## 2021-01-27 NOTE — Progress Notes (Signed)
ANTICOAGULATION CONSULT NOTE - Initial Consult  Pharmacy Consult for heparin  Indication: LV thrombus  No Known Allergies  Patient Measurements: Height: 5\' 5"  (165.1 cm) Weight: 56.3 kg (124 lb 3.2 oz) IBW/kg (Calculated) : 61.5   Vital Signs: Temp: 97.9 F (36.6 C) (04/13 1137) Temp Source: Oral (04/13 1137) BP: 119/79 (04/13 1137) Pulse Rate: 74 (04/13 1137)  Labs: Recent Labs    01/26/21 0427 01/26/21 1931 01/27/21 0600 01/27/21 1056  HGB  --  14.5  --  15.3  HCT  --  44.2  --  45.0  PLT  --  165  --   --   CREATININE 1.28*  --  1.30*  --     Estimated Creatinine Clearance: 48.1 mL/min (A) (by C-G formula based on SCr of 1.3 mg/dL (H)).   Medical History: Past Medical History:  Diagnosis Date  . Asthma due to seasonal allergies     Medications:  Medications Prior to Admission  Medication Sig Dispense Refill Last Dose  . fexofenadine (ALLEGRA) 180 MG tablet Take 180 mg by mouth as needed for allergies or rhinitis.   Past Week at Unknown time   Scheduled:  . [START ON 01/28/2021] aspirin EC  81 mg Oral Daily  . atorvastatin  20 mg Oral Daily  . carvedilol  6.25 mg Oral BID WC  . folic acid  1 mg Oral Daily  . multivitamin with minerals  1 tablet Oral Daily  . sacubitril-valsartan  1 tablet Oral BID  . sodium chloride flush  3 mL Intravenous Q12H  . thiamine  100 mg Oral Daily    Assessment: 4 you male with CVA and LV thrombus s/p cath for CABG consult. Pharmacy consulted to start heparin 6 hours post sheath (removed ~ 11:10am) -hg= 15.3, plt= 165  Goal of Therapy:  Heparin level 0.3-0.5 units/ml Monitor platelets by anticoagulation protocol: Yes   Plan:  -start heparin 750 units/hr at 5:30pm -Daily HL/CBC  67, PharmD Clinical Pharmacist **Pharmacist phone directory can now be found on [email or web address] ([email or web address]).  Listed under [facility].

## 2021-01-27 NOTE — CV Procedure (Signed)
   Coronary angio only and right heart cath.  Severe coronary disease that involves left main, ostial LAD, first diagonal, ramus, and circumflex.  Also has severe proximal circumflex, and moderate mid RCA.  Low filling pressures.  No pulmonary hypertension.  Mean capillary wedge less than 6 mmHg and mean pulmonary artery pressure 9 mmHg.  Need to determine if he is a revascularization candidate.  Perhaps a viability study would be of use.

## 2021-01-27 NOTE — Progress Notes (Signed)
[facility] PROGRESS NOTE    Todd Vega  OEU:[id number] DOB: 13-Nov-1960 DOA: 01/23/2021 PCP: Pcp, No      Brief Narrative:  [name] is a 60 y.o. M with allergies who presented with acute onset aphasia and facial droop.  In the ER, CT head showed acute stroke.  He was hypertensive.  He was outside the tPA window.  Admitted for stroke work up.     Assessment & Plan:  Acute stroke MRI confirmed stroke.  Non-invasive angiography showed mild atherosclerotic change.  Carotids normal.   Echo showed likely source of embolism with early thrombus.  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   Difficult to place patient No       Level of care: Progressive Cardiac       MDM: The below labs and imaging reports were reviewed and summarized above.  Medication management as above.     DVT prophylaxis: SCD's Start: 01/27/21 1137 Place and maintain sequential compression device Start: 01/24/21 1531 SCD's Start: 01/23/21 0738  Code Status: FULL Family Communication: Niece          Subjective: Patient has no chest pain, orthopnea, swelling.  No confusion, change in his aphasia.  No seizures, passing  out.  Objective: Vitals:   01/27/21 0854 01/27/21 1137 01/27/21 1239 01/27/21 1733  BP: 106/87 119/79 (!) 148/105 (!) 145/109  Pulse:  74  86  Resp:  18    Temp:  97.9 F (36.6 C)    TempSrc:  Oral    SpO2:  100%    Weight:  56.3 kg    Height:  5\' 5"  (1.651 m)      Intake/Output Summary (Last 24 hours) at 01/27/2021 1756 Last data filed at 01/27/2021 1748 Gross per 24 hour  Intake 1774.45 ml  Output --  Net 1774.45 ml   Filed Weights   01/23/21 0556 01/27/21 0829 01/27/21 1137  Weight: 60.4 kg 56.2 kg 56.3 kg    Examination: General appearance:  adult male, alert and in no acute distress.   HEENT: Anicteric, conjunctiva pink, lids and lashes normal. No nasal deformity, discharge, epistaxis.  Lips moist.   Skin: Warm and dry.  no jaundice.  No suspicious rashes or lesions. Cardiac: RRR, nl S1-S2, I do not appreciate a murmur.  Capillary refill is brisk.  JVP normal.  No LE edema.  Radial pulses 2+ and symmetric. Respiratory: Normal respiratory rate and rhythm.  CTAB without rales or wheezes. Abdomen: Abdomen soft.  No TTP or guarding. No ascites, distension, hepatosplenomegaly.   MSK: No deformities or effusions. Neuro: Awake and alert.  EOMI. Speech fairly densely aphasic.    Psych: Sensorium intact and responding to questions, attention normal. Affect .  Judgment and insight appear normalnormal.    Data Reviewed: I have personally reviewed following labs and imaging studies:  CBC: Recent Labs  Lab 01/23/21 0531 01/23/21 0535 01/26/21 1931 01/27/21 1056  WBC 3.3*  --  3.6*  --   NEUTROABS 2.1  --   --   --   HGB 14.5 17.0 14.5 15.3  HCT 44.3 50.0 44.2 45.0  MCV 79.0*  --  79.2*  --   PLT 154  --  165  --    Basic Metabolic Panel: Recent Labs  Lab 01/23/21 0531 01/23/21 0535 01/23/21 0851 01/24/21 1105 01/26/21 0427 01/27/21 0600 01/27/21 1056  NA 133* 135  --  133* 133* 132* 135  K 4.1 4.1  --  3.9 4.0 3.8 3.8  CL 100 103  --  100 100 101  --   CO2  20*  --   --  23 21* 23  --   GLUCOSE 116* 112*  --  112* 103* 91  --   BUN 14 16  --  --   CREATININE 1.09 1.00  --  1.20 1.28* 1.30*  --   CALCIUM 8.8*  --   --  9.1 8.8* 8.6*  --   MG  --   --  1.5*  --  2.0  --   --   PHOS  --   --  3.2  --   --   --   --    GFR: Estimated Creatinine Clearance: 48.1 mL/min (A) (by C-G formula based on SCr of 1.3 mg/dL (H)). Liver Function Tests: Recent Labs  Lab 01/23/21 0531  AST 34  ALT 18  ALKPHOS 71  BILITOT 0.3  PROT 7.7  ALBUMIN 3.6   No results for input(s): LIPASE, AMYLASE in the last 168 hours. No results for input(s): AMMONIA in the last 168 hours. Coagulation Profile: Recent Labs  Lab 01/23/21 0531  INR 1.1   Cardiac Enzymes: No results for input(s): CKTOTAL, CKMB, CKMBINDEX, TROPONINI in the last 168 hours. BNP (last 3 results) No results for input(s): PROBNP in the last 8760 hours. HbA1C: No results for input(s): HGBA1C in the last 72 hours. CBG: Recent Labs  Lab 01/23/21 0530  GLUCAP 117*   Lipid Profile: No results for input(s): CHOL, HDL, LDLCALC, TRIG, CHOLHDL, LDLDIRECT in the last 72 hours. Thyroid Function Tests: No results for input(s): TSH, T4TOTAL, FREET4, T3FREE, THYROIDAB in the last 72 hours. Anemia Panel: No results for input(s): VITAMINB12, FOLATE, FERRITIN, TIBC, IRON, RETICCTPCT in the last 72 hours. Urine analysis:    Component Value Date/Time   COLORURINE STRAW (A) 01/23/2021 0700   APPEARANCEUR CLEAR 01/23/2021 0700   LABSPEC 1.025 01/23/2021 0700   PHURINE 6.0 01/23/2021 0700   GLUCOSEU NEGATIVE 01/23/2021 0700   HGBUR MODERATE (A) 01/23/2021 0700   BILIRUBINUR NEGATIVE 01/23/2021 0700   KETONESUR NEGATIVE 01/23/2021 0700   PROTEINUR NEGATIVE 01/23/2021 0700   NITRITE NEGATIVE 01/23/2021 0700   LEUKOCYTESUR NEGATIVE 01/23/2021 0700   Sepsis Labs: (procalcitonin:4,lacticacidven:4)  ) Recent Results (from the past 240 hour(s))  Resp Panel by RT-PCR (Flu A&B,  Covid) Nasopharyngeal Swab     Status: None   Collection Time: 01/23/21  5:31 AM   Specimen: Nasopharyngeal Swab; Nasopharyngeal(NP) swabs in vial transport medium  Result Value Ref Range Status   SARS Coronavirus 2 by RT PCR NEGATIVE NEGATIVE Final    Comment: (NOTE) SARS-CoV-2 target nucleic acids are NOT DETECTED.  The SARS-CoV-2 RNA is  generally detectable in upper respiratory specimens during the acute phase of infection. The lowest concentration of SARS-CoV-2 viral copies this assay can detect is 138 copies/mL. A negative result does not preclude SARS-Cov-2 infection and should not be used as the sole basis for treatment or other patient management decisions. A negative result may occur with  improper specimen collection/handling, submission of specimen other than nasopharyngeal swab, presence of viral mutation(s) within the areas targeted by this assay, and inadequate number of viral copies(<138 copies/mL). A negative result must be combined with clinical observations, patient history, and epidemiological information. The expected result is Negative.  Fact Sheet for Patients:  BloggerCourse.com  Fact Sheet for Healthcare Providers:  SeriousBroker.it  This test is no t yet approved or cleared by the [place] FDA and  has been authorized for detection and/or diagnosis of SARS-CoV-2 by FDA under an Emergency Use Authorization (EUA). This EUA will remain  in effect (meaning this test can be used) for the duration of the COVID-19 declaration under Section 564(b)(1) of the Act, 21 U.S.C.section 360bbb-3(b)(1), unless the authorization is terminated  or revoked sooner.       Influenza A by PCR NEGATIVE NEGATIVE Final   Influenza B by PCR NEGATIVE NEGATIVE Final    Comment: (NOTE) The Xpert Xpress SARS-CoV-2/FLU/RSV plus assay is intended as an aid in the diagnosis of influenza from Nasopharyngeal swab specimens and should  not be used as a sole basis for treatment. Nasal washings and aspirates are unacceptable for Xpert Xpress SARS-CoV-2/FLU/RSV testing.  Fact Sheet for Patients: BloggerCourse.com  Fact Sheet for Healthcare Providers: SeriousBroker.it  This test is not yet approved or cleared by the [place] FDA and has been authorized for detection and/or diagnosis of SARS-CoV-2 by FDA under an Emergency Use Authorization (EUA). This EUA will remain in effect (meaning this test can be used) for the duration of the COVID-19 declaration under Section 564(b)(1) of the Act, 21 U.S.C. section 360bbb-3(b)(1), unless the authorization is terminated or revoked.  Performed at [facility], [address] [place], [place] [address]          Radiology Studies: CARDIAC CATHETERIZATION  Result Date: 01/27/2021  Bulky calcified ulcerated distal left main 50%.  Ostial LAD 85%, calcified.  First diagonal is large and contains 70 to 80% ostial narrowing and 90% stenosis of the smaller branch.  Ostial ramus 70 to 80%, calcified.  Ostial circumflex 50 to 70%, and calcified.  Proximal to mid circumflex 90%.  RCA contains eccentric bulky 30 to 40% stenosis.  Right heart pressures were normal.  Mean capillary wedge pressure 5 mmHg.  Mean pulmonary artery pressure 9 mmHg. RECOMMENDATIONS:  Consider revascularization options.  Consider viability study.  Coronary anatomy is surgical.  Would be difficult to perform PCI and not have ischemic complications.  ECHOCARDIOGRAM LIMITED  Result Date: 01/26/2021    ECHOCARDIOGRAM LIMITED REPORT   Patient Name:   Todd Vega Date of Exam: 01/26/2021 Medical Rec #:  [id number]     Height: Accession #:    [id number]    Weight:       133.2 lb Date of Birth:  [birth year]      BSA:          1.540 m Patient Age:    60 years      BP:           142/93 mmHg Patient Gender: M             HR:  78 bpm. Exam Location:   Inpatient Procedure: Limited Echo and Intracardiac Opacification Agent Indications:    Rule out LV thrombus; I50.40* Unspecified combined systolic                 (congestive) and diastolic (congestive) heart failure  History:        Patient has prior history of Echocardiogram examinations, most                 recent 01/25/2021. CHF; Stroke. ETOH.  Sonographer:    Sheralyn Boatmanina West RDCS Referring Phys: [id number] Jhanae Jaskowiak D MCALHANY IMPRESSIONS  1. Left ventricular ejection fraction, by estimation, is <20%. The left ventricle has severely decreased function. The left ventricle demonstrates regional wall motion abnormalities (see scoring diagram/findings for description). The left ventricular internal cavity size was severely dilated. There is severe akinesis of the left ventricular, apical apical segment.  2. Right ventricular systolic function is normal. The right ventricular size is normal.  3. The aortic valve is grossly normal. Aortic valve regurgitation is not visualized. No aortic stenosis is present. FINDINGS  Left Ventricle: Left ventricular ejection fraction, by estimation, is <20%. The left ventricle has severely decreased function. The left ventricle demonstrates regional wall motion abnormalities. Severe akinesis of the left ventricular, apical apical segment. Definity contrast agent was given IV to delineate the left ventricular endocardial borders. The left ventricular internal cavity size was severely dilated. Right Ventricle: The right ventricular size is normal. Right vetricular wall thickness was not well visualized. Right ventricular systolic function is normal. Aortic Valve: The aortic valve is grossly normal. Aortic valve regurgitation is not visualized. No aortic stenosis is present. LEFT VENTRICLE PLAX 2D LVIDd:         6.20 cm LVIDs:         6.00 cm LV PW:         1.90 cm LV IVS:        1.50 cm  LV Volumes (MOD) LV vol d, MOD A2C: 160.0 ml LV vol d, MOD A4C: 232.0 ml LV vol s, MOD A2C: 149.0 ml LV vol  s, MOD A4C: 139.0 ml LV SV MOD A2C:     11.0 ml LV SV MOD A4C:     232.0 ml LV SV MOD BP:      57.7 ml LEFT ATRIUM         Index LA diam:    2.40 cm 1.56 cm/m   AORTA Ao Root diam: 3.80 cm Kristeen MissPhilip Nahser MD Electronically signed by Kristeen MissPhilip Nahser MD Signature Date/Time: 01/26/2021/4:40:52 PM    Final         Scheduled Meds: . Melene Muller[START ON 01/28/2021] aspirin EC  81 mg Oral Daily  . atorvastatin  20 mg Oral Daily  . carvedilol  6.25 mg Oral BID WC  . folic acid  1 mg Oral Daily  . loratadine  10 mg Oral Daily  . multivitamin with minerals  1 tablet Oral Daily  . sacubitril-valsartan  1 tablet Oral BID  . sodium chloride flush  3 mL Intravenous Q12H  . thiamine  100 mg Oral Daily   Continuous Infusions: . sodium chloride 10 mL/hr at 01/24/21 1029  . sodium chloride    . heparin 750 Units/hr (01/27/21 1738)     LOS: 4 days    Time spent: 25 minutes    Alberteen Samhristopher P Daylee Delahoz, MD [facility] 01/27/2021, 5:56 PM     Please page though AMION or Epic secure chat:  For [employer] password, contact  charge nurse

## 2021-01-27 NOTE — Progress Notes (Signed)
TCTS consulted for CABG evaluation. °

## 2021-01-27 NOTE — Progress Notes (Signed)
  Speech Language Pathology Treatment: Dysphagia;Cognitive-Linquistic  Patient Details Name: Todd Vega MRN: [id number] DOB: 02-20-1961 Today's Date: 01/27/2021 Time: 4970-2637 SLP Time Calculation (min) (ACUTE ONLY): 20 min  Assessment / Plan / Recommendation Clinical Impression  Note that pt's diet had been advanced to regular solids, but pt reports that he liked the mechanical soft textures. During skilled observation of PO intake, pt had mild-moderate R buccal pocketing that he cleared well with Min cues for a lingual sweep and liquid wash. Recommend adjusting his diet back to Dys 3 solids and thin liquids. From a communication standpoint, pt was writing fluently in sentences without errors. He participated in a verbal sequencing task with seemingly improved verbal output that seems to be more impacted by speech errors and sound distortions. With Mod faded to Min cues to pause or slow his rate, he can often produce more intelligible communication at the phrase level (with a few sentences). He is very motivated and would benefit from ongoing SLP services to maximize communication and swallowing.    HPI HPI: Todd Vega is a 60 y.o. male with no significant  past medical history comes in after waking up in the middle night and waking up his wife for aphasia and right-sided facial droop. MRI brain reveals smaller areas of altered diffusion at the border zone of the left MCA territory as well as small remote left cerebellar infarct. PMH includes no medical care in 5+ years, questionable alcohol abuse per sister who he lives with, hypertension.      SLP Plan  Continue with current plan of care       Recommendations  Diet recommendations: Dysphagia 3 (mechanical soft);Thin liquid Liquids provided via: Cup;Straw Medication Administration: Whole meds with liquid Supervision: Patient able to self feed;Intermittent supervision to cue for compensatory strategies Compensations: Slow rate;Small  sips/bites;Lingual sweep for clearance of pocketing Postural Changes and/or Swallow Maneuvers: Seated upright 90 degrees                Oral Care Recommendations: Oral care BID Follow up Recommendations: Outpatient SLP;24 hour supervision/assistance SLP Visit Diagnosis: Dysphagia, oral phase (R13.11) Plan: Continue with current plan of care       GO                Mahala Menghini., M.A. CCC-SLP Acute Rehabilitation Services Pager [phone] Office [phone]  01/27/2021, 5:38 PM

## 2021-01-27 NOTE — Consult Note (Addendum)
[address]       [place] [address]             [phone]        Ryelan Kazee [facility] Medical Record #[id number] Date of Birth: 06/21/1961  ReferringSmith/McAlhany Primary Care: Pcp, No Primary Cardiologist:Philip Nahser, MD  Chief Complaint:    Chief Complaint  Patient presents with  . Code Stroke   History of Present Illness:      Todd Vega is a 60 yo AA male with no previous medical history.  He presented to the ED on 01/23/2021 with complaints of difficulty speak and right sided facial droop.  CT scan obtained in the ED showed evidence of a Left MCA territory stroke felt to be likely embolic in nature.  Neurology consult was obtained and they initiated ASA and anti-platelet therapy.  He was not felt to be a candidate for TPA.  Echocardiogram was obtained and showed the patient to have an EF of 20-25% and evidence of a thrombus in his LV.  He was initiated on Entresto for low EF and Hypertension.  He underwent cardiac catheterization which showed multivessel CAD.  It was felt coronary bypass grafting would be indicated and TCTS consult was obtained.  The patient currently denies chest pain and shortness of breath.  He remains aphasic, but does attempt to communicate and answer questions.  He denies previous history of medical problems.  He denies use of nicotine.  He does admit to drinking multiple times per week.  He states he doesn't want surgery, but is agreeable to hear about the surgery and think about it.  Current Activity/ Functional Status: Patient is independent with mobility/ambulation, transfers, ADL's, IADL's.   Zubrod Score: At the time of surgery this patient's most appropriate activity status/level should be described as:     0    Normal activity, no symptoms     1    Restricted in physical strenuous activity but ambulatory, able to do out light work     2    Ambulatory and capable of self care, unable to do work activities, up and  about                 more than 50%  Of the time                                3    Only limited self care, in bed greater than 50% of waking hours     4    Completely disabled, no self care, confined to bed or chair     5    Moribund  Past Medical History:  Diagnosis Date  . Asthma due to seasonal allergies     Past Surgical History:  Procedure Laterality Date  . BUBBLE STUDY  01/25/2021   Procedure: BUBBLE STUDY;  Surgeon: Jodelle Red, MD;  Location: [facility] ENDOSCOPY;  Service: Cardiovascular;;  . RIGHT HEART CATH AND CORONARY ANGIOGRAPHY N/A 01/27/2021   Procedure: RIGHT HEART CATH AND CORONARY ANGIOGRAPHY;  Surgeon: Lyn Records, MD;  Location: [facility] INVASIVE CV LAB;  Service: Cardiovascular;  Laterality: N/A;  . TEE WITHOUT CARDIOVERSION N/A 01/25/2021   Procedure: TRANSESOPHAGEAL ECHOCARDIOGRAM (TEE);  Surgeon: Jodelle Red, MD;  Location: [facility] ENDOSCOPY;  Service: Cardiovascular;  Laterality: N/A;    Social History   Tobacco Use  Smoking Status Never Smoker  Smokeless Tobacco Never  Used    Social History   Substance and Sexual Activity  Alcohol Use Yes  . Alcohol/week: 6.0 standard drinks  . Types: 6 Cans of beer per week     No Known Allergies  Current Facility-Administered Medications  Medication Dose Route Frequency Provider Last Rate Last Admin  . 0.9 %  sodium chloride infusion  250 mL Intravenous PRN Lyn Records, MD 10 mL/hr at 01/24/21 1029 Restarted at 01/24/21 1029  . 0.9 %  sodium chloride infusion   Intravenous Continuous Lyn Records, MD      . 0.9 %  sodium chloride infusion  250 mL Intravenous PRN Lyn Records, MD      . acetaminophen (TYLENOL) tablet 650 mg  650 mg Oral Q4H PRN Lyn Records, MD      . Melene Muller ON 01/28/2021] aspirin EC tablet 81 mg  81 mg Oral Daily Lyn Records, MD      . atorvastatin (LIPITOR) tablet 20 mg  20 mg Oral Daily Lyn Records, MD   20 mg at 01/27/21 1240  . carvedilol (COREG) tablet 6.25 mg   6.25 mg Oral BID WC Lyn Records, MD   6.25 mg at 01/27/21 0542  . folic acid (FOLVITE) tablet 1 mg  1 mg Oral Daily Lyn Records, MD   1 mg at 01/27/21 1240  . heparin ADULT infusion 100 units/mL (25000 units/273mL)  750 Units/hr Intravenous Continuous Silvana Newness, RPH      . hydrALAZINE (APRESOLINE) injection 10 mg  10 mg Intravenous Q20 Min PRN Lyn Records, MD      . labetalol (NORMODYNE) injection 10 mg  10 mg Intravenous Q10 min PRN Lyn Records, MD      . multivitamin with minerals tablet 1 tablet  1 tablet Oral Daily Lyn Records, MD   1 tablet at 01/27/21 1239  . ondansetron (ZOFRAN) injection 4 mg  4 mg Intravenous Q6H PRN Lyn Records, MD      . oxyCODONE (Oxy IR/ROXICODONE) immediate release tablet 5-10 mg  5-10 mg Oral Q4H PRN Lyn Records, MD      . sacubitril-valsartan (ENTRESTO) 24-26 mg per tablet  1 tablet Oral BID Lyn Records, MD   1 tablet at 01/27/21 1239  . sodium chloride flush (NS) 0.9 % injection 3 mL  3 mL Intravenous Q12H Lyn Records, MD   3 mL at 01/26/21 2117  . thiamine tablet 100 mg  100 mg Oral Daily Lyn Records, MD   100 mg at 01/27/21 1240    Medications Prior to Admission  Medication Sig Dispense Refill Last Dose  . fexofenadine (ALLEGRA) 180 MG tablet Take 180 mg by mouth as needed for allergies or rhinitis.   Past Week at Unknown time    History reviewed. No pertinent family history.   Review of Systems:     Cardiac Review of Systems: Y or  [    ]= no  Chest Pain [ N   ]  Resting SOB [ N  ] Exertional SOB  [ N ]  Orthopnea [  ]   Pedal Edema [ N  ]    Palpitations [ N ] Syncope  [  ]   Presyncope [   ]  General Review of Systems: [Y] = yes [  ]=no Constitional: recent weight change [  ]; anorexia [  ]; fatigue [  ]; nausea [  ]; night sweats [  ];  fever [  ]; or chills [  ]                                                               Dental: Last Dentist visit:   Eye : blurred vision [  ]; diplopia [   ]; vision changes [   ];  Amaurosis fugax[  ]; Resp: cough [ N ];  wheezing[  ];  hemoptysis[  ]; shortness of breath[  ]; paroxysmal nocturnal dyspnea[  ]; dyspnea on exertion[ N ]; or orthopnea[  ];  GI:  gallstones[  ], vomiting[  ];  dysphagia[  ]; melena[  ];  hematochezia [  ]; heartburn[  ];   Hx of  Colonoscopy[  ]; GU: kidney stones [  ]; hematuria[  ];   dysuria [  ];  nocturia[  ];  history of     obstruction [  ]; urinary frequency [  ]             Skin: rash, swelling[ N ];, hair loss[  ];  peripheral edema[N  ];  or itching[  ]; Musculosketetal: myalgias[  ];  joint swelling[  ];  joint erythema[  ];  joint pain[  ];  back pain[  ];  Heme/Lymph: bruising[  ];  bleeding[  ];  anemia[  ];  Neuro: TIA[  ];  headaches[  ];  stroke[ Y ];  vertigo[  ];  seizures[  ];   paresthesias[  ];  difficulty walking[  ];aphasia  Psych:depression[  ]; anxiety[  ];  Endocrine: diabetes[ N ];  thyroid dysfunction[ N ];  Physical Exam: BP (!) 148/105   Pulse 74   Temp 97.9 F (36.6 C) (Oral)   Resp 18   Ht  (1.651 m)   Wt 56.3 kg   SpO2 100%   BMI 20.67 kg/m   General appearance: alert, cooperative and no distress Head: Normocephalic, without obvious abnormality, atraumatic Neck: no adenopathy, no carotid bruit, no JVD, supple, symmetrical, trachea midline and thyroid not enlarged, symmetric, no tenderness/mass/nodules Resp: clear to auscultation bilaterally Cardio: regular rate and rhythm GI: soft, non-tender; bowel sounds normal; no masses,  no organomegaly Extremities: extremities normal, atraumatic, no cyanosis or edema Neurologic: Grossly normal, aphasia present  Diagnostic Studies & Laboratory data:     Recent Radiology Findings:   CARDIAC CATHETERIZATION  Result Date: 01/27/2021  Bulky calcified ulcerated distal left main 50%.  Ostial LAD 85%, calcified.  First diagonal is large and contains 70 to 80% ostial narrowing and 90% stenosis of the smaller branch.  Ostial ramus 70 to 80%,  calcified.  Ostial circumflex 50 to 70%, and calcified.  Proximal to mid circumflex 90%.  RCA contains eccentric bulky 30 to 40% stenosis.  Right heart pressures were normal.  Mean capillary wedge pressure 5 mmHg.  Mean pulmonary artery pressure 9 mmHg. RECOMMENDATIONS:  Consider revascularization options.  Consider viability study.  Coronary anatomy is surgical.  Would be difficult to perform PCI and not have ischemic complications.  ECHOCARDIOGRAM LIMITED  Result Date: 01/26/2021    ECHOCARDIOGRAM LIMITED REPORT   Patient Name:   Todd Vega Date of Exam: 01/26/2021 Medical Rec #:  [id number]     Height: Accession #:    [id number]  Weight:       133.2 lb Date of Birth:  May 20, 1961      BSA:          1.540 m Patient Age:    60 years      BP:           142/93 mmHg Patient Gender: M             HR:           78 bpm. Exam Location:  Inpatient Procedure: Limited Echo and Intracardiac Opacification Agent Indications:    Rule out LV thrombus; I50.40* Unspecified combined systolic                 (congestive) and diastolic (congestive) heart failure  History:        Patient has prior history of Echocardiogram examinations, most                 recent 01/25/2021. CHF; Stroke. ETOH.  Sonographer:    Sheralyn Boatman RDCS Referring Phys: [id number] CHRISTOPHER D MCAL[place] IMPRESSIONS  1. Left ventricular ejection fraction, by estimation, is <20%. The left ventricle has severely decreased function. The left ventricle demonstrates regional wall motion abnormalities (see scoring diagram/findings for description). The left ventricular internal cavity size was severely dilated. There is severe akinesis of the left ventricular, apical apical segment.  2. Right ventricular systolic function is normal. The right ventricular size is normal.  3. The aortic valve is grossly normal. Aortic valve regurgitation is not visualized. No aortic stenosis is present. FINDINGS  Left Ventricle: Left ventricular ejection fraction, by estimation, is  <20%. The left ventricle has severely decreased function. The left ventricle demonstrates regional wall motion abnormalities. Severe akinesis of the left ventricular, apical apical segment. Definity contrast agent was given IV to delineate the left ventricular endocardial borders. The left ventricular internal cavity size was severely dilated. Right Ventricle: The right ventricular size is normal. Right vetricular wall thickness was not well visualized. Right ventricular systolic function is normal. Aortic Valve: The aortic valve is grossly normal. Aortic valve regurgitation is not visualized. No aortic stenosis is present. LEFT VENTRICLE PLAX 2D LVIDd:         6.20 cm LVIDs:         6.00 cm LV PW:         1.90 cm LV IVS:        1.50 cm  LV Volumes (MOD) LV vol d, MOD A2C: 160.0 ml LV vol d, MOD A4C: 232.0 ml LV vol s, MOD A2C: 149.0 ml LV vol s, MOD A4C: 139.0 ml LV SV MOD A2C:     11.0 ml LV SV MOD A4C:     232.0 ml LV SV MOD BP:      57.7 ml LEFT ATRIUM         Index LA diam:    2.40 cm 1.56 cm/m   AORTA Ao Root diam: 3.80 cm [name] MD Electronically signed by [name] MD Signature Date/Time: 01/26/2021/4:40:52 PM    Final      I have independently reviewed the above radiologic studies and discussed with the patient   Recent Lab Findings: Lab Results  Component Value Date   WBC 3.6 (L) 01/26/2021   HGB 15.3 01/27/2021   HCT 45.0 01/27/2021   PLT 165 01/26/2021   GLUCOSE 91 01/27/2021   CHOL 185 01/24/2021   TRIG 71 01/24/2021   HDL 86 01/24/2021   LDLCALC 85 01/24/2021   ALT  18 01/23/2021   AST 34 01/23/2021   NA 135 01/27/2021   K 3.8 01/27/2021   CL 101 01/27/2021   CREATININE 1.30 (H) 01/27/2021   BUN 20 01/27/2021   CO2 23 01/27/2021   TSH 1.221 01/23/2021   INR 1.1 01/23/2021   HGBA1C 5.6 01/23/2021   Assessment / Plan:      1. CAD with LV Thrombus- he was treated with Plavix due to stroke on presentation, this has since be stopped.... He will require Eliquis at  time of discharge... patient has no cardiac symptoms and states he doesn't want surgery, but is willing to think about it 2. Stroke- per Neurology 3. HTN- on Entresto 4. Hyperlipidemia- on Lipitor 5. Dispo- patient presented with stroke, denies anginal symptoms.  LV thrombus will require blood thinner at discharge, patient stated he didn't want surgery, but would think about it.  Dr. Cornelius Moras to evaluate and follow up with further recommendations.  If he decides to proceed with surgery and is felt to be a candidate it would be next week at the earliest.  I  spent 55 minutes counseling the patient face to face.   Lowella Dandy, PA-C 01/27/2021 2:30 PM    I have seen and examined the patient and agree with the assessment as outlined above by Lowella Dandy, PA-C.  Patient is a 60 year old African-American male who has not had regular medical care for a long time but reports no previous history of coronary artery disease nor history of hypertension, hyperlipidemia, diabetes, or tobacco abuse.  He states that he was in his usual state of health until January 23, 2021 when he developed sudden onset aphasia and right-sided facial droop.  He was diagnosed with acute embolic stroke in left MCA territory confirmed with MRI of the brain.  Transthoracic and transesophageal echocardiograms were performed demonstrating the presence of severe left ventricular systolic dysfunction with ejection fraction estimated less than 20-25%.  There was no report of any clot in the left ventricle or left atrial appendage.  Patient was seen in follow-up by Dr. Clifton James and a repeat limited echocardiogram was performed with Definity contrast.  Again no LV thrombus was reported although subsequent second look review of the images with Definity contrast confirmed the presence of mural thrombus near the apex of the left ventricle.  Diagnostic cardiac catheterization was performed today revealing severe three-vessel coronary artery disease  with coronary anatomy relatively unfavorable for percutaneous core intervention.  The patient denies any history of chest pain or chest tightness either with activity or at rest.  He denies any history of shortness of breath either with activity or at rest.  He denies any other associated symptoms in the last few weeks or months which might correspond to a previous out of hospital acute myocardial infarction.  I have personally reviewed images from the patient's multiple echocardiograms and his diagnostic cardiac catheterization.  I agree there appears to be clear evidence of mural thrombus near the apex of the heart seen on yesterday's limited echocardiogram performed with Definity contrast.  I also agree that patient might ultimately benefit from coronary artery bypass grafting, but I would not recommend proceeding with elective surgical intervention at this time.  Rather, I would recommend anticoagulation for 3 months before proceeding with surgical revascularization.  It might be reasonable to consider MRI of the heart for viability study in 3 months.  This would also facilitate characterizing whether or not the LV mural thrombus has resolved.  In the meanwhile I do  not think it would be wise for the patient to drive an automobile or participate in any strenuous physical exertion.  Long-term medical follow-up will need to be arranged as well as medical therapy for risk factor management.  I discussed matters at length with the patient at the bedside.  All of his questions have been addressed.   I spent in excess of 60 minutes during the conduct of this hospital encounter and >50% of this time involved direct face-to-face encounter with the patient for counseling and/or coordination of their care.    Todd Nailslarence H Dadrian Ballantine, MD 01/27/2021 5:41 PM

## 2021-01-28 ENCOUNTER — Other Ambulatory Visit (HOSPITAL_COMMUNITY): Payer: Self-pay

## 2021-01-28 ENCOUNTER — Other Ambulatory Visit: Payer: Self-pay | Admitting: Family Medicine

## 2021-01-28 DIAGNOSIS — I63412 Cerebral infarction due to embolism of left middle cerebral artery: Secondary | ICD-10-CM

## 2021-01-28 LAB — CBC
HCT: 41.5 % (ref 39.0–52.0)
Hemoglobin: 13.7 g/dL (ref 13.0–17.0)
MCH: 25.8 pg — ABNORMAL LOW (ref 26.0–34.0)
MCHC: 33 g/dL (ref 30.0–36.0)
MCV: 78.2 fL — ABNORMAL LOW (ref 80.0–100.0)
Platelets: 132 10*3/uL — ABNORMAL LOW (ref 150–400)
RBC: 5.31 MIL/uL (ref 4.22–5.81)
RDW: 13.9 % (ref 11.5–15.5)
WBC: 4.1 10*3/uL (ref 4.0–10.5)
nRBC: 0 % (ref 0.0–0.2)

## 2021-01-28 LAB — BASIC METABOLIC PANEL
Anion gap: 7 (ref 5–15)
BUN: 20 mg/dL (ref 6–20)
CO2: 21 mmol/L — ABNORMAL LOW (ref 22–32)
Calcium: 8.3 mg/dL — ABNORMAL LOW (ref 8.9–10.3)
Chloride: 104 mmol/L (ref 98–111)
Creatinine, Ser: 1.19 mg/dL (ref 0.61–1.24)
GFR, Estimated: >60 mL/min (ref >60)
Glucose, Bld: 99 mg/dL (ref 70–99)
Potassium: 3.6 mmol/L (ref 3.5–5.1)
Sodium: 132 mmol/L — ABNORMAL LOW (ref 135–145)

## 2021-01-28 LAB — HEPARIN LEVEL (UNFRACTIONATED): Heparin Unfractionated: 0.16 IU/mL — ABNORMAL LOW (ref 0.30–0.70)

## 2021-01-28 MED ORDER — ASPIRIN 81 MG PO TBEC
81.0000 mg | DELAYED_RELEASE_TABLET | Freq: Every day | ORAL | 0 refills | Status: AC
Start: 2021-01-28 — End: ?
  Filled 2021-01-28: qty 30, 30d supply, fill #0

## 2021-01-28 MED ORDER — APIXABAN 5 MG PO TABS
5.0000 mg | ORAL_TABLET | Freq: Two times a day (BID) | ORAL | Status: DC
  Administered 2021-01-28: 5 mg via ORAL
  Filled 2021-01-28: qty 1

## 2021-01-28 MED ORDER — SACUBITRIL-VALSARTAN 24-26 MG PO TABS
1.0000 | ORAL_TABLET | Freq: Two times a day (BID) | ORAL | 0 refills | Status: DC
  Filled 2021-01-28: qty 60, 30d supply, fill #0

## 2021-01-28 MED ORDER — SPIRONOLACTONE 25 MG PO TABS
12.5000 mg | ORAL_TABLET | Freq: Every day | ORAL | 1 refills | Status: DC
  Filled 2021-01-28: qty 15, 30d supply, fill #0

## 2021-01-28 MED ORDER — APIXABAN 5 MG PO TABS: 5.0000 mg | ORAL_TABLET | Freq: Two times a day (BID) | ORAL | Status: DC

## 2021-01-28 MED ORDER — ATORVASTATIN CALCIUM 20 MG PO TABS
20.0000 mg | ORAL_TABLET | Freq: Every day | ORAL | 0 refills | Status: DC
  Filled 2021-01-28: qty 30, 30d supply, fill #0

## 2021-01-28 MED ORDER — APIXABAN 5 MG PO TABS
5.0000 mg | ORAL_TABLET | Freq: Two times a day (BID) | ORAL | 0 refills | Status: DC
  Filled 2021-01-28: qty 60, 30d supply, fill #0

## 2021-01-28 MED ORDER — CARVEDILOL 6.25 MG PO TABS
6.2500 mg | ORAL_TABLET | Freq: Two times a day (BID) | ORAL | 0 refills | Status: DC
  Filled 2021-01-28: qty 60, 30d supply, fill #0

## 2021-01-28 NOTE — Progress Notes (Addendum)
Heart Failure Nurse Navigator Progress Note  Pt ambulating to toilet independently, tolerated well. Expressive aphasia noted.   Scheduled HV TOC appt. April 19 @ 11AM. Enrolled and set up [employer].   Education Assessment and Provision:  Detailed education and instructions provided on heart failure disease management including the following:  Signs and symptoms of Heart Failure When to call the physician Importance of daily weights Low sodium diet Fluid restriction Medication management Anticipated future follow-up appointments  Patient education given on each of the above topics.  Patient acknowledges understanding via teach back method and acceptance of all instructions.  Education Materials:  "Living Better With Heart Failure" Booklet, HF zone tool, & Daily Weight Tracker Tool.  Patient has scale at home: no, given from AHF clinic. Patient has pill box at home: no, given from AHF clinic.   Assisted pt with setting up pill box with medications present from [facility] pharmacy at bedside. Pt had some difficulty understanding when to take pills twice daily. Pt occasionally doubled some slots with same medication, pt did not check notice until Navigator stopped him. Concern for pt capacity s/p CVA to remember to take medications even with pill box and alarm reminder on phone. Pt instructed to bring box and pill bottles to next appointment 4/19, will work with pharmacist/RN to set up for the next week.    Items for Follow-up on DC/TOC: -PCP -Cardiology- new with Dr. Elease Hashimoto.  -SW: insurance, financial strain. -medication compliance/cost. -continue HF education. -pill box organization  Ozella Rocks, RN, BSN Heart Failure Nurse Navigator [phone]

## 2021-01-28 NOTE — Progress Notes (Signed)
ANTICOAGULATION CONSULT NOTE  Pharmacy Consult for heparin  Indication: LV thrombus  No Known Allergies  Patient Measurements: Height: 5\' 5"  (165.1 cm) Weight: 56.3 kg (124 lb 3.2 oz) IBW/kg (Calculated) : 61.5   Vital Signs: Temp: 98.3 F (36.8 C) (04/14 0012) Temp Source: Oral (04/14 0012) BP: 117/87 (04/14 0012) Pulse Rate: 80 (04/14 0012)  Labs: Recent Labs    01/26/21 0427 01/26/21 1931 01/27/21 0600 01/27/21 1056 01/27/21 1057 01/27/21 1103 01/28/21 0334  HGB  --  14.5  --    < > 15.6 15.3 13.7  HCT  --  44.2  --    < > 46.0 45.0 41.5  PLT  --  165  --   --   --   --  132*  HEPARINUNFRC  --   --   --   --   --   --  0.16*  CREATININE 1.28*  --  1.30*  --   --   --   --    < > = values in this interval not displayed.    Estimated Creatinine Clearance: 48.1 mL/min (A) (by C-G formula based on SCr of 1.3 mg/dL (H)).   Medical History: Past Medical History:  Diagnosis Date  . Asthma due to seasonal allergies     Assessment: 28 you male with CVA and LV thrombus s/p cath for CABG consult. Pharmacy consulted to start heparin 6 hours post sheath (removed ~ 11:10am)  Heparin level subtherapeutic (0.16) on gtt at 750 units/hr. No issues with line or bleeding reported per RN. Plt down to 132.   Goal of Therapy:  Heparin level 0.3-0.5 units/ml Monitor platelets by anticoagulation protocol: Yes   Plan:  Increase heparin to 900 units/hr Will f/u 6 hr heparin level  67, PharmD, BCPS Please see amion for complete clinical pharmacist phone list 01/28/2021 4:16 AM

## 2021-01-28 NOTE — Discharge Summary (Signed)
Physician Discharge Summary  Todd Vega AVW:[id number] DOB: 08-24-1961 DOA: 01/23/2021  PCP: Pcp, No  Admit date: 01/23/2021 Discharge date: 01/28/2021  Admitted From: Home  Disposition:  Home   Recommendations for Outpatient Follow-up:  1. Follow up with new PCP Gwinda Passe in 1-2 weeks 2. Follow upw with Cardiology  3. Please obtain BMP in one week on new Entresto, spironolactone 4. Follow up with CT surgery in 3 months  5. Follow up with Neurology in 4-6 weeks  Home Health: None  Equipment/Devices: None new  Discharge Condition: Good  CODE STATUS: FULL Diet recommendation: Cardiac  Brief/Interim Summary: Todd Vega is a 60 y.o. M with allergies who presented with acute onset aphasia and facial droop.  Patient awoke in the night with aphasia, and called EMS.    In the ER, CT head showed acute stroke.  He was hypertensive.  He was outside the tPA window.  Admitted for stroke work up.     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  Non-invasive angiography showed mild atherosclerotic change.  Carotid imaging without stenosis.  Echo showed reduced EF and LV thrombus.    Lipids ordered, discharged on statin.  Apirin ordered, discharged on Eliquis and low dose aspirin.  PT recommended outpatient OT and SLP.  Atrial fibrillation not present on monitoring.  Non-smoker.  Dysphagia screen ordered in ER.  Non-smoker.    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  This was transitioned to Eliquis at discharge.  He will complete 3 months Eliquis at least and Cardiology and Cardiothoracic surgery will repeat imaging at their discretion to evaluate for persistence of LV thrombus.       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  Cardiology were consulted recommended angiography that showed diffuse atherosclerotic disease, and reduced EF.  Patient was started on carvedilol, Entresto, spironolactone, aspirin, and statin.    Close Cardiology follow up arranged.  -Obtain BMP in 1 week   Hypertension BP normalized on new carvedilol, Entresto, and spironolactone.    Hyponatremia  Hypomagnesemia Resolved with repletion          Discharge Instructions  Discharge Instructions    Ambulatory referral to Occupational Therapy   Complete by: As directed    Ambulatory referral to Speech Therapy   Complete by: As directed    Diet - low sodium heart healthy   Complete by: As directed    Discharge instructions   Complete by: As directed    From Dr. Maryfrances Bunnell:  You were admitted with a stroke  Here, we found that the stroke was likely from bits of clot from your heart, that floated to the brain and blocked an artery to the brain.  For treatment: 1. Go to rehab for "Occupational Therapy" and "Speech Therapy" 2. Take a cholesterol medicine atorvastatin 20 mg nightly (to reduce risk of another stroke) 3. Take the blood thinner apixaban/Eliquis 5 mg TWICE daily to help the body dissolve clot in heart 4. Take low dose aspirin 81 mg daily    The clot in the heart developed because the heart squeeze is weak. It is weak because you have a lot of plaque buildup in your heart arteries. The treatment for this is often  bypass surgery, by a heart surgeon like Dr. Barry Dienes For now, the medicines for your stroke will also protect the heart  In addition to aspirin, apixaban (the blood thinner), and the cholesterol medicine, you should take the following heart protective medicines: 1. Take carvedilol/Coreg 6.25 mg twice daily 2. Take sacubitril/valsartan/Entresto 24/26 mg twice daily 3. Take spironolactone/Aldactone 12.5 mg  (half tablet) once daily   Follow up with Dr. Gibson Ramp office (Cardiology) as instructed Follow up with the Heart Surgeon Dr. Barry Dienes as well    Return to the hospital for: Any new weakness in the arms or legs, any new numbness, and vision change, dizziness Any new chest pain or trouble breathing Any new black and tarry or bloody bowel movements    Follow up with your new primary care doctor too Take all your medicines, if there is an issue obtaining them, call the doctor first!   Increase activity slowly   Complete by: As directed      Allergies as of 01/28/2021   No Known Allergies     Medication List    TAKE these medications   Aspirin Low Dose 81 MG EC tablet Generic drug: aspirin Take 1 tablet (81 mg total) by mouth daily. Swallow whole.   atorvastatin 20 MG tablet Commonly known as: LIPITOR Take 1 tablet (20 mg total) by mouth daily.   carvedilol 6.25 MG tablet Commonly known as: COREG Take 1 tablet (6.25 mg total) by mouth 2 (two) times daily with a meal.   Eliquis 5 MG Tabs tablet Generic drug: apixaban Take 1 tablet (5 mg total) by mouth 2 (two) times daily.   Entresto 24-26 MG Generic drug: sacubitril-valsartan Take 1 tablet by mouth 2 (two) times daily.   fexofenadine 180 MG tablet Commonly known as: ALLEGRA Take 180 mg by mouth as needed for allergies or rhinitis.   spironolactone 25 MG tablet Commonly known as: ALDACTONE Take 0.5 tablets (12.5 mg total) by mouth daily.       Follow-up Information    [facility] Follow up on 02/11/2021.   Specialty: Family Medicine Why: Your appointment is at 8:30 am. Please arrive early and bring a picture ID and your current medications. Contact information: [address] [place] [address] [phone]       [facility] Follow up.   Why: The home health agency will contact you for the first home visit. Contact information: [phone]        Kathleene Hazel, MD. Schedule an appointment as soon as possible for a visit in 2 week(s).   Specialty: Cardiology Contact information: [address] [place] [address] [phone]        Purcell Nails, MD. Go in 1 month(s).   Specialty: Cardiothoracic Surgery Contact information: [address] [place] [address] [place] [address] [phone]        [facility]. Go on 02/02/2021.   Specialty: Cardiology Why: AT 11AM. [facility] Bring all yourmedicaditons with you. This appt is at least 1 hour. [facility] will pick you up and take you home.  Contact information: [address] [id number] mc [place] [address] [phone]             No Known Allergies  Consultations:  Neurology  Cardiology  Cardiothoracic surgery   Procedures/Studies: CT ANGIO HEAD W OR WO CONTRAST  Result Date: 01/23/2021 CLINICAL  DATA:  Stroke-like symptoms EXAM: CT ANGIOGRAPHY HEAD AND NECK CT PERFUSION BRAIN TECHNIQUE: Multidetector CT imaging of the head and neck was performed using the standard protocol during bolus administration of intravenous contrast. Multiplanar CT image reconstructions and MIPs were obtained to evaluate the vascular anatomy. Carotid stenosis measurements (when applicable) are obtained utilizing NASCET criteria, using the distal internal carotid diameter as the denominator. Multiphase CT imaging of the brain was performed following IV bolus contrast injection. Subsequent parametric perfusion maps were calculated using RAPID software. CONTRAST:  Dose is currently not known on this in progress study COMPARISON:  Contemporaneous head CT FINDINGS: CTA NECK FINDINGS Aortic arch: No acute finding. Mild atheromatous wall thickening. Three vessel branching. Right carotid system: Vessels are smooth and widely patent with no significant atheromatous  changes. Left carotid system: Vessels are smooth and widely patent. Mild atheromatous plaque at the bifurcation. Vertebral arteries: No proximal subclavian stenosis. Codominant vertebral arteries that are smooth and widely patent to the dura. Skeleton: No acute finding. Dental caries with periapical erosions and torus mandibularis. Other neck: Dense calcification in the superficial left parotid. Upper chest: Clustered small pulmonary nodules in the right upper lobe which have an inflammatory pattern. Review of the MIP images confirms the above findings CTA HEAD FINDINGS Anterior circulation: Atheromatous plaque on the carotid siphons. No branch occlusion, beading, or proximal flow limiting stenosis. Posterior circulation: Vertebral and basilar arteries are smooth and widely patent. No proximal branch occlusion, beading, or aneurysm. Venous sinuses: Diffusely patent Anatomic variants: None significant Review of the MIP images confirms the above findings CT Brain Perfusion Findings: ASPECTS: 9 CBF (<30%) Volume: 0mL Perfusion (Tmax>6.0s) volume: 17mL Mismatch Volume: 17mL IMPRESSION: 1. No emergent large vessel occlusion. 2. 17 cc ischemic area in the lateral left frontal region, some of which is completed infarct by noncontrast CT. 3. Mild atheromatous changes without proximal flow limiting stenosis or embolic source. Electronically Signed   By: Marnee SpringJonathon  Watts M.D.   On: 01/23/2021 05:59   CT ANGIO NECK W OR WO CONTRAST  Result Date: 01/23/2021 CLINICAL DATA:  Stroke-like symptoms EXAM: CT ANGIOGRAPHY HEAD AND NECK CT PERFUSION BRAIN TECHNIQUE: Multidetector CT imaging of the head and neck was performed using the standard protocol during bolus administration of intravenous contrast. Multiplanar CT image reconstructions and MIPs were obtained to evaluate the vascular anatomy. Carotid stenosis measurements (when applicable) are obtained utilizing NASCET criteria, using the distal internal carotid diameter as the  denominator. Multiphase CT imaging of the brain was performed following IV bolus contrast injection. Subsequent parametric perfusion maps were calculated using RAPID software. CONTRAST:  Dose is currently not known on this in progress study COMPARISON:  Contemporaneous head CT FINDINGS: CTA NECK FINDINGS Aortic arch: No acute finding. Mild atheromatous wall thickening. Three vessel branching. Right carotid system: Vessels are smooth and widely patent with no significant atheromatous changes. Left carotid system: Vessels are smooth and widely patent. Mild atheromatous plaque at the bifurcation. Vertebral arteries: No proximal subclavian stenosis. Codominant vertebral arteries that are smooth and widely patent to the dura. Skeleton: No acute finding. Dental caries with periapical erosions and torus mandibularis. Other neck: Dense calcification in the superficial left parotid. Upper chest: Clustered small pulmonary nodules in the right upper lobe which have an inflammatory pattern. Review of the MIP images confirms the above findings CTA HEAD FINDINGS Anterior circulation: Atheromatous plaque on the carotid siphons. No branch occlusion, beading, or proximal flow limiting stenosis. Posterior circulation: Vertebral and basilar arteries are smooth and widely patent.  No proximal branch occlusion, beading, or aneurysm. Venous sinuses: Diffusely patent Anatomic variants: None significant Review of the MIP images confirms the above findings CT Brain Perfusion Findings: ASPECTS: 9 CBF (<30%) Volume: 0mL Perfusion (Tmax>6.0s) volume: 17mL Mismatch Volume: 17mL IMPRESSION: 1. No emergent large vessel occlusion. 2. 17 cc ischemic area in the lateral left frontal region, some of which is completed infarct by noncontrast CT. 3. Mild atheromatous changes without proximal flow limiting stenosis or embolic source. Electronically Signed   By: Marnee Spring M.D.   On: 01/23/2021 05:59   MR BRAIN WO CONTRAST  Result Date:  01/23/2021 CLINICAL DATA:  Acute stroke. EXAM: MRI HEAD WITHOUT CONTRAST TECHNIQUE: Multiplanar, multiecho pulse sequences of the brain and surrounding structures were obtained without intravenous contrast. COMPARISON:  CT and CTA with CT perfusion from earlier today FINDINGS: Brain: Restricted diffusion in the posterior and lateral left frontal cortex and subjacent white matter. This is the area that was positive on noncontrast head CT. Less prominent FLAIR signal in this area but there are is early detectable swelling and cortical hyperintensity on a few slices. Two subcentimeter areas are seen at the MCA watershed zone at the frontal and occipital lobes, too small for FLAIR correlation on this motion degraded scan. Background chronic small vessel ischemia. Small remote left inferior cerebellar infarct. Remote micro hemorrhages in the right thalamus and right cerebellum. Vascular: Normal flow voids. Skull and upper cervical spine: Negative Sinuses/Orbits: Retention cysts in the left maxillary sinus. Other: Progressively motion degraded. IMPRESSION: 1. Band of restricted diffusion in the lateral left frontal lobe. FLAIR changes are less apparent but this area has the appearance of completed infarct by CT. 2. Smaller areas of altered diffusion at the border zone of the left MCA territory. 3. Background chronic small vessel ischemia. 4. Progressively motion degraded study. Electronically Signed   By: Marnee Spring M.D.   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  Ostial LAD 85%, calcified.  First diagonal is large and contains 70 to 80% ostial narrowing and 90% stenosis of the smaller branch.  Ostial ramus 70 to 80%, calcified.  Ostial circumflex 50 to 70%, and calcified.  Proximal to mid circumflex 90%.  RCA contains eccentric bulky 30 to 40% stenosis.  Right heart pressures were normal.  Mean capillary wedge pressure 5 mmHg.   Mean pulmonary artery pressure 9 mmHg. RECOMMENDATIONS:  Consider revascularization options.  Consider viability study.  Coronary anatomy is surgical.  Would be difficult to perform PCI and not have ischemic complications.  CT CEREBRAL PERFUSION W CONTRAST  Result Date: 01/23/2021 CLINICAL DATA:  Stroke-like symptoms EXAM: CT ANGIOGRAPHY HEAD AND NECK CT PERFUSION BRAIN TECHNIQUE: Multidetector CT imaging of the head and neck was performed using the standard protocol during bolus administration of intravenous contrast. Multiplanar CT image reconstructions and MIPs were obtained to evaluate the vascular anatomy. Carotid stenosis measurements (when applicable) are obtained utilizing NASCET criteria, using the distal internal carotid diameter as the denominator. Multiphase CT imaging of the brain was performed following IV bolus contrast injection. Subsequent parametric perfusion maps were calculated using RAPID software. CONTRAST:  Dose is currently not known on this in progress study COMPARISON:  Contemporaneous head CT FINDINGS: CTA NECK FINDINGS Aortic arch: No acute finding. Mild atheromatous wall thickening. Three vessel branching. Right carotid system: Vessels are smooth and widely patent with no significant atheromatous changes. Left carotid system: Vessels are smooth and widely patent. Mild  atheromatous plaque at the bifurcation. Vertebral arteries: No proximal subclavian stenosis. Codominant vertebral arteries that are smooth and widely patent to the dura. Skeleton: No acute finding. Dental caries with periapical erosions and torus mandibularis. Other neck: Dense calcification in the superficial left parotid. Upper chest: Clustered small pulmonary nodules in the right upper lobe which have an inflammatory pattern. Review of the MIP images confirms the above findings CTA HEAD FINDINGS Anterior circulation: Atheromatous plaque on the carotid siphons. No branch occlusion, beading, or proximal flow limiting  stenosis. Posterior circulation: Vertebral and basilar arteries are smooth and widely patent. No proximal branch occlusion, beading, or aneurysm. Venous sinuses: Diffusely patent Anatomic variants: None significant Review of the MIP images confirms the above findings CT Brain Perfusion Findings: ASPECTS: 9 CBF (<30%) Volume: 35mL Perfusion (Tmax>6.0s) volume: 77mL Mismatch Volume: 87mL IMPRESSION: 1. No emergent large vessel occlusion. 2. 17 cc ischemic area in the lateral left frontal region, some of which is completed infarct by noncontrast CT. 3. Mild atheromatous changes without proximal flow limiting stenosis or embolic source. Electronically Signed   By: Marnee Spring M.D.   On: 01/23/2021 05:59   VAS [place] TRANSCRANIAL DOPPLER W BUBBLES  Result Date: 01/26/2021  Transcranial Doppler with Bubble Indications: Stroke. History: Small acute cortical infarct in the left frontal region. Comparison Study: No prior study on file Performing Technologist: Sherren Kerns RVS Supporting Technologist: Jean Vega RDMS,RVT  Examination Guidelines: A complete evaluation includes B-mode imaging, spectral Doppler, color Doppler, and power Doppler as needed of all accessible portions of each vessel. Bilateral testing is considered an integral part of a complete examination. Limited examinations for reoccurring indications may be performed as noted.  Summary: No HITS at rest or during Valsalva. Negative transcranial Doppler Bubble study with no evidence of right to left intracardiac communication.  NEGATIVE TCD Bubble study *See table(s) above for TCD measurements and observations.  Diagnosing physician: Delia Heady MD Electronically signed by Delia Heady MD on 01/26/2021 at 12:57:14 PM.    Final    ECHOCARDIOGRAM COMPLETE  Result Date: 01/23/2021    ECHOCARDIOGRAM REPORT   Patient Name:   Todd Vega Date of Exam: 01/23/2021 Medical Rec #:  [id number]     Height: Accession #:    [id number]    Weight:       133.2 lb Date  of Birth:  [birth year]      BSA:          1.540 m Patient Age:    60 years      BP:           157/94 mmHg Patient Gender: M             HR:           94 bpm. Exam Location:  Inpatient Procedure: 2D Echo Indications:    stroke  History:        Patient has no prior history of Echocardiogram examinations.  Sonographer:    Delcie Roch Referring Phys: [phone] RACHAL A Cagney IMPRESSIONS  1. Left ventricular ejection fraction, by estimation, is 25 to 30%. The left ventricle has severely decreased function. The left ventricle demonstrates global hypokinesis. The left ventricular internal cavity size was severely dilated. There is mild asymmetric left ventricular hypertrophy of the basal and septal segments. Left ventricular diastolic parameters are consistent with Grade I diastolic dysfunction (impaired relaxation).  2. Right ventricular systolic function is normal. The right ventricular size is normal.  3. Left atrial size was moderately dilated.  4. The mitral valve is normal in structure. Mild mitral valve regurgitation. No evidence of mitral stenosis.  5. The aortic valve is tricuspid. There is mild calcification of the aortic valve. Aortic valve regurgitation is not visualized. Mild aortic valve sclerosis is present, with no evidence of aortic valve stenosis.  6. Aortic dilatation noted. There is mild dilatation of the aortic root, measuring 40 mm.  7. The inferior vena cava is normal in size with greater than 50% respiratory variability, suggesting right atrial pressure of 3 mmHg. FINDINGS  Left Ventricle: Left ventricular ejection fraction, by estimation, is 25 to 30%. The left ventricle has severely decreased function. The left ventricle demonstrates global hypokinesis. The left ventricular internal cavity size was severely dilated. There is mild asymmetric left ventricular hypertrophy of the basal and septal segments. Left ventricular diastolic parameters are consistent with Grade I diastolic dysfunction (impaired  relaxation). Right Ventricle: The right ventricular size is normal. No increase in right ventricular wall thickness. Right ventricular systolic function is normal. Left Atrium: Left atrial size was moderately dilated. Right Atrium: Right atrial size was not well visualized. Pericardium: There is no evidence of pericardial effusion. Mitral Valve: The mitral valve is normal in structure. Mild mitral valve regurgitation. No evidence of mitral valve stenosis. Tricuspid Valve: The tricuspid valve is normal in structure. Tricuspid valve regurgitation is trivial. No evidence of tricuspid stenosis. Aortic Valve: The aortic valve is tricuspid. There is mild calcification of the aortic valve. Aortic valve regurgitation is not visualized. Mild aortic valve sclerosis is present, with no evidence of aortic valve stenosis. Pulmonic Valve: The pulmonic valve was normal in structure. Pulmonic valve regurgitation is not visualized. No evidence of pulmonic stenosis. Aorta: The aortic root is normal in size and structure and aortic dilatation noted. There is mild dilatation of the aortic root, measuring 40 mm. Venous: The inferior vena cava is normal in size with greater than 50% respiratory variability, suggesting right atrial pressure of 3 mmHg. IAS/Shunts: No atrial level shunt detected by color flow Doppler.  LEFT VENTRICLE PLAX 2D LVIDd:         5.90 cm      Diastology LVIDs:         5.50 cm      LV e' medial:    3.59 cm/s LV PW:         1.30 cm      LV E/e' medial:  19.2 LV IVS:        1.30 cm      LV e' lateral:   5.66 cm/s LVOT diam:     2.10 cm      LV E/e' lateral: 12.2 LV SV:         33 LV SV Index:   21 LVOT Area:     3.46 cm  LV Volumes (MOD) LV vol d, MOD A4C: 191.0 ml LV vol s, MOD A4C: 150.0 ml LV SV MOD A4C:     191.0 ml RIGHT VENTRICLE             IVC RV S prime:     14.30 cm/s  IVC diam: 1.30 cm TAPSE (M-mode): 2.1 cm LEFT ATRIUM             Index       RIGHT ATRIUM           Index LA diam:        4.50 cm 2.92  cm/m  RA Area:     13.20 cm LA  Vol [facility]):   72.7 ml 47.22 ml/m RA Volume:   28.60 ml  18.58 ml/m LA Vol (A4C):   48.2 ml 31.31 ml/m LA Biplane Vol: 63.7 ml 41.37 ml/m  AORTIC VALVE LVOT Vmax:   65.60 cm/s LVOT Vmean:  40.300 cm/s LVOT VTI:    0.096 m  AORTA Ao Root diam: 4.00 cm Ao Asc diam:  3.10 cm MV E velocity: 69.00 cm/s MV A velocity: 96.80 cm/s  SHUNTS MV E/A ratio:  0.71        Systemic VTI:  0.10 m                            Systemic Diam: 2.10 cm Charlton Haws MD Electronically signed by Charlton Haws MD Signature Date/Time: 01/23/2021/11:39:58 AM    Final    ECHO TEE  Result Date: 01/25/2021    TRANSESOPHOGEAL ECHO REPORT   Patient Name:   Todd Vega Date of Exam: 01/25/2021 Medical Rec #:  [id number]     Height: Accession #:    [id number]    Weight:       133.2 lb Date of Birth:  Dec 15, 1960      BSA:          1.540 m Patient Age:    60 years      BP:           116/77 mmHg Patient Gender: M             HR:           100 bpm. Exam Location:  Inpatient Procedure: Transesophageal Echo, Cardiac Doppler, Color Doppler and Saline            Contrast Bubble Study Indications:     CVA  History:         Patient has prior history of Echocardiogram examinations, most                  recent 01/23/2021. Cardiomyopathy, Stroke; Risk                  Factors:Hypertension and Dyslipidemia.  Sonographer:     Lavenia Atlas Referring Phys:  [id number] Kathleene Hazel Diagnosing Phys: Jodelle Red MD PROCEDURE: After discussion of the risks and benefits of a TEE, an informed consent was obtained from the patient. The transesophogeal probe was passed without difficulty through the esophogus of the patient. Sedation performed by different physician. The patient was monitored while under deep sedation. Anesthestetic sedation was provided intravenously by Anesthesiology:  of Propofol,  of Lidocaine. Image quality was good. The patient's vital signs; including heart rate, blood pressure, and oxygen  saturation; remained stable throughout the procedure. The patient developed no complications during the procedure. IMPRESSIONS  1. Left ventricular ejection fraction, by estimation, is 20 to 25%. The left ventricle has severely decreased function. The left ventricle demonstrates global hypokinesis. The left ventricular internal cavity size was severely dilated.  2. Right ventricular systolic function is normal. The right ventricular size is normal.  3. Left atrial size was moderately dilated. No left atrial/left atrial appendage thrombus was detected.  4. Right atrial size was mildly dilated.  5. The mitral valve is normal in structure. Mild mitral valve regurgitation.  6. The aortic valve is tricuspid. Aortic valve regurgitation is not visualized. No aortic stenosis is present.  7. Agitated saline contrast bubble study was negative, with no evidence of any interatrial shunt. Conclusion(s)/Recommendation(s): No LA/LAA thrombus identified.  Negative bubble study for interatrial shunt. No intracardiac source of embolism detected on this on this transesophageal echocardiogram. Severely reduced LVEF. FINDINGS  Left Ventricle: Left ventricular ejection fraction, by estimation, is 20 to 25%. The left ventricle has severely decreased function. The left ventricle demonstrates global hypokinesis. The left ventricular internal cavity size was severely dilated. Right Ventricle: The right ventricular size is normal. No increase in right ventricular wall thickness. Right ventricular systolic function is normal. Left Atrium: Left atrial size was moderately dilated. No left atrial/left atrial appendage thrombus was detected. Right Atrium: Right atrial size was mildly dilated. Pericardium: There is no evidence of pericardial effusion. Mitral Valve: The mitral valve is normal in structure. Mild mitral valve regurgitation. There is no evidence of mitral valve vegetation. Tricuspid Valve: The tricuspid valve is normal in structure.  Tricuspid valve regurgitation is trivial. No evidence of tricuspid stenosis. There is no evidence of tricuspid valve vegetation. Aortic Valve: The aortic valve is tricuspid. Aortic valve regurgitation is not visualized. No aortic stenosis is present. Aortic valve peak gradient measures 2.5 mmHg. There is no evidence of aortic valve vegetation. Pulmonic Valve: The pulmonic valve was grossly normal. Pulmonic valve regurgitation is trivial. There is no evidence of pulmonic valve vegetation. Aorta: The aortic root and ascending aorta are structurally normal, with no evidence of dilitation. IAS/Shunts: The interatrial septum appears to be lipomatous. No atrial level shunt detected by color flow Doppler. Agitated saline contrast was given intravenously to evaluate for intracardiac shunting. Agitated saline contrast bubble study was negative,  with no evidence of any interatrial shunt.  AORTIC VALVE AV Vmax:      78.65 cm/s AV Peak Grad: 2.5 mmHg Jodelle Red MD Electronically signed by Jodelle Red MD Signature Date/Time: 01/25/2021/2:11:52 PM    Final    CT HEAD CODE STROKE WO CONTRAST  Result Date: 01/23/2021 CLINICAL DATA:  Code stroke.  Aphasia and right facial droop. EXAM: CT HEAD WITHOUT CONTRAST TECHNIQUE: Contiguous axial images were obtained from the base of the skull through the vertex without intravenous contrast. COMPARISON:  None. FINDINGS: Brain: Small area of gray-white matter differentiation loss along the high and posterior left frontal cortex. No acute hemorrhage, hydrocephalus, or masslike finding. Small remote left cerebellar infarct. Vascular: No definite hyperdense vessel in all planes. Skull: Normal. Negative for fracture or focal lesion. Sinuses/Orbits: Gaze to the left. Other: These results were communicated to Dr. Iver Nestle at 5:43 amon 4/9/2022by text page via the [facility] messaging system. ASPECTS [facility] Stroke Program Early CT Score) - Ganglionic level infarction (caudate,  lentiform nuclei, internal capsule, insula, M1-M3 cortex): 7 - Supraganglionic infarction (M4-M6 cortex): 2 Total score (0-10 with 10 being normal): 9 IMPRESSION: 1. 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 2. No acute hemorrhage. 3. Small remote left cerebellar infarct. Electronically Signed   By: Marnee Spring M.D.   On: 01/23/2021 05:44   ECHOCARDIOGRAM LIMITED  Result Date: 01/26/2021    ECHOCARDIOGRAM LIMITED REPORT   Patient Name:   Todd Vega Date of Exam: 01/26/2021 Medical Rec #:  [id number]     Height: Accession #:    [id number]    Weight:       133.2 lb Date of Birth:  26-Apr-1961      BSA:          1.540 m Patient Age:    60 years      BP:           142/93 mmHg Patient Gender: M  HR:           78 bpm. Exam Location:  Inpatient Procedure: Limited Echo and Intracardiac Opacification Agent Indications:    Rule out LV thrombus; I50.40* Unspecified combined systolic                 (congestive) and diastolic (congestive) heart failure  History:        Patient has prior history of Echocardiogram examinations, most                 recent 01/25/2021. CHF; Stroke. ETOH.  Sonographer:    Sheralyn Boatman RDCS Referring Phys: [id number] Camrynn Mcclintic D MCALHANY IMPRESSIONS  1. Left ventricular ejection fraction, by estimation, is <20%. The left ventricle has severely decreased function. The left ventricle demonstrates regional wall motion abnormalities (see scoring diagram/findings for description). The left ventricular internal cavity size was severely dilated. There is severe akinesis of the left ventricular, apical apical segment.  2. Right ventricular systolic function is normal. The right ventricular size is normal.  3. The aortic valve is grossly normal. Aortic valve regurgitation is not visualized. No aortic stenosis is present. FINDINGS  Left Ventricle: Left ventricular ejection fraction, by estimation, is <20%. The left ventricle has severely decreased function. The left  ventricle demonstrates regional wall motion abnormalities. Severe akinesis of the left ventricular, apical apical segment. Definity contrast agent was given IV to delineate the left ventricular endocardial borders. The left ventricular internal cavity size was severely dilated. Right Ventricle: The right ventricular size is normal. Right vetricular wall thickness was not well visualized. Right ventricular systolic function is normal. Aortic Valve: The aortic valve is grossly normal. Aortic valve regurgitation is not visualized. No aortic stenosis is present. LEFT VENTRICLE PLAX 2D LVIDd:         6.20 cm LVIDs:         6.00 cm LV PW:         1.90 cm LV IVS:        1.50 cm  LV Volumes (MOD) LV vol d, MOD A2C: 160.0 ml LV vol d, MOD A4C: 232.0 ml LV vol s, MOD A2C: 149.0 ml LV vol s, MOD A4C: 139.0 ml LV SV MOD A2C:     11.0 ml LV SV MOD A4C:     232.0 ml LV SV MOD BP:      57.7 ml LEFT ATRIUM         Index LA diam:    2.40 cm 1.56 cm/m   AORTA Ao Root diam: 3.80 cm [name] MD Electronically signed by [name] MD Signature Date/Time: 01/26/2021/4:40:52 PM    Final        Subjective: No fever, no chest pain, no respiratory distress, no dyspnea, no cough, no leg swelling.  Discharge Exam: Vitals:   01/28/21 0956 01/28/21 1023  BP: (!) 137/97 (!) 137/97  Pulse: 90 90  Resp:  16  Temp:  98.5 F (36.9 C)  SpO2:  97%   Vitals:   01/28/21 0012 01/28/21 0501 01/28/21 0956 01/28/21 1023  BP: 117/87 118/83 (!) 137/97 (!) 137/97  Pulse: 80 88 90 90  Resp:  14  16  Temp: 98.3 F (36.8 C) 97.8 F (36.6 C)  98.5 F (36.9 C)  TempSrc: Oral Oral  Oral  SpO2: 99% 98%  97%  Weight:  59.9 kg    Height:        General: Pt is alert, awake, not in acute distress Cardiovascular: RRR, nl S1-S2, no murmurs  appreciated.   No LE edema.   Respiratory: Normal respiratory rate and rhythm.  CTAB without rales or wheezes. Abdominal: Abdomen soft and non-tender.  No distension or HSM.   Neuro/Psych:  Strength symmetric in upper and lower extremities. Densely aphasic. Judgment and insight appear normal.   The results of significant diagnostics from this hospitalization (including imaging, microbiology, ancillary and laboratory) are listed below for reference.     Microbiology: Recent Results (from the past 240 hour(s))  Resp Panel by RT-PCR (Flu A&B, Covid) Nasopharyngeal Swab     Status: None   Collection Time: 01/23/21  5:31 AM   Specimen: Nasopharyngeal Swab; Nasopharyngeal(NP) swabs in vial transport medium  Result Value Ref Range Status   SARS Coronavirus 2 by RT PCR NEGATIVE NEGATIVE Final    Comment: (NOTE) SARS-CoV-2 target nucleic acids are NOT DETECTED.  The SARS-CoV-2 RNA is generally detectable in upper respiratory specimens during the acute phase of infection. The lowest concentration of SARS-CoV-2 viral copies this assay can detect is 138 copies/mL. A negative result does not preclude SARS-Cov-2 infection and should not be used as the sole basis for treatment or other patient management decisions. A negative result may occur with  improper specimen collection/handling, submission of specimen other than nasopharyngeal swab, presence of viral mutation(s) within the areas targeted by this assay, and inadequate number of viral copies(<138 copies/mL). A negative result must be combined with clinical observations, patient history, and epidemiological information. The expected result is Negative.  Fact Sheet for Patients:  BloggerCourse.com  Fact Sheet for Healthcare Providers:  SeriousBroker.it  This test is no t yet approved or cleared by the [place] FDA and  has been authorized for detection and/or diagnosis of SARS-CoV-2 by FDA under an Emergency Use Authorization (EUA). This EUA will remain  in effect (meaning this test can be used) for the duration of the COVID-19 declaration under Section 564(b)(1) of the  Act, 21 U.S.C.section 360bbb-3(b)(1), unless the authorization is terminated  or revoked sooner.       Influenza A by PCR NEGATIVE NEGATIVE Final   Influenza B by PCR NEGATIVE NEGATIVE Final    Comment: (NOTE) The Xpert Xpress SARS-CoV-2/FLU/RSV plus assay is intended as an aid in the diagnosis of influenza from Nasopharyngeal swab specimens and should not be used as a sole basis for treatment. Nasal washings and aspirates are unacceptable for Xpert Xpress SARS-CoV-2/FLU/RSV testing.  Fact Sheet for Patients: BloggerCourse.com  Fact Sheet for Healthcare Providers: SeriousBroker.it  This test is not yet approved or cleared by the [place] FDA and has been authorized for detection and/or diagnosis of SARS-CoV-2 by FDA under an Emergency Use Authorization (EUA). This EUA will remain in effect (meaning this test can be used) for the duration of the COVID-19 declaration under Section 564(b)(1) of the Act, 21 U.S.C. section 360bbb-3(b)(1), unless the authorization is terminated or revoked.  Performed at [facility], [address], [place], [place] [address]      Labs: BNP (last 3 results) No results for input(s): BNP in the last 8760 hours. Basic Metabolic Panel: Recent Labs  Lab 01/23/21 0531 01/23/21 0535 01/23/21 0851 01/24/21 1105 01/26/21 0427 01/27/21 0600 01/27/21 1056 01/27/21 1057 01/27/21 1103 01/28/21 0334  NA 133* 135  --  133* 133* 132* 135 134* 134* 132*  K 4.1 4.1  --  3.9 4.0 3.8 3.8 3.8 3.8 3.6  CL 100 103  --  100 100 101  --   --   --  104  CO2 20*  --   --  23 21* 23  --   --   --  21*  GLUCOSE 116* 112*  --  112* 103* 91  --   --   --  99  BUN 14 16  --  14 20 20   --   --   --  20  CREATININE 1.09 1.00  --  1.20 1.28* 1.30*  --   --   --  1.19  CALCIUM 8.8*  --   --  9.1 8.8* 8.6*  --   --   --  8.3*  MG  --   --  1.5*  --  2.0  --   --   --   --   --   PHOS  --   --  3.2  --   --    --   --   --   --   --    Liver Function Tests: Recent Labs  Lab 01/23/21 0531  AST 34  ALT 18  ALKPHOS 71  BILITOT 0.3  PROT 7.7  ALBUMIN 3.6   No results for input(s): LIPASE, AMYLASE in the last 168 hours. No results for input(s): AMMONIA in the last 168 hours. CBC: Recent Labs  Lab 01/23/21 0531 01/23/21 0535 01/26/21 1931 01/27/21 1056 01/27/21 1057 01/27/21 1103 01/28/21 0334  WBC 3.3*  --  3.6*  --   --   --  4.1  NEUTROABS 2.1  --   --   --   --   --   --   HGB 14.5   < > 14.5 15.3 15.6 15.3 13.7  HCT 44.3   < > 44.2 45.0 46.0 45.0 41.5  MCV 79.0*  --  79.2*  --   --   --  78.2*  PLT 154  --  165  --   --   --  132*   < > = values in this interval not displayed.   Cardiac Enzymes: No results for input(s): CKTOTAL, CKMB, CKMBINDEX, TROPONINI in the last 168 hours. BNP: Invalid input(s): POCBNP CBG: Recent Labs  Lab 01/23/21 0530  GLUCAP 117*   D-Dimer No results for input(s): DDIMER in the last 72 hours. Hgb A1c No results for input(s): HGBA1C in the last 72 hours. Lipid Profile No results for input(s): CHOL, HDL, LDLCALC, TRIG, CHOLHDL, LDLDIRECT in the last 72 hours. Thyroid function studies No results for input(s): TSH, T4TOTAL, T3FREE, THYROIDAB in the last 72 hours.  Invalid input(s): FREET3 Anemia work up No results for input(s): VITAMINB12, FOLATE, FERRITIN, TIBC, IRON, RETICCTPCT in the last 72 hours. Urinalysis    Component Value Date/Time   COLORURINE STRAW (A) 01/23/2021 0700   APPEARANCEUR CLEAR 01/23/2021 0700   LABSPEC 1.025 01/23/2021 0700   PHURINE 6.0 01/23/2021 0700   GLUCOSEU NEGATIVE 01/23/2021 0700   HGBUR MODERATE (A) 01/23/2021 0700   BILIRUBINUR NEGATIVE 01/23/2021 0700   KETONESUR NEGATIVE 01/23/2021 0700   PROTEINUR NEGATIVE 01/23/2021 0700   NITRITE NEGATIVE 01/23/2021 0700   LEUKOCYTESUR NEGATIVE 01/23/2021 0700   Sepsis Labs Invalid input(s): PROCALCITONIN,  WBC,  LACTICIDVEN Microbiology Recent Results (from  the past 240 hour(s))  Resp Panel by RT-PCR (Flu A&B, Covid) Nasopharyngeal Swab     Status: None   Collection Time: 01/23/21  5:31 AM   Specimen: Nasopharyngeal Swab; Nasopharyngeal(NP) swabs in vial transport medium  Result Value Ref Range Status   SARS Coronavirus 2 by RT PCR NEGATIVE NEGATIVE Final  Comment: (NOTE) SARS-CoV-2 target nucleic acids are NOT DETECTED.  The SARS-CoV-2 RNA is generally detectable in upper respiratory specimens during the acute phase of infection. The lowest concentration of SARS-CoV-2 viral copies this assay can detect is 138 copies/mL. A negative result does not preclude SARS-Cov-2 infection and should not be used as the sole basis for treatment or other patient management decisions. A negative result may occur with  improper specimen collection/handling, submission of specimen other than nasopharyngeal swab, presence of viral mutation(s) within the areas targeted by this assay, and inadequate number of viral copies(<138 copies/mL). A negative result must be combined with clinical observations, patient history, and epidemiological information. The expected result is Negative.  Fact Sheet for Patients:  BloggerCourse.com  Fact Sheet for Healthcare Providers:  SeriousBroker.it  This test is no t yet approved or cleared by the [place] FDA and  has been authorized for detection and/or diagnosis of SARS-CoV-2 by FDA under an Emergency Use Authorization (EUA). This EUA will remain  in effect (meaning this test can be used) for the duration of the COVID-19 declaration under Section 564(b)(1) of the Act, 21 U.S.C.section 360bbb-3(b)(1), unless the authorization is terminated  or revoked sooner.       Influenza A by PCR NEGATIVE NEGATIVE Final   Influenza B by PCR NEGATIVE NEGATIVE Final    Comment: (NOTE) The Xpert Xpress SARS-CoV-2/FLU/RSV plus assay is intended as an aid in the diagnosis of  influenza from Nasopharyngeal swab specimens and should not be used as a sole basis for treatment. Nasal washings and aspirates are unacceptable for Xpert Xpress SARS-CoV-2/FLU/RSV testing.  Fact Sheet for Patients: BloggerCourse.com  Fact Sheet for Healthcare Providers: SeriousBroker.it  This test is not yet approved or cleared by the [place] FDA and has been authorized for detection and/or diagnosis of SARS-CoV-2 by FDA under an Emergency Use Authorization (EUA). This EUA will remain in effect (meaning this test can be used) for the duration of the COVID-19 declaration under Section 564(b)(1) of the Act, 21 U.S.C. section 360bbb-3(b)(1), unless the authorization is terminated or revoked.  Performed at [facility], [address], [place], [place] [address]      Time coordinating discharge: 25 minutes    30 Day Unplanned Readmission Risk Score   Flowsheet Row ED to Hosp-Admission (Discharged) from 01/23/2021 in [facility]  30 Day Unplanned Readmission Risk Score (%) 11.85 Filed at 01/28/2021 1200     This score is the patient's risk of an unplanned readmission within 30 days of being discharged (0 -100%). The score is based on dignosis, age, lab data, medications, orders, and past utilization.   Low:  0-14.9   Medium: 15-21.9   High: 22-29.9   Extreme: 30 and above           SIGNED:   Alberteen Sam, MD  [facility] 01/28/2021, 8:37 PM

## 2021-01-28 NOTE — Plan of Care (Signed)
  Problem: Education: Goal: Knowledge of patient specific risk factors addressed and post discharge goals established will improve Outcome: Adequate for Discharge Goal: Individualized Educational Video(s) Outcome: Adequate for Discharge   Problem: Education: Goal: Knowledge of General Education information will improve Description: Including pain rating scale, medication(s)/side effects and non-pharmacologic comfort measures Outcome: Adequate for Discharge   Problem: Health Behavior/Discharge Planning: Goal: Ability to manage health-related needs will improve Outcome: Adequate for Discharge   Problem: Clinical Measurements: Goal: Ability to maintain clinical measurements within normal limits will improve Outcome: Adequate for Discharge Goal: Will remain free from infection Outcome: Adequate for Discharge Goal: Diagnostic test results will improve Outcome: Adequate for Discharge Goal: Respiratory complications will improve Outcome: Adequate for Discharge Goal: Cardiovascular complication will be avoided Outcome: Adequate for Discharge   Problem: Activity: Goal: Risk for activity intolerance will decrease Outcome: Adequate for Discharge   Problem: Nutrition: Goal: Adequate nutrition will be maintained Outcome: Adequate for Discharge   Problem: Coping: Goal: Level of anxiety will decrease Outcome: Adequate for Discharge   Problem: Elimination: Goal: Will not experience complications related to bowel motility Outcome: Adequate for Discharge Goal: Will not experience complications related to urinary retention Outcome: Adequate for Discharge   Problem: Pain Managment: Goal: General experience of comfort will improve Outcome: Adequate for Discharge   Problem: Safety: Goal: Ability to remain free from injury will improve Outcome: Adequate for Discharge   Problem: Skin Integrity: Goal: Risk for impaired skin integrity will decrease Outcome: Adequate for Discharge

## 2021-01-28 NOTE — Discharge Instructions (Addendum)
Cognitive Rehabilitation After a Stroke After a stroke, you may have various problems with thinking (cognitive disability). The types of problems you have will depend on how severe the stroke was and where it was located in the brain. Problems may include:  Problems with short-term memory.  Trouble paying attention.  Trouble communicating or understanding language (aphasia).  A drop in mental ability that may interfere with daily life (dementia).  Trouble with problem-solving and information processing.  Problems with reading, writing, or math.  Problems with your ability to plan and to perform activities in sequence (executive function). These problems can feel overwhelming. However, with rehabilitation and time to heal, many people have improvement in their symptoms. What causes cognitive disability? A stroke happens when blood cannot flow to certain areas of the brain. When this happens, brain cells die in the affected areas because they cannot get oxygen and nutrients from the blood. Cognitive disability is caused by the death of cells in the areas of the brain that control thinking. What is cognitive rehabilitation? Cognitive rehabilitation is a program to help you improve your thinking skills after a stroke. Rehabilitation cannot completely reverse the effects of a stroke, but it can help you with memory, problem-solving, and communication skills. Therapy focuses on:  Improving brain function. This may involve activities such as learning to break down tasks into simple steps.  Helping you learn ways to cope with thinking problems. For example, you might learn memory tricks or do activities that stimulate memory, such as naming objects or describing pictures. Cognitive rehabilitation may include:  Speech-language therapy to help you understand and use language to communicate.  Occupational therapy to help you perform daily activities.  Music therapy to help relieve stress,  anxiety, and depression. This may involve listening to music, singing, or playing instruments.  Physical therapy to help improve your ability to move and perform actions that involve the muscles (motor functions).   When will therapy start and where will I have therapy? Your health care provider will decide when it is best for you to start therapy. In some cases, people start rehabilitation as soon as their health is stable, which may be 24-48 hours after the stroke. Rehabilitation can take place in a few different places, based on your needs. It may take place in:  The hospital or an in-patient rehabilitation hospital.  An outpatient rehabilitation facility.  A long-term care facility.  A community rehabilitation clinic.  Your home. What are some tools to help after a stroke? There are a number of tools and apps that you can use on your smartphone, personal computer, or tablet to help improve brain function. Some of these apps include:  Calendar reminders or alarm apps to help with memory.  Note-taking or sketch pad apps to help with memory or communication.  Text-to-speech apps that allow you to listen to what you are reading, which helps your ability to understanding text.  Picture dictionary or picture message apps to help with communication.  E-readers. These can highlight text as it is read aloud, which helps with listening and reading skills. How can my friends or family help during my rehabilitation? During your recovery, it is important that your friends and family members help you work toward more independence. Your caregivers should speak with your health care providers to learn how they can best help you during recovery. This may include working on speech-language or memory exercises at home, or helping with daily tasks and errands. If you have cognitive  disability, you may be at risk for injury or accidents at home, such as forgetting to turn off the stove. Friends and  family members can help ensure home safety by taking steps such as getting appliances with automatic shut-off features or storing dangerous objects in a secure place. What else should I know about cognitive rehabilitation after a stroke? Having trouble with memory and problem-solving can make you feel alone. You may also have mood changes, anxiety, or depression after a stroke. It is important to:  Stay connected with others through social groups, online support groups, or your community.  Talk to your friends, family, and caregivers about any emotional problems you are having.  Go to one-on-one or group therapy as suggested by your health care provider.  Stay physically active and exercise as often as suggested by your health care provider. Summary  After a stroke, some people have problems with thinking that affect attention, memory, language, communication, and problem-solving.  Cognitive rehabilitation is a program to help you regain brain function and learn skills to cope with thinking problems.  Rehabilitation cannot completely reverse the effects of a stroke, but it can help to improve quality of life.  Cognitive rehabilitation may include speech-language therapy, occupational therapy, music therapy, and physical therapy. This information is not intended to replace advice given to you by your health care provider. Make sure you discuss any questions you have with your health care provider. Document Revised: 01/23/2019 Document Reviewed: 01/06/2017 Elsevier Patient Education  2021 [employer].   Driving After a Stroke Driving can be dangerous after a stroke because a stroke can cause physical, emotional, cognitive, and behavioral changes. Damage to your brain and other parts of your nervous system may affect your ability to drive. You may have weakness, stiffness, and pain, and have problems moving, talking, seeing, touching, or problem-solving. A stroke can also cause inability to  move (paralysis) on one side of your body. Can I return to driving? Ask your health care provider when it is safe for you to drive. Laws on driving after a stroke vary by state. Your health care provider may recommend that you:  Get a driving evaluation to have your vision, thinking, reaction time, and driving skills tested.  Take a driving rehabilitation program for people who have had a stroke.  Take a driving class or a retraining program.   How is driving affected by a stroke? A family member may be the first to notice that it is not safe for you to drive. You may have problems with:  Your vision.  Talking and communicating.  Weakness, pain, and stiffness in your arms or legs.  Responding to changes on the road.  Using the steering wheel, pedals, and other parts of the car.  Thinking while driving.  Judgment on the road. What are some signs that it may not be safe for me to drive? Signs that driving may be unsafe for you include:  Driving too fast or too slowly.  Needing help from others while driving.  Not paying attention to street signs or signals.  Making bad decisions while driving.  Not keeping enough distance between cars.  Drifting into other lanes.  Becoming confused, angry, or frustrated.  Getting lost in familiar places.  Having accidents while driving. What is adaptive equipment? Adaptive equipment refers to devices that can help people who have had a stroke to drive and do other activities. You may need:  A wheelchair-accessible car.  Special hand controls in the  car.  Pedal extensions for the car.  A seat base to help you stay positioned in your seat.  Lifts and ramps to help you get in and out of the car. Summary  Damage to your brain and other parts of your nervous system may affect your ability to drive.  Ask your health care provider when it is safe for you to drive again. You may need to take steps such as getting a driving  evaluation or taking a driving class.  A family member may be the first to notice that it is not safe for you to drive.  You may need adaptive equipment to drive safely. This information is not intended to replace advice given to you by your health care provider. Make sure you discuss any questions you have with your health care provider. Document Revised: 09/15/2017 Document Reviewed: 01/09/2017 Elsevier Patient Education  2021 [employer]  Radial Site Care  This sheet gives you information about how to care for yourself after your procedure. Your health care provider may also give you more specific instructions. If you have problems or questions, contact your health care provider. What can I expect after the procedure? After the procedure, it is common to have:  Bruising and tenderness at the catheter insertion area. Follow these instructions at home: Medicines  Take over-the-counter and prescription medicines only as told by your health care provider. Insertion site care  Follow instructions from your health care provider about how to take care of your insertion site. Make sure you: ? Wash your hands with soap and water before you change your bandage (dressing). If soap and water are not available, use hand sanitizer. ? Change your dressing as told by your health care provider. ? Leave stitches (sutures), skin glue, or adhesive strips in place. These skin closures may need to stay in place for 2 weeks or longer. If adhesive strip edges start to loosen and curl up, you may trim the loose edges. Do not remove adhesive strips completely unless your health care provider tells you to do that.  Check your insertion site every day for signs of infection. Check for: ? Redness, swelling, or pain. ? Fluid or blood. ? Pus or a bad smell. ? Warmth.  Do not take baths, swim, or use a hot tub until your health care provider approves.  You may shower 24-48 hours after the procedure, or as  directed by your health care provider. ? Remove the dressing and gently wash the site with plain soap and water. ? Pat the area dry with a clean towel. ? Do not rub the site. That could cause bleeding.  Do not apply powder or lotion to the site. Activity  For 24 hours after the procedure, or as directed by your health care provider: ? Do not flex or bend the affected arm. ? Do not push or pull heavy objects with the affected arm. ? Do not drive yourself home from the hospital or clinic. You may drive 24 hours after the procedure unless your health care provider tells you not to. ? Do not operate machinery or power tools.  Do not lift anything that is heavier than 10 lb (4.5 kg), or the limit that you are told, until your health care provider says that it is safe.  Ask your health care provider when it is okay to: ? Return to work or school. ? Resume usual physical activities or sports. ? Resume sexual activity.   General instructions  If  the catheter site starts to bleed, raise your arm and put firm pressure on the site. If the bleeding does not stop, get help right away. This is a medical emergency.  If you went home on the same day as your procedure, a responsible adult should be with you for the first 24 hours after you arrive home.  Keep all follow-up visits as told by your health care provider. This is important. Contact a health care provider if:  You have a fever.  You have redness, swelling, or yellow drainage around your insertion site. Get help right away if:  You have unusual pain at the radial site.  The catheter insertion area swells very fast.  The insertion area is bleeding, and the bleeding does not stop when you hold steady pressure on the area.  Your arm or hand becomes pale, cool, tingly, or numb. These symptoms may represent a serious problem that is an emergency. Do not wait to see if the symptoms will go away. Get medical help right away. Call your local  emergency services (911 in the [place] Do not drive yourself to the hospital. Summary  After the procedure, it is common to have bruising and tenderness at the site.  Follow instructions from your health care provider about how to take care of your radial site wound. Check the wound every day for signs of infection.  Do not lift anything that is heavier than 10 lb (4.5 kg), or the limit that you are told, until your health care provider says that it is safe. This information is not intended to replace advice given to you by your health care provider. Make sure you discuss any questions you have with your health care provider. Document Revised: 11/08/2017 Document Reviewed: 11/08/2017 Elsevier Patient Education  2021 [employer] Inc[employer] Information on my medicine - ELIQUIS (apixaban)  Why was Eliquis prescribed for you? Eliquis was prescribed to treat blood clot found in the heart  What do You need to know about Eliquis ? The dose ONE 5 mg tablet taken TWICE daily.  Eliquis may be taken with or without food.   Try to take the dose about the same time in the morning and in the evening. If you have difficulty swallowing the tablet whole please discuss with your pharmacist how to take the medication safely.  Take Eliquis exactly as prescribed and DO NOT stop taking Eliquis without talking to the doctor who prescribed the medication.  Stopping may increase your risk of developing a new blood clot.  Refill your prescription before you run out.  After discharge, you should have regular check-up appointments with your healthcare provider that is prescribing your Eliquis.    What do you do if you miss a dose? If a dose of ELIQUIS is not taken at the scheduled time, take it as soon as possible on the same day and twice-daily administration should be resumed. The dose should not be doubled to make up for a missed dose.  Important Safety Information A possible side effect of Eliquis is bleeding.  You should call your healthcare provider right away if you experience any of the following: ? Bleeding from an injury or your nose that does not stop. ? Unusual colored urine (red or dark brown) or unusual colored stools (red or black). ? Unusual bruising for unknown reasons. ? A serious fall or if you hit your head (even if there is no bleeding).  Some medicines may interact with Eliquis and might increase your risk  of bleeding or clotting while on Eliquis. To help avoid this, consult your healthcare provider or pharmacist prior to using any new prescription or non-prescription medications, including herbals, vitamins, non-steroidal anti-inflammatory drugs (NSAIDs) and supplements.  This website has more information on Eliquis (apixaban): http://[email or web address].com/eliquis/home

## 2021-01-28 NOTE — Progress Notes (Signed)
Pt is alert and oriented. Discharge instructions/ AVS given to pt. 

## 2021-01-28 NOTE — Progress Notes (Addendum)
Heart Failure Stewardship Pharmacist Progress Note   PCP: Pcp, No PCP-Cardiologist: [name], MD    HPI:  60 yo M with no significant PMH. 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 An ECHO was done on 01/23/21 and LVEF was 25-30%. ECHO repeated on 01/26/21 and LVEF <20% with evidence of apical thrombus. 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 Plan for CABG workup after 3 months of anticoagulation for LV thrombus.  Discharge HF Medications: Carvedilol 6.25 mg BID Entresto 24/26 mg BID  Prior to admission HF Medications: None   Pertinent Lab Values: . Serum creatinine 1.19, BUN 20, Potassium 3.6, Sodium 132  Vital Signs: . Weight: 132 lbs (admission weight: 133 lbs) . Blood pressure: 140/90s   . Heart rate: 90s  Medication Assistance / Insurance Benefits Check: Does the patient have prescription insurance?  No  Outpatient Pharmacy:  Prior to admission outpatient pharmacy: [facility] Is the patient willing to use [facility] TOC pharmacy at discharge? Yes Is the patient willing to transition their outpatient pharmacy to utilize a [facility] outpatient pharmacy?   Pending    Assessment: 1. 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 NYHA class II symptoms. - Continue carvedilol 6.25 mg BID - Continue Entresto 24/26 mg BID - Consider starting spironolactone 12.5 mg daily  - Consider starting [name] 10 mg daily    Plan: 1) Medication changes recommended at this time: - Add spironolactone 12.5 mg daily today  2) Patient assistance: - No insurance  - Will consult HF CSW - Can enroll in patient assistance for Entresto + [name] - will complete during TOC follow up appt if pt is agreeable to coming. If unable to schedule TOC follow up, will plan to submit paperwork prior to discharge.   Sharen Hones, PharmD, BCPS Heart Failure Stewardship Pharmacist Phone [phone]

## 2021-01-28 NOTE — Progress Notes (Signed)
Progress Note  Patient Name: Todd Vega Date of Encounter: 01/28/2021  [facility] Cardiologist: [name], MD   Subjective   No chest pain or dyspnea  Inpatient Medications    Scheduled Meds: . apixaban  5 mg Oral BID  . aspirin EC  81 mg Oral Daily  . atorvastatin  20 mg Oral Daily  . carvedilol  6.25 mg Oral BID WC  . folic acid  1 mg Oral Daily  . loratadine  10 mg Oral Daily  . multivitamin with minerals  1 tablet Oral Daily  . sacubitril-valsartan  1 tablet Oral BID  . thiamine  100 mg Oral Daily   Continuous Infusions:  PRN Meds: acetaminophen, ondansetron (ZOFRAN) IV, oxyCODONE   Vital Signs    Vitals:   01/28/21 0012 01/28/21 0501 01/28/21 0956 01/28/21 1023  BP: 117/87 118/83 (!) 137/97 (!) 137/97  Pulse: 80 88 90 90  Resp:  14  16  Temp: 98.3 F (36.8 C) 97.8 F (36.6 C)  98.5 F (36.9 C)  TempSrc: Oral Oral  Oral  SpO2: 99% 98%  97%  Weight:  59.9 kg    Height:        Intake/Output Summary (Last 24 hours) at 01/28/2021 1046 Last data filed at 01/28/2021 0900 Gross per 24 hour  Intake 1967.06 ml  Output --  Net 1967.06 ml   Last 3 Weights 01/28/2021 01/27/2021 01/27/2021  Weight (lbs) 132 lb 0.9 oz 124 lb 3.2 oz 124 lb  Weight (kg) 59.9 kg 56.337 kg 56.246 kg      Telemetry    Sinus- Personally Reviewed  ECG    No new tracing   Physical Exam    General: Well developed, well nourished, NAD HEENT: OP clear, mucus membranes moist  SKIN: warm, dry. No rashes. Neuro: No focal deficits, expressive aphasia Musculoskeletal: Muscle strength 5/5 all ext  Psychiatric: Mood and affect normal  Neck: No JVD, no carotid bruits, no thyromegaly, no lymphadenopathy.  Lungs:Clear bilaterally, no wheezes, rhonci, crackles Cardiovascular: Regular rate and rhythm. No murmurs, gallops or rubs. Abdomen:Soft. Bowel sounds present. Non-tender.  Extremities: No lower extremity edema.     Labs    High Sensitivity Troponin:  No results for  input(s): TROPONINIHS in the last 720 hours.    Chemistry Recent Labs  Lab 01/23/21 0531 01/23/21 0535 01/26/21 0427 01/27/21 0600 01/27/21 1056 01/27/21 1057 01/27/21 1103 01/28/21 0334  NA 133*   < > 133* 132*   < > 134* 134* 132*  K 4.1   < > 4.0 3.8   < > 3.8 3.8 3.6  CL 100   < > 100 101  --   --   --  104  CO2 20*   < > 21* 23  --   --   --  21*  GLUCOSE 116*   < > 103* 91  --   --   --  99  BUN 14   < > 20 20  --   --   --  20  CREATININE 1.09   < > 1.28* 1.30*  --   --   --  1.19  CALCIUM 8.8*   < > 8.8* 8.6*  --   --   --  8.3*  PROT 7.7  --   --   --   --   --   --   --   ALBUMIN 3.6  --   --   --   --   --   --   --  AST 34  --   --   --   --   --   --   --   ALT 18  --   --   --   --   --   --   --   ALKPHOS 71  --   --   --   --   --   --   --   BILITOT 0.3  --   --   --   --   --   --   --   GFRNONAA >60   < > >60 >60  --   --   --  >60  ANIONGAP 13   < > 12 8  --   --   --  7   < > = values in this interval not displayed.     Hematology Recent Labs  Lab 01/23/21 0531 01/23/21 0535 01/26/21 1931 01/27/21 1056 01/27/21 1057 01/27/21 1103 01/28/21 0334  WBC 3.3*  --  3.6*  --   --   --  4.1  RBC 5.61  --  5.58  --   --   --  5.31  HGB 14.5   < > 14.5   < > 15.6 15.3 13.7  HCT 44.3   < > 44.2   < > 46.0 45.0 41.5  MCV 79.0*  --  79.2*  --   --   --  78.2*  MCH 25.8*  --  26.0  --   --   --  25.8*  MCHC 32.7  --  32.8  --   --   --  33.0  RDW 14.8  --  14.1  --   --   --  13.9  PLT 154  --  165  --   --   --  132*   < > = values in this interval not displayed.    BNPNo results for input(s): BNP, PROBNP in the last 168 hours.   DDimer No results for input(s): DDIMER in the last 168 hours.   Radiology    CARDIAC CATHETERIZATION  Result Date: 01/27/2021  Bulky calcified ulcerated distal left main 50%.  Ostial LAD 85%, calcified.  First diagonal is large and contains 70 to 80% ostial narrowing and 90% stenosis of the smaller branch.  Ostial  ramus 70 to 80%, calcified.  Ostial circumflex 50 to 70%, and calcified.  Proximal to mid circumflex 90%.  RCA contains eccentric bulky 30 to 40% stenosis.  Right heart pressures were normal.  Mean capillary wedge pressure 5 mmHg.  Mean pulmonary artery pressure 9 mmHg. RECOMMENDATIONS:  Consider revascularization options.  Consider viability study.  Coronary anatomy is surgical.  Would be difficult to perform PCI and not have ischemic complications.  ECHOCARDIOGRAM LIMITED  Result Date: 01/26/2021    ECHOCARDIOGRAM LIMITED REPORT   Patient Name:   Todd Vega Date of Exam: 01/26/2021 Medical Rec #:  [id number]     Height: Accession #:    [id number]    Weight:       133.2 lb Date of Birth:  [birth year]      BSA:          1.540 m Patient Age:    60 years      BP:           142/93 mmHg Patient Gender: M             HR:  78 bpm. Exam Location:  Inpatient Procedure: Limited Echo and Intracardiac Opacification Agent Indications:    Rule out LV thrombus; I50.40* Unspecified combined systolic                 (congestive) and diastolic (congestive) heart failure  History:        Patient has prior history of Echocardiogram examinations, most                 recent 01/25/2021. CHF; Stroke. ETOH.  Sonographer:    Sheralyn Boatman RDCS Referring Phys: [id number] Shaylan Tutton D Asyria Kolander IMPRESSIONS  1. Left ventricular ejection fraction, by estimation, is <20%. The left ventricle has severely decreased function. The left ventricle demonstrates regional wall motion abnormalities (see scoring diagram/findings for description). The left ventricular internal cavity size was severely dilated. There is severe akinesis of the left ventricular, apical apical segment.  2. Right ventricular systolic function is normal. The right ventricular size is normal.  3. The aortic valve is grossly normal. Aortic valve regurgitation is not visualized. No aortic stenosis is present. FINDINGS  Left Ventricle: Left ventricular ejection fraction, by  estimation, is <20%. The left ventricle has severely decreased function. The left ventricle demonstrates regional wall motion abnormalities. Severe akinesis of the left ventricular, apical apical segment. Definity contrast agent was given IV to delineate the left ventricular endocardial borders. The left ventricular internal cavity size was severely dilated. Right Ventricle: The right ventricular size is normal. Right vetricular wall thickness was not well visualized. Right ventricular systolic function is normal. Aortic Valve: The aortic valve is grossly normal. Aortic valve regurgitation is not visualized. No aortic stenosis is present. LEFT VENTRICLE PLAX 2D LVIDd:         6.20 cm LVIDs:         6.00 cm LV PW:         1.90 cm LV IVS:        1.50 cm  LV Volumes (MOD) LV vol d, MOD A2C: 160.0 ml LV vol d, MOD A4C: 232.0 ml LV vol s, MOD A2C: 149.0 ml LV vol s, MOD A4C: 139.0 ml LV SV MOD A2C:     11.0 ml LV SV MOD A4C:     232.0 ml LV SV MOD BP:      57.7 ml LEFT ATRIUM         Index LA diam:    2.40 cm 1.56 cm/m   AORTA Ao Root diam: 3.80 cm [name] MD Electronically signed by [name] MD Signature Date/Time: 01/26/2021/4:40:52 PM    Final     Cardiac Studies   Echo: 01/23/21  IMPRESSIONS    1. Left ventricular ejection fraction, by estimation, is 25 to 30%. The  left ventricle has severely decreased function. The left ventricle  demonstrates global hypokinesis. The left ventricular internal cavity size  was severely dilated. There is mild  asymmetric left ventricular hypertrophy of the basal and septal segments.  Left ventricular diastolic parameters are consistent with Grade I  diastolic dysfunction (impaired relaxation).  2. Right ventricular systolic function is normal. The right ventricular  size is normal.  3. Left atrial size was moderately dilated.  4. The mitral valve is normal in structure. Mild mitral valve  regurgitation. No evidence of mitral stenosis.  5. The  aortic valve is tricuspid. There is mild calcification of the  aortic valve. Aortic valve regurgitation is not visualized. Mild aortic  valve sclerosis is present, with no evidence of aortic valve stenosis.  6. Aortic dilatation noted. There is mild dilatation of the aortic root,  measuring 40 mm.  7. The inferior vena cava is normal in size with greater than 50%  respiratory variability, suggesting right atrial pressure of 3 mmHg.   TEE 01/25/21:  1. Left ventricular ejection fraction, by estimation, is 20 to 25%. The  left ventricle has severely decreased function. The left ventricle  demonstrates global hypokinesis. The left ventricular internal cavity size  was severely dilated.  2. Right ventricular systolic function is normal. The right ventricular  size is normal.  3. Left atrial size was moderately dilated. No left atrial/left atrial  appendage thrombus was detected.  4. Right atrial size was mildly dilated.  5. The mitral valve is normal in structure. Mild mitral valve  regurgitation.  6. The aortic valve is tricuspid. Aortic valve regurgitation is not  visualized. No aortic stenosis is present.  7. Agitated saline contrast bubble study was negative, with no evidence  of any interatrial shunt.   Conclusion(s)/Recommendation(s): No LA/LAA thrombus identified. Negative  bubble study for interatrial shunt. No intracardiac source of embolism  detected on this on this transesophageal echocardiogram. Severely reduced  LVEF.   Patient Profile     60 y.o. male with no significant PMH who was seen for the evaluation of new cardiomyopathy following an acute CVA. Echo 01/26/21 with evidence of apical thrombus. Cardiac cath 01/27/21 with severe double vessel CAD.   Assessment & Plan    1. 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 Severe LAD and Ramus disease on cath. Not favorable for PCI. He was seen by CT surgery yesterday and  surgery seems like a reasonable option following 3 months of anti-coagulation for LV thrombus.  -Will continue Coreg and Entresto -Add Aldactone today -Consider viability with cardiac MRI in 3 months  2. LV apical thrombus: Will continue Eliquis for 3 months before reimaging for thrombus.   3. CAD without angina: He is found to have severe disease in the ostium of the LAD and Ramus intermediate branch. He has been seen by CT surgery and planning for possible bypass surgery after 3 months of anti-coagulation. For now, continue ASA, statin and beta blocker.   4. CVA: Pt admitted with a likely embolic stroke. It appears by his limited echo with Definity that he has heavy smoke in his LV apex and early thrombus formation there. It is likely that his stroke was embolic from a LV thrombus. He is now on Eliquis.    5. HTN: BP tolerating addition of Entresto.  6. HLD: Continue statin  He will follow up with Dr. Elease HashimotoNahser in our office.   For questions or updates, please contact [facility] Please consult [email or web address] for contact info under        Signed, Verne Carrowhristopher Samreet Edenfield, MD  01/28/2021, 10:46 AM

## 2021-02-01 NOTE — Progress Notes (Signed)
[facility]  PCP: Gwinda Passe Primary Cardiologist: [name]  HPI:  Todd Vega is a 60 y.o.  male  with a PMH significant for CVA, LV thrombus, HTN, CAD, Combined Systolic and Diastolic CHF  No real sig PMH before 01/2021 when he woke up in the middle of the night with aphasia and R facial droop.  Code Stroke was called and he was found to have acute L MCA CVA.  He was outside of the TPA window.  His workup was consistent with an embolic stroke likely from an LV thrombus. Had TTE and TEE during admission which revealed Severely reduced EF <20%, RWMA, early forming LV thrombus, no real valvular disease, G1DD.  R/LHC results with normal filling pressures and coronary anatomy below:   He was started on GDMT and eliquis for LV thrombus with close follow up.    Strong family history of CAD, CVA, HTN, he is a never smoker.  Minimal alcohol use history.    Doing better since hospitalization, speech continues to improve, ambulating okay with a cane.  Denies any chest pain, shortness of breath.  Snores at night.  Sleeping well.  Walking around [employer] not getting short of breath.  Checking his bp and weight at home.  Eating more now because he feels better, not struggling with depression as much since living with his sister.   ROS: All systems negative except as listed in HPI, PMH and Problem List.  SH:  Social History   Socioeconomic History  . Marital status: Divorced    Spouse name: Not on file  . Number of children: 0  . Years of education: Not on file  . Highest education level: Some college, no degree  Occupational History  . Not on file  Tobacco Use  . Smoking status: Never Smoker  . Smokeless tobacco: Never Used  Vaping Use  . Vaping Use: Never used  Substance and Sexual Activity  . Alcohol use: Yes    Alcohol/week: 6.0 standard drinks    Types: 6 Cans of beer per week  . Drug use: Never  . Sexual activity: Not on file   Other Topics Concern  . Not on file  Social History Narrative  . Not on file   Social Determinants of Health   Financial Resource Strain: Low Risk   . Difficulty of Paying Living Expenses: Not very hard  Food Insecurity: No Food Insecurity  . Worried About Programme researcher, broadcasting/film/video in the Last Year: Never true  . Ran Out of Food in the Last Year: Never true  Transportation Needs: No Transportation Needs  . Lack of Transportation (Medical): No  . Lack of Transportation (Non-Medical): No  Physical Activity: Not on file  Stress: Not on file  Social Connections: Not on file  Intimate Partner Violence: Not on file    FH: No family history on file.  Past Medical History:  Diagnosis Date  . Asthma due to seasonal allergies     Current Outpatient Medications  Medication Sig Dispense Refill  . apixaban (ELIQUIS) 5 MG TABS tablet Take 1 tablet (5 mg total) by mouth 2 (two) times daily. 60 tablet 0  . aspirin 81 MG EC tablet Take 1 tablet (81 mg total) by mouth daily. Swallow whole. 30 tablet 0  . atorvastatin (LIPITOR) 20 MG tablet Take 1 tablet (20 mg total) by mouth daily. 30 tablet 0  . carvedilol (COREG) 6.25 MG tablet Take 1 tablet (6.25 mg total)  by mouth 2 (two) times daily with a meal. 60 tablet 0  . fexofenadine (ALLEGRA) 180 MG tablet Take 180 mg by mouth as needed for allergies or rhinitis.    Marland Kitchen sacubitril-valsartan (ENTRESTO) 24-26 MG Take 1 tablet by mouth 2 (two) times daily. 60 tablet 0  . spironolactone (ALDACTONE) 25 MG tablet Take 0.5 tablets (12.5 mg total) by mouth daily. 15 tablet 1   No current facility-administered medications for this encounter.    Vitals:   02/02/21 1056  BP: 130/64  Pulse: 76  SpO2: 97%  Weight: 58.6 kg (129 lb 4 oz)    PHYSICAL EXAM: Cardiac: JVD flat, normal rate and rhythm, clear s1 and s2, no murmurs, rubs or gallops, no LE edema Pulmonary: CTAB, not in distress Abdominal: non distended abdomen, soft and nontender Psych: Alert,  conversant, in good spirits  ECG   NSR rate of 75, LAD, LVH with repolarization abnormalities  ASSESSMENT & PLAN: Combined Systolic and Diastolic CHF: Multivessel CAD -L/RHC with multivessel disease with good distal targets, normal filling pressures -ECHO with EF <20%, RWMA, early forming LV thrombus, no real valvular disease, G1DD -likely mixed ischemic and non ischemic cardiomyopathy, hx of poorly controlled HTN -NYHA Class II symptoms, euvolemic on exam, no s/s of angina -Currently taking spiro 12.5, carvedilol 6.25 BID, entresto 24/26, asa 81, eliquis 5 BID -Add farxiga 10 -consider sleep study -check bmp -continue eliquis for 3 months, cardiac MRI to assess viability and CT surgery to continue to evaluate for possible revascularization  LV Thrombus: -no bleeding -continue eliquis   Follow up with general cardiology and CT surgery

## 2021-02-02 ENCOUNTER — Ambulatory Visit (HOSPITAL_COMMUNITY)
Admit: 2021-02-02 | Discharge: 2021-02-02 | Disposition: A | Discharge status: Home / Self Care | Payer: Self-pay | Source: Ambulatory Visit | Attending: Internal Medicine | Admitting: Internal Medicine

## 2021-02-02 ENCOUNTER — Telehealth (HOSPITAL_COMMUNITY): Payer: Self-pay

## 2021-02-02 ENCOUNTER — Other Ambulatory Visit: Payer: Self-pay

## 2021-02-02 ENCOUNTER — Encounter (HOSPITAL_COMMUNITY): Payer: Self-pay

## 2021-02-02 ENCOUNTER — Telehealth: Payer: Self-pay | Admitting: Cardiovascular Disease

## 2021-02-02 VITALS — BP 130/64 | HR 76 | Wt 129.2 lb

## 2021-02-02 DIAGNOSIS — Z823 Family history of stroke: Secondary | ICD-10-CM | POA: Insufficient documentation

## 2021-02-02 DIAGNOSIS — Z8249 Family history of ischemic heart disease and other diseases of the circulatory system: Secondary | ICD-10-CM | POA: Insufficient documentation

## 2021-02-02 DIAGNOSIS — I2510 Atherosclerotic heart disease of native coronary artery without angina pectoris: Secondary | ICD-10-CM | POA: Insufficient documentation

## 2021-02-02 DIAGNOSIS — Z7982 Long term (current) use of aspirin: Secondary | ICD-10-CM | POA: Insufficient documentation

## 2021-02-02 DIAGNOSIS — I513 Intracardiac thrombosis, not elsewhere classified: Secondary | ICD-10-CM

## 2021-02-02 DIAGNOSIS — Z8673 Personal history of transient ischemic attack (TIA), and cerebral infarction without residual deficits: Secondary | ICD-10-CM | POA: Insufficient documentation

## 2021-02-02 DIAGNOSIS — Z7901 Long term (current) use of anticoagulants: Secondary | ICD-10-CM | POA: Insufficient documentation

## 2021-02-02 DIAGNOSIS — I110 Hypertensive heart disease with heart failure: Secondary | ICD-10-CM | POA: Insufficient documentation

## 2021-02-02 DIAGNOSIS — Z79899 Other long term (current) drug therapy: Secondary | ICD-10-CM | POA: Insufficient documentation

## 2021-02-02 DIAGNOSIS — I5042 Chronic combined systolic (congestive) and diastolic (congestive) heart failure: Secondary | ICD-10-CM

## 2021-02-02 LAB — BASIC METABOLIC PANEL
Anion gap: 4 — ABNORMAL LOW (ref 5–15)
BUN: 9 mg/dL (ref 6–20)
CO2: 28 mmol/L (ref 22–32)
Calcium: 9.4 mg/dL (ref 8.9–10.3)
Chloride: 103 mmol/L (ref 98–111)
Creatinine, Ser: 1.15 mg/dL (ref 0.61–1.24)
GFR, Estimated: >60 mL/min (ref >60)
Glucose, Bld: 123 mg/dL — ABNORMAL HIGH (ref 70–99)
Potassium: 5 mmol/L (ref 3.5–5.1)
Sodium: 135 mmol/L (ref 135–145)

## 2021-02-02 MED ORDER — DAPAGLIFLOZIN PROPANEDIOL 10 MG PO TABS: 10.0000 mg | ORAL_TABLET | Freq: Every day | ORAL | 0 refills | Status: DC

## 2021-02-02 NOTE — Telephone Encounter (Signed)
     Todd Vega PT is requesting order for OT and speech therapy for pt

## 2021-02-02 NOTE — Telephone Encounter (Signed)
RN returned call to Apolinar Junes, PT to advise that per Dr.Nahser neurology wanted the therapy consult. RN provided neurology contact information. Arman Bogus for calling.

## 2021-02-02 NOTE — Progress Notes (Signed)
Provided patient with [name] and [name] pt assist applications and advised to have Dr Elease Hashimoto complete provider section at his appointment on 5/6. Provided patient with [name] 30 day free card and samples of Eliquis and Entresto.  Medication Samples have been provided to the patient.  Drug name: Eliquis       Strength: 5mg         Qty: 4  LOT : [id number]  Exp.Date: 4/23, 8/23  Dosing instructions: take 1 tab Twice daily   The patient has been instructed regarding the correct time, dose, and frequency of taking this medication, including desired effects and most common side effects.   9/23 Todd Vega 11:53 AM 02/02/2021   Medication Samples have been provided to the patient.  Drug name: 02/04/2021       Strength: 24/26mg         Qty: 2  LOT: [name]  Exp.Date: 4/24  Dosing instructions: Take 1 tab Twice daily   The patient has been instructed regarding the correct time, dose, and frequency of taking this medication, including desired effects and most common side effects.   Todd Vega 11:54 AM 02/02/2021

## 2021-02-02 NOTE — Patient Instructions (Signed)
Start [name] 10 mg Daily, we have provided you a 30-day free card to take to the pharmacy  Your provider has prescribed [name] for you. Please be aware the most common side effect of this medication is urinary tract infections and yeast infections. Please practice good hygiene and keep this area clean and dry to help prevent this. If you do begin to have symptoms of these infections, such as difficulty urinating or painful urination,  please let us know.  We have provided you with Patient Assistance Applications for [name] and [name] to go along with your Eliquis application. Please take these to your appointment on 02/19/21 with Dr Elease Hashimoto to be completed.  We have provided you samples of Eliquis and Entresto so you will not run out while awaiting assistance approval  Labs done today, we will call you for abnormal results  Thank you for allowing [place] to provider your heart failure care after your recent hospitalization. Please follow-up with Dr Elease Hashimoto as scheduled on 02/19/2021  If you have any questions, issues, or concerns before your next appointment please call our office at [phone], opt. 2 and leave a message for the triage nurse.   Do the following things EVERYDAY: 1) Weigh yourself in the morning before breakfast. Write it down and keep it in a log. 2) Take your medicines as prescribed 3) Eat low salt foods--Limit salt (sodium) to 2000 mg per day.  4) Stay as active as you can everyday 5) Limit all fluids for the day to less than 2 liters  Limit your fluid intake to less than 2 liters of fluid per day. Fluid includes all drinks, coffee, juice, ice chips, soup, jello, and all other liquids.  Restrict your sodium intake to less than 2000mg  per day. This will help prevent your body from holding onto fluid. Read food labels as a lot of canned and packaged foods have a lot of sodium.

## 2021-02-02 NOTE — Telephone Encounter (Signed)
Heart Failure Nurse Navigator Progress Note  Call attempted to confirm HV TOC appt 11AM. HIPPA appropriate VM left with callback number.   Ozella Rocks, RN, BSN Heart Failure Nurse Navigator [phone]

## 2021-02-02 NOTE — Progress Notes (Signed)
Heart and Vascular Care Navigation  02/02/2021  Todd Vega [birth year] [id number]  Reason for Referral: Patient seen in TOC.   Engaged with patient face to face for initial visit for Heart and Vascular Care Coordination.                                                                                                   Assessment: Patient is a 60yo male who is single and lives with his sister in a single family home. Patient was working full time for [employer] and due to recent hospitalization is out on short term disability. Patient and his sister report that he has a pending medicaid application with the county.  He has patient assistance applications under way with the [facility] Pharmacist and states he currently has all his medications. He has home care coming to the house for PT, OT and speech next week. He has a PCP appointment coming up with [facility]. Patient is unsure at this time if he will have a permanent disability and hopeful to go back to work if possible.                              HRT/VAS Care Coordination     Patients Home Cardiology Office --  [facility]   Outpatient Care Team Social Worker   Social Worker Name: Lasandra Beech, [place] [phone]   Living arrangements for the past 2 months Single Family Home   Lives with: Relatives  Sister   Patient Current Insurance Coverage Medicaid Pending   Patient Has Concern With Paying Medical Bills Yes   Medical Bill Referrals: Medicaid application submitted to county   Does Patient Have Prescription Coverage? No  Applications started for Patient Assistance for medications   Home Assistive Devices/Equipment Eyeglasses   [facility]  [facility]   Current home services Home PT; Home OT; Other (comment)  Speech       Social History:                                                                             SDOH Screenings   Alcohol Screen: Low Risk   . Last Alcohol Screening Score (AUDIT): 3  Depression (PHQ2-9): Not on file   Financial Resource Strain: Low Risk   . Difficulty of Paying Living Expenses: Not very hard  Food Insecurity: No Food Insecurity  . Worried About Programme researcher, broadcasting/film/video in the Last Year: Never true  . Ran Out of Food in the Last Year: Never true  Housing: Low Risk   . Last Housing Risk Score: 0  Physical Activity: Not on file  Social Connections: Not on file  Stress: Not on file  Tobacco Use: Low Risk   . Smoking Tobacco  Use: Never Smoker  . Smokeless Tobacco Use: Never Used  Transportation Needs: No Transportation Needs  . Lack of Transportation (Medical): No  . Lack of Transportation (Non-Medical): No    SDOH Interventions: Financial Resources:  Financial Strain Interventions: Intervention Not Indicated Patient may need a referral to the [facility] for assistance with disability once determined the need.  Food Insecurity:  Food Insecurity Interventions: Intervention Not Indicated  Housing Insecurity:  Housing Interventions: Intervention Not Indicated  Transportation:   Transportation Interventions: Retail banker    Follow-up plan: CSW provided supportive intervention and discussed resources to assist with disability application in the near future if needed as well as other community resources. Patient and sister verbalize understanding and will follow up with CSW if needed. Lasandra Beech, LCSW, CCSW-MCS [phone]

## 2021-02-03 LAB — VITAMIN B1: Vitamin B1 (Thiamine): 70.2 nmol/L (ref 66.5–200.0)

## 2021-02-05 ENCOUNTER — Telehealth (INDEPENDENT_AMBULATORY_CARE_PROVIDER_SITE_OTHER): Payer: Self-pay

## 2021-02-05 ENCOUNTER — Telehealth (HOSPITAL_COMMUNITY): Payer: Self-pay | Admitting: Licensed Clinical Social Worker

## 2021-02-05 NOTE — Telephone Encounter (Signed)
Patient has difficulty talking. He did give permission to speak with sister. She is aware of B1 being normal. Maryjean Morn, CMA

## 2021-02-05 NOTE — Telephone Encounter (Signed)
CSW received phone call from pt sister requesting assistance with applying for disability for patient.  CSW assisted in completing referral for to [facility] over the phone with pt and pt sister.  They will plan to call CSW on Thursday next week when they are coming near the hospital for a cardiology appt.  Will plan to meet up with pt and sister then to obtain signature for release.  Burna Sis, LCSW Clinical Social Worker [facility] Desk#: [phone] Cell#: [phone]

## 2021-02-05 NOTE — Telephone Encounter (Signed)
-----   Message from [name], NP sent at 02/04/2021 10:55 AM EDT ----- Vitamin B is low end of normal.

## 2021-02-08 ENCOUNTER — Other Ambulatory Visit: Payer: Self-pay | Admitting: *Deleted

## 2021-02-08 NOTE — Patient Outreach (Signed)
[facility]) Care Management  02/08/2021  Todd Vega 02/08/1961 [id number]   RED ON EMMI ALERT - Stroke Day # 9 Date: 4/24 Red Alert Reason: Lost interest in things and Feeling sad/hopeless/anxious/empty  Incoming call received back from sister.  Identity verified.  This care manager introduced self and stated purpose of call.  [facility] care management services explained.  Member also present, report he lives with sister.  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  He remains independent in all ADL's.  Confirms that he does have speech therapy that has started.    Sister denies that member has any thoughts of hurting self, state his feelings are due to him not being able to effectively express himself verbally.  He has follow up appointment with PCP on 4/28, will discuss with provider at that time, anticipate antidepressants.  Denies any urgent concerns, encouraged to contact this care manager with questions.      Plan: RN CM will follow up with sister within the next 2 weeks.  Todd Vega, [name], MSN [facility] Care Management  [facility] Manager [phone]

## 2021-02-08 NOTE — Patient Outreach (Signed)
[facility]) Care Management  02/08/2021  Todd Vega Feb 09, 1961 [id number]   RED ON EMMI ALERT - Stroke Day # 9 Date: 4/24 Red Alert Reason: Lost interest in things and Feeling sad/hopeless/anxious/empty   Outreach attempt #1, unsuccessful, HIPAA complisnt voice message let.   Plan: RN CM will send outreach letter and follow up within the next 3-4 business days.  Kemper Durie, [name], MSN [facility] Care Management  [facility] Manager [phone]

## 2021-02-09 ENCOUNTER — Other Ambulatory Visit (HOSPITAL_COMMUNITY): Payer: Self-pay

## 2021-02-11 ENCOUNTER — Ambulatory Visit (INDEPENDENT_AMBULATORY_CARE_PROVIDER_SITE_OTHER): Payer: Self-pay | Admitting: Primary Care

## 2021-02-11 ENCOUNTER — Telehealth (HOSPITAL_COMMUNITY): Payer: Self-pay | Admitting: Licensed Clinical Social Worker

## 2021-02-11 ENCOUNTER — Other Ambulatory Visit: Payer: Self-pay

## 2021-02-11 ENCOUNTER — Encounter (INDEPENDENT_AMBULATORY_CARE_PROVIDER_SITE_OTHER): Payer: Self-pay | Admitting: Primary Care

## 2021-02-11 ENCOUNTER — Telehealth: Payer: Self-pay | Admitting: Cardiovascular Disease

## 2021-02-11 VITALS — BP 162/88 | HR 64 | Temp 97.3°F | Ht 65.0 in | Wt 134.0 lb

## 2021-02-11 DIAGNOSIS — Z09 Encounter for follow-up examination after completed treatment for conditions other than malignant neoplasm: Secondary | ICD-10-CM

## 2021-02-11 DIAGNOSIS — Z7689 Persons encountering health services in other specified circumstances: Secondary | ICD-10-CM

## 2021-02-11 DIAGNOSIS — I5043 Acute on chronic combined systolic (congestive) and diastolic (congestive) heart failure: Secondary | ICD-10-CM

## 2021-02-11 DIAGNOSIS — I639 Cerebral infarction, unspecified: Secondary | ICD-10-CM

## 2021-02-11 DIAGNOSIS — R4781 Slurred speech: Secondary | ICD-10-CM

## 2021-02-11 DIAGNOSIS — F331 Major depressive disorder, recurrent, moderate: Secondary | ICD-10-CM

## 2021-02-11 MED ORDER — SPIRONOLACTONE 25 MG PO TABS: 12.5000 mg | ORAL_TABLET | Freq: Every day | ORAL | 0 refills | Status: DC

## 2021-02-11 NOTE — Telephone Encounter (Signed)
Pt and pt wife came by clinic to sign [facility] referral to get assistance applying for long term disability.  Signed referral sent in to [facility] for review  Will continue to follow and assist as needed  Burna Sis, LCSW Clinical Social Worker [facility] Desk#: [phone] Cell#: [phone]

## 2021-02-11 NOTE — Telephone Encounter (Signed)
Pt c/o medication issue:  1. Name of Medication: spironolactone (ALDACTONE) 25 MG tablet  2. How are you currently taking this medication (dosage and times per day)? unknown  3. Are you having a reaction (difficulty breathing--STAT)? NO  4. What is your medication issue? PT sister Tomma Lightning is calling to make doctor that her brother is gaining weight and loosing weight.She also states that the medication spironolactone (ALDACTONE) 25 MG tablet that was prescribed she has been giving him the wrong dosage and is concerned on what needs to be done.She states he is all out of this medication.

## 2021-02-11 NOTE — Telephone Encounter (Signed)
RN spoke to patient and Todd Vega, sister who states that patient was out of his spironolactone from the hospital because the patient has been taking a whole tablet daily. Patient and sister misunderstood the dose. Patient has appointment scheduled for 5/6 with Dr. [name]. RN confirmed patient should only be taking 12.5mg  (1/2 tablet) daily. Sister verbalized understanding. RN to send in refill for medication. Sister request a 10-day supply due to pending medicaid at this time, and having to pay for medications out of pocket.  RN advised patient and sister to contact the office with any questions or concerns.

## 2021-02-11 NOTE — Patient Instructions (Signed)
Heart Failure, Diagnosis  Heart failure means that your heart is not able to pump blood in the right way. This makes it hard for your body to work well. Heart failure is usually a long-term (chronic) condition. You must take good care of yourself and follow your treatment plan from your doctor. What are the causes?  High blood pressure.  Buildup of cholesterol and fat in the arteries.  Heart attack. This injures the heart muscle.  Heart valves that do not open and close properly.  Damage of the heart muscle. This is also called cardiomyopathy.  Infection of the heart muscle. This is also called myocarditis.  Lung disease. What increases the risk?  Getting older. The risk of heart failure goes up as a person ages.  Being overweight.  Being male.  Use tobacco or nicotine products.  Abusing alcohol or drugs.  Having taken medicines that can damage the heart.  Having any of these conditions: ? Diabetes. ? Abnormal heart rhythms. ? Thyroid problems. ? Low blood counts (anemia).  Having a family history of heart failure. What are the signs or symptoms?  Shortness of breath.  Coughing.  Swelling of the feet, ankles, legs, or belly.  Losing or gaining weight for no reason.  Trouble breathing.  Waking from sleep because of the need to sit up and get more air.  Fast heartbeat.  Being very tired.  Feeling dizzy, or feeling like you may pass out (faint).  Having no desire to eat.  Feeling like you may vomit (nauseous).  Peeing (urinating) more at night.  Feeling confused. How is this treated? This condition may be treated with:  Medicines. These can be given to treat blood pressure and to make the heart muscles stronger.  Changes in your daily life. These may include: ? Eating a healthy diet. ? Staying at a healthy body weight. ? Quitting tobacco, alcohol, and drug use. ? Doing exercises. ? Participating in a cardiac rehabilitation program. This program  helps you improve your health through exercise, education, and counseling.  Surgery. Surgery can be done to open blocked valves, or to put devices in the heart, such as pacemakers.  A donor heart (heart transplant). You will receive a healthy heart from a donor. Follow these instructions at home:  Treat other conditions as told by your doctor. These may include high blood pressure, diabetes, thyroid disease, or abnormal heart rhythms.  Learn as much as you can about heart failure.  Get support as you need it.  Keep all follow-up visits. Summary  Heart failure means that your heart is not able to pump blood in the right way.  This condition is often caused by high blood pressure, heart attack, or damage of the heart muscle.  Symptoms of this condition include shortness of breath and swelling of the feet, ankles, legs, or belly. You may also feel very tired or feel like you may vomit.  You may be treated with medicines, surgery, or changes in your daily life.  Treat other health conditions as told by your doctor. This information is not intended to replace advice given to you by your health care provider. Make sure you discuss any questions you have with your health care provider. Document Revised: 04/25/2020 Document Reviewed: 04/25/2020 Elsevier Patient Education  2021 Elsevier Inc.  

## 2021-02-11 NOTE — Progress Notes (Signed)
HPI Mr. Bronte Kropf  60 y.o.male presents to the emergency room on 4/14/202 after waking up in the middle night and waking up he had aphasia and right-sided facial droop                                                          He presents today for follow up from the hospital. Admit date to the hospital was 01/23/21, patient was discharged from the hospital on 01/28/21, patient was admitted for: Acute CVA (cerebrovascular accident) [facility]), HTN (hypertension) and Combined systolic and diastolic heart failure (HCC).  He denies any headache dizziness increased weakness or aphasia.Marland Kitchen  He has given Tomma Lightning his sister permission to speak on his behalf and she also is his caregiver.   Past Medical History:  Diagnosis Date  . Asthma due to seasonal allergies      Allergies  Allergen Reactions  . Chocolate   . Coconut (Cocos Nucifera) Allergy Skin Test       Current Outpatient Medications on File Prior to Visit  Medication Sig Dispense Refill  . apixaban (ELIQUIS) 5 MG TABS tablet Take 1 tablet (5 mg total) by mouth 2 (two) times daily. 60 tablet 0  . aspirin 81 MG EC tablet Take 1 tablet (81 mg total) by mouth daily. Swallow whole. 30 tablet 0  . atorvastatin (LIPITOR) 20 MG tablet Take 1 tablet (20 mg total) by mouth daily. 30 tablet 0  . carvedilol (COREG) 6.25 MG tablet Take 1 tablet (6.25 mg total) by mouth 2 (two) times daily with a meal. 60 tablet 0  . dapagliflozin propanediol ([name]) 10 MG TABS tablet Take 1 tablet (10 mg total) by mouth daily before breakfast. 30 tablet 0  . fexofenadine (ALLEGRA) 180 MG tablet Take 180 mg by mouth as needed for allergies or rhinitis.    Marland Kitchen sacubitril-valsartan (ENTRESTO) 24-26 MG Take 1 tablet by mouth 2 (two) times daily. 60 tablet 0  . spironolactone (ALDACTONE) 25 MG tablet Take 0.5 tablets (12.5 mg total) by mouth daily. 15 tablet 1   No current facility-administered medications on file prior to visit.    ROS:  Review of Systems  Genitourinary:  Positive for frequency.  Psychiatric/Behavioral: Positive for depression.  All other systems reviewed and are negative.   Physical Exam: Filed Weights   02/11/21 0834  Weight: 134 lb (60.8 kg)   BP (!) 162/88 (BP Location: Right Arm, Patient Position: Sitting, Cuff Size: Normal)   Pulse 64   Temp (!) 97.3 F (36.3 C) (Temporal)   Ht 5\' 5"  (1.651 m)   Wt 134 lb (60.8 kg)   SpO2 95%   BMI 22.30 kg/m  General Appearance: thin , in no apparent distress. Eyes: PERRLA, EOMs, conjunctiva no swelling or erythema Sinuses: No Frontal/maxillary tenderness ENT/Mouth: Ext aud canals clear, TMs without erythema, bulging. Hearing normal.  Neck: Supple, thyroid normal.  Respiratory: Respiratory effort normal, BS equal bilaterally without rales, rhonchi, wheezing or stridor.  Cardio: RRR with no MRGs. Brisk peripheral pulses without edema.  Abdomen: Soft, + BS.  Non tender, no guarding, rebound, hernias, masses. Lymphatics: Non tender without lymphadenopathy.  Skin: Warm, dry without rashes, lesions, ecchymosis.  Psych: Awake and oriented X 3, normal affect, Insight and Judgment appropriate.    Delynn was seen today for hospitalization follow-up.  Diagnoses and all orders for this visit:  Encounter to establish care Encounter to establish care with new PCP  Cerebrovascular accident (CVA), unspecified mechanism (HCC) Acute CVA within the 28-day.Follow up with Neurology in 4-6 weeks  Slurred speech S/P CVA dysphasia.Follow up with Neurology in 4-6 weeks   Acute on chronic combined systolic and diastolic heart failure (HCC) Follow upw with Cardiology   Moderate episode of recurrent major depressive disorder (HCC) Flowsheet Row Office Visit from 02/11/2021 in [facility]  PHQ-9 Total Score 4    Scores are not indicative of dx. Discussed depression in more detail no reason to wake up in the morning than reminded him of his family. Follow up will discussion again  option medication and therapy or both.  Hospital discharge follow-up Retrieved from hospital d/c Recommendations for Outpatient Follow-up:  1. Follow up with new PCP Gwinda Passe in 1-2 weeks- completed ( time has not lapse to ck BMP) 1 week out  2. Follow upw with Cardiology  3. Please obtain BMP in one week on new Entresto, spironolactone 4. Follow up with CT surgery in 3 months  5. Follow up with Neurology in 4-6 weeks Referrals were placed for neurology and ST/OT in the hospital setting.  No referral made for cardiology.  That was completed at this visit.  [name], NP 8:58 AM

## 2021-02-16 ENCOUNTER — Encounter: Payer: Self-pay | Admitting: Thoracic Surgery (Cardiothoracic Vascular Surgery)

## 2021-02-18 ENCOUNTER — Telehealth: Payer: Self-pay | Admitting: Primary Care

## 2021-02-18 NOTE — Telephone Encounter (Signed)
   Todd Vega DOB: 1961/08/05 MRN: [id number]   RIDER WAIVER AND RELEASE OF LIABILITY  For purposes of improving physical access to our facilities, [facility]ega is pleased to partner with third parties to provide Todd Vega patients or other authorized individuals the option of convenient, on-demand ground transportation services (the Todd Vega") through use of the technology service that enables users to request on-demand ground transportation from independent third-party providers.  By opting to use and accept these Todd Vega, I, the undersigned, hereby agree on behalf of myself, and on behalf of any minor child using the Todd Vega for whom I am the parent or legal guardian, as follows:  1. Science writer provided to me are provided by independent third-party transportation providers who are not Todd Vega or employees and who are unaffiliated with Todd Vega. 2. Todd [facility]ega is neither a transportation carrier nor a common or public carrier. 3. Todd Vega has no control over the quality or safety of the transportation that occurs as a result of the Todd Vega. 4. [facility] cannot guarantee that any third-party transportation provider will complete any arranged transportation service. 5. [facility]ega makes no representation, warranty, or guarantee regarding the reliability, timeliness, quality, safety, suitability, or availability of any of the Transport Services or that they will be error free. 6. I fully understand that traveling by vehicle involves risks and dangers of serious bodily injury, including permanent disability, paralysis, and death. I agree, on behalf of myself and on behalf of any minor child using the Transport Services for whom I am the parent or legal guardian, that the entire risk arising out of my use of the Todd Vega remains solely with me, to the maximum extent permitted under applicable law. 7. The Todd Vega  are provided "as is" and "as available." Todd Vega disclaims all representations and warranties, express, implied or statutory, not expressly set out in these terms, including the implied warranties of merchantability and fitness for a particular purpose. 8. I hereby waive and release Todd Vega, its agents, employees, officers, directors, representatives, insurers, attorneys, assigns, successors, subsidiaries, and affiliates from any and all past, present, or future claims, demands, liabilities, actions, causes of action, or suits of any kind directly or indirectly arising from acceptance and use of the Todd Vega. 9. I further waive and release Todd Vega and its affiliates from all present and future liability and responsibility for any injury or death to persons or damages to property caused by or related to the use of the Todd Vega. 10. I have read this Waiver and Release of Liability, and I understand the terms used in it and their legal significance. This Waiver is freely and voluntarily given with the understanding that my right (as well as the right of any minor child for whom I am the parent or legal guardian using the Todd Vega) to legal recourse against Todd Vega in connection with the Todd Vega is knowingly surrendered in return for use of these services.   I attest that I read the consent document to Todd Vega, gave Todd Vega the opportunity to ask questions and answered the questions asked (if any). I affirm that Todd Vega then provided consent for he's participation in this program.     Todd Vega

## 2021-02-18 NOTE — Progress Notes (Signed)
Cardiology Office Note:    Date:  02/19/2021   ID:  Todd Vega, DOB 03-12-1961, MRN [id number]  PCP:  [name], NP   [facility] Providers Cardiologist:  [name], MD     Referring MD: [name], NP   Chief Complaint  Patient presents with  . Congestive Heart Failure       . Coronary Artery Disease    Feb 19, 2021:   Todd Vega is a 60 y.o. male with a hx of CVA, CHF. Seen with Niece, Lerry Liner.  I saw him in consultation in early April 2022 TEE showed EF 20-25% Cath showed bulky calcified ulcerated distal LM 50% stenos Ostial LAD 85%  D1 80% ostial  Ramus 70-80% stenosis LCx - 50-70% stenosis  Echo shows early  LV thrombus   He was seen by Dr. Cornelius Moras. He did not want CABG at the time  He still does not want to have CABG ,    Past Medical History:  Diagnosis Date  . Asthma due to seasonal allergies     Past Surgical History:  Procedure Laterality Date  . BUBBLE STUDY  01/25/2021   Procedure: BUBBLE STUDY;  Surgeon: Jodelle Red, MD;  Location: [facility] ENDOSCOPY;  Service: Cardiovascular;;  . RIGHT HEART CATH AND CORONARY ANGIOGRAPHY N/A 01/27/2021   Procedure: RIGHT HEART CATH AND CORONARY ANGIOGRAPHY;  Surgeon: Lyn Records, MD;  Location: [facility] INVASIVE CV LAB;  Service: Cardiovascular;  Laterality: N/A;  . TEE WITHOUT CARDIOVERSION N/A 01/25/2021   Procedure: TRANSESOPHAGEAL ECHOCARDIOGRAM (TEE);  Surgeon: Jodelle Red, MD;  Location: [facility] ENDOSCOPY;  Service: Cardiovascular;  Laterality: N/A;    Current Medications: Current Meds  Medication Sig  . sacubitril-valsartan (ENTRESTO) 49-51 MG Take 1 tablet by mouth 2 (two) times daily.     Allergies:   Chocolate and Coconut (cocos nucifera) allergy skin test   Social History   Socioeconomic History  . Marital status: Divorced    Spouse name: Not on file  . Number of children: 0  . Years of education: Not on file  . Highest education level: Some college, no degree   Occupational History  . Not on file  Tobacco Use  . Smoking status: Never Smoker  . Smokeless tobacco: Never Used  Vaping Use  . Vaping Use: Never used  Substance and Sexual Activity  . Alcohol use: Yes    Alcohol/week: 6.0 standard drinks    Types: 6 Cans of beer per week  . Drug use: Never  . Sexual activity: Not on file  Other Topics Concern  . Not on file  Social History Narrative  . Not on file   Social Determinants of Health   Financial Resource Strain: Low Risk   . Difficulty of Paying Living Expenses: Not very hard  Food Insecurity: No Food Insecurity  . Worried About Programme researcher, broadcasting/film/video in the Last Year: Never true  . Ran Out of Food in the Last Year: Never true  Transportation Needs: No Transportation Needs  . Lack of Transportation (Medical): No  . Lack of Transportation (Non-Medical): No  Physical Activity: Not on file  Stress: Not on file  Social Connections: Not on file     Family History: The patient's family history is not on file.  ROS:   Please see the history of present illness.     All other systems reviewed and are negative.  EKGs/Labs/Other Studies Reviewed:    The following studies were reviewed today:   EKG:  Recent Labs: 01/23/2021: ALT 18; TSH 1.221 01/26/2021: Magnesium 2.0 01/28/2021: Hemoglobin 13.7; Platelets 132 02/02/2021: BUN 9; Creatinine, Ser 1.15; Potassium 5.0; Sodium 135  Recent Lipid Panel    Component Value Date/Time   CHOL 185 01/24/2021 0228   TRIG 71 01/24/2021 0228   HDL 86 01/24/2021 0228   CHOLHDL 2.2 01/24/2021 0228   VLDL 14 01/24/2021 0228   LDLCALC 85 01/24/2021 0228     Risk Assessment/Calculations:       Physical Exam:    VS:  BP (!) 170/92   Pulse 66   Ht 5\' 5"  (1.651 m)   Wt 134 lb (60.8 kg)   SpO2 94%   BMI 22.30 kg/m     Wt Readings from Last 3 Encounters:  02/19/21 134 lb (60.8 kg)  02/11/21 134 lb (60.8 kg)  02/02/21 129 lb 4 oz (58.6 kg)     GEN: \middle age male,  HEENT:  Normal NECK: No JVD; No carotid bruits LYMPHATICS: No lymphadenopathy CARDIAC: RRR, no murmurs, rubs, gallops RESPIRATORY:  Clear to auscultation without rales, wheezing or rhonchi  ABDOMEN: Soft, non-tender, non-distended MUSCULOSKELETAL:  No edema; No deformity  SKIN: Warm and dry NEUROLOGIC:  S/p stroke,  Some speech difficultities PSYCHIATRIC:  Normal affect   ASSESSMENT:    1. Chronic combined systolic and diastolic heart failure (HCC)   2. Primary hypertension   3. Acute CVA (cerebrovascular accident) (HCC)    PLAN:    In order of problems listed above:  1. 1.  Chronic combined systolic and diastolic congestive heart failure:  His functional status appears to be improving.  Continue current medications.  His blood pressure is elevated today.  We will increase his Entresto.  Continue carvedilol and spironolactone.  2.  Coronary artery disease: He is not having any episodes of angina.  He has moderate to severe coronary artery disease..  Given his lack of symptoms he does not want to have surgery at this time.  We will continue to reassess.  3.  History of CVA.  He is currently on Eliquis. Echocardiogram with contrast shows what appears to be early thrombus formation in his apex. Cont eliquis          Medication Adjustments/Labs and Tests Ordered: Current medicines are reviewed at length with the patient today.  Concerns regarding medicines are outlined above.  Orders Placed This Encounter  Procedures  . Basic metabolic panel   Meds ordered this encounter  Medications  . sacubitril-valsartan (ENTRESTO) 49-51 MG    Sig: Take 1 tablet by mouth 2 (two) times daily.    Dispense:  60 tablet    Refill:  11  . carvedilol (COREG) 6.25 MG tablet    Sig: Take 1 tablet (6.25 mg total) by mouth 2 (two) times daily with a meal.    Dispense:  60 tablet    Refill:  0  . atorvastatin (LIPITOR) 20 MG tablet    Sig: Take 1 tablet (20 mg total) by mouth daily.    Dispense:  30  tablet    Refill:  0  . dapagliflozin propanediol ([name]) 10 MG TABS tablet    Sig: Take 1 tablet (10 mg total) by mouth daily before breakfast.    Dispense:  30 tablet    Refill:  0  . spironolactone (ALDACTONE) 25 MG tablet    Sig: Take 0.5 tablets (12.5 mg total) by mouth daily.    Dispense:  5 tablet    Refill:  0  IM program    Patient Instructions  Medication Instructions:  Your physician has recommended you make the following change in your medication:   INCREASE Entresto 49-51 twice a day  *If you need a refill on your cardiac medications before your next appointment, please call your pharmacy*   Lab Work: BMET 5/27 anytime between 7:30-4:30pm Your physician recommends that you return for lab work in: 3 weeks If you have labs (blood work) drawn today and your tests are completely normal, you will receive your results only by: Marland Kitchen MyChart Message (if you have MyChart) OR . A paper copy in the mail If you have any lab test that is abnormal or we need to change your treatment, we will call you to review the results.   Testing/Procedures: none   Follow-Up: At [facility], you and your health needs are our priority.  As part of our continuing mission to provide you with exceptional heart care, we have created designated Provider Care Teams.  These Care Teams include your primary Cardiologist (physician) and Advanced Practice Providers (APPs -  Physician Assistants and Nurse Practitioners) who all work together to provide you with the care you need, when you need it.  We recommend signing up for the patient portal called "MyChart".  Sign up information is provided on this After Visit Summary.  MyChart is used to connect with patients for Virtual Visits (Telemedicine).  Patients are able to view lab/test results, encounter notes, upcoming appointments, etc.  Non-urgent messages can be sent to your provider as well.   To learn more about what you can do with MyChart, go to  [email or web address].    Your next appointment:  7/18 at 9:20am 2 month(s)  The format for your next appointment:   In Person  Provider:   [name], MD     Signed, [name], MD  02/19/2021 5:14 PM    [facility]

## 2021-02-19 ENCOUNTER — Encounter: Payer: Self-pay | Admitting: Cardiovascular Disease

## 2021-02-19 ENCOUNTER — Other Ambulatory Visit: Payer: Self-pay

## 2021-02-19 ENCOUNTER — Ambulatory Visit (INDEPENDENT_AMBULATORY_CARE_PROVIDER_SITE_OTHER): Payer: Self-pay | Admitting: Cardiovascular Disease

## 2021-02-19 ENCOUNTER — Telehealth: Payer: Self-pay

## 2021-02-19 VITALS — BP 170/92 | HR 66 | Ht 65.0 in | Wt 134.0 lb

## 2021-02-19 DIAGNOSIS — I10 Essential (primary) hypertension: Secondary | ICD-10-CM

## 2021-02-19 DIAGNOSIS — I639 Cerebral infarction, unspecified: Secondary | ICD-10-CM

## 2021-02-19 DIAGNOSIS — I5042 Chronic combined systolic (congestive) and diastolic (congestive) heart failure: Secondary | ICD-10-CM

## 2021-02-19 LAB — ECHOCARDIOGRAM LIMITED
Calc EF: 28.7 %
S' Lateral: 6 cm
Single Plane A2C EF: 6.9 %
Single Plane A4C EF: 40.1 %
Weight: 2130.53 oz

## 2021-02-19 MED ORDER — SPIRONOLACTONE 25 MG PO TABS
12.5000 mg | ORAL_TABLET | Freq: Every day | ORAL | 0 refills | Status: DC
  Filled 2021-02-19: qty 5, 10d supply, fill #0

## 2021-02-19 MED ORDER — CARVEDILOL 6.25 MG PO TABS
6.2500 mg | ORAL_TABLET | Freq: Two times a day (BID) | ORAL | 0 refills | Status: DC
  Filled 2021-02-19: qty 60, 30d supply, fill #0

## 2021-02-19 MED ORDER — DAPAGLIFLOZIN PROPANEDIOL 10 MG PO TABS
10.0000 mg | ORAL_TABLET | Freq: Every day | ORAL | 0 refills | Status: DC
  Filled 2021-02-19: qty 30, 30d supply, fill #0

## 2021-02-19 MED ORDER — ENTRESTO 49-51 MG PO TABS
1.0000 | ORAL_TABLET | Freq: Two times a day (BID) | ORAL | 11 refills | Status: DC
  Filled 2021-02-19: qty 60, 30d supply, fill #0

## 2021-02-19 MED ORDER — ATORVASTATIN CALCIUM 20 MG PO TABS
20.0000 mg | ORAL_TABLET | Freq: Every day | ORAL | 0 refills | Status: DC
  Filled 2021-02-19: qty 30, 30d supply, fill #0

## 2021-02-19 MED ORDER — APIXABAN 5 MG PO TABS
5.0000 mg | ORAL_TABLET | Freq: Two times a day (BID) | ORAL | 5 refills | Status: DC
Start: 2021-02-19 — End: 2021-10-22
  Filled 2021-02-19: qty 60, 30d supply, fill #0

## 2021-02-19 NOTE — Patient Instructions (Addendum)
Medication Instructions:  Your physician has recommended you make the following change in your medication:   INCREASE Entresto 49-51 twice a day  *If you need a refill on your cardiac medications before your next appointment, please call your pharmacy*   Lab Work: BMET 5/27 anytime between 7:30-4:30pm Your physician recommends that you return for lab work in: 3 weeks If you have labs (blood work) drawn today and your tests are completely normal, you will receive your results only by: Marland Kitchen MyChart Message (if you have MyChart) OR . A paper copy in the mail If you have any lab test that is abnormal or we need to change your treatment, we will call you to review the results.   Testing/Procedures: none   Follow-Up: At [facility], you and your health needs are our priority.  As part of our continuing mission to provide you with exceptional heart care, we have created designated Provider Care Teams.  These Care Teams include your primary Cardiologist (physician) and Advanced Practice Providers (APPs -  Physician Assistants and Nurse Practitioners) who all work together to provide you with the care you need, when you need it.  We recommend signing up for the patient portal called "MyChart".  Sign up information is provided on this After Visit Summary.  MyChart is used to connect with patients for Virtual Visits (Telemedicine).  Patients are able to view lab/test results, encounter notes, upcoming appointments, etc.  Non-urgent messages can be sent to your provider as well.   To learn more about what you can do with MyChart, go to [email or web address].    Your next appointment:  7/18 at 9:20am 2 month(s)  The format for your next appointment:   In Person  Provider:   [name], MD

## 2021-02-19 NOTE — Telephone Encounter (Signed)
Pt saw Dr Elease Hashimoto today 02/19/21, last labs 02/02/21 Creat 1.15, age 60, weight 60.8kg, based on specified criteria pt is on appropriate dosage of Eliquis 5mg  BID.  Will refill rx.

## 2021-02-19 NOTE — Telephone Encounter (Signed)
Gave patient applications for patient assistance for [employer], and Eliquis with instructions to complete patient portion and bring to MD to complete MD portion and submit.  Advised to complete before future refills needed.

## 2021-02-22 ENCOUNTER — Other Ambulatory Visit: Payer: Self-pay

## 2021-02-22 ENCOUNTER — Other Ambulatory Visit: Payer: Self-pay | Admitting: *Deleted

## 2021-02-22 NOTE — Telephone Encounter (Signed)
Pts sister, Singleton Hickox, calling to have an update. She states that the cardiologist is requesting to have this paperwork. Please advise.

## 2021-02-22 NOTE — Patient Outreach (Signed)
[facility]) Care Management  02/22/2021  Todd Vega 1961/02/28 [id number]   Outgoing call placed to member's sister, state he is doing "great."  He has continued speech therapy, now able to get medications delivered.  No further concerned voiced, will close case at this time.  Kemper Durie, [name], MSN [facility] Care Management  [facility] Manager [phone]

## 2021-02-23 ENCOUNTER — Other Ambulatory Visit: Payer: Self-pay

## 2021-02-23 NOTE — Telephone Encounter (Signed)
Please advice  

## 2021-02-24 ENCOUNTER — Other Ambulatory Visit: Payer: Self-pay

## 2021-02-24 ENCOUNTER — Telehealth: Payer: Self-pay | Admitting: Cardiovascular Disease

## 2021-02-24 MED ORDER — SPIRONOLACTONE 25 MG PO TABS
12.5000 mg | ORAL_TABLET | Freq: Every day | ORAL | 6 refills | Status: DC
  Filled 2021-02-24: qty 15, 30d supply, fill #0
  Filled 2021-03-24: qty 15, 30d supply, fill #1

## 2021-02-24 NOTE — Telephone Encounter (Signed)
Left message for patient to return call. There is no DPR on file to speak with patient's sister. Called [facility] pharmacy and was advised that a 30 day supply of spironolactone can be sent for refill. I called patient's sister, Tomma Lightning, and advised that a refill has been sent to the pharmacy.

## 2021-02-24 NOTE — Telephone Encounter (Signed)
Patient's sister calling to say the pharmacy told them they had already given them spironolactone (ALDACTONE) 25 MG tablet prescription. But the sister said they have look all in the house for it and they dont have it. Sister was calling in to see what they need to get to make sure they get that medication. Please advise

## 2021-02-25 ENCOUNTER — Ambulatory Visit: Payer: Self-pay | Admitting: Adult Health

## 2021-02-25 ENCOUNTER — Encounter: Payer: Self-pay | Admitting: Adult Health

## 2021-02-25 VITALS — BP 184/120 | HR 69 | Ht 65.0 in | Wt 135.0 lb

## 2021-02-25 DIAGNOSIS — I10 Essential (primary) hypertension: Secondary | ICD-10-CM

## 2021-02-25 DIAGNOSIS — R4701 Aphasia: Secondary | ICD-10-CM

## 2021-02-25 DIAGNOSIS — I513 Intracardiac thrombosis, not elsewhere classified: Secondary | ICD-10-CM

## 2021-02-25 DIAGNOSIS — E785 Hyperlipidemia, unspecified: Secondary | ICD-10-CM

## 2021-02-25 DIAGNOSIS — I63412 Cerebral infarction due to embolism of left middle cerebral artery: Secondary | ICD-10-CM

## 2021-02-25 NOTE — Progress Notes (Signed)
[facility] [address] [place] [address] [phone]       HOSPITAL FOLLOW UP NOTE  [name] Date of Birth:  1961-09-05 Medical Record Number:  [id number]   Reason for Referral:  hospital stroke follow up    SUBJECTIVE:   CHIEF COMPLAINT:  Chief Complaint  Patient presents with  . Follow-up    Rm 14 with sister Todd Vega) PT is well and stable, some R hand weakness at times     HPI:   [name] is a 60 y.o. male w/pmh of alcohol use and medical non compliance who presented to [facility] ED on 01/23/2021 after awakening in the night with onset of aphasia and facial droop.  Personally reviewed hospitalization pertinent progress notes, lab work and imaging with summary provided.  Evaluated by Dr. 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  Initial 2D echo showed EF 25 to 30% 4/9 and 2D echo limited 4/12 showed concern of early thrombus formation in the apex and LV EF <20%.  Initially on heparin and transition to Eliquis recommend 50-month duration at discharge as well as aspirin.  CTA head/neck without significant atherosclerosis.  TEE 4/11 no evidence of cardiac source of embolism or PFO.  TCD negative for PFO.  Cardiac cath showed moderate to severe CAD. LDL 85 and initiated atorvastatin 40 mg daily.  HTN with elevated BP and cardiology initiating Entresto and spironolactone prior to discharge.  A1c 5.6.  Residual deficits of dense expressive aphasia and mild right grip weakness.  Evaluated by therapies  Today, 02/25/2021, [name] is being seen for hospital follow-up accompanied by his sister, Todd Vega.  Doing well since discharge with residual expressive aphasia and mild hand weakness with inmprovement. Was working with [facility] therapies but since completed. Currently in the process of applying for [facility] Financial assistance as well as long-term disability.  He continues to do exercises at home.  Currently living  with his sister but is able to maintain ADLs and majority of IADLs independently.  Denies cognitive deficits or memory concerns.  Denies new stroke/TIA symptoms.  Remains on aspirin and Eliquis as well as atorvastatin without associated side effects Blood pressure today 184/120 - currently working with cardiology for medication adjustment with recent dosage increase of Entresto and continued use of carvedilol and spironolactone  No further concerns at this time    ROS:   14 system review of systems performed and negative with exception of those listed in HPI  PMH:  Past Medical History:  Diagnosis Date  . Asthma due to seasonal allergies     PSH:  Past Surgical History:  Procedure Laterality Date  . BUBBLE STUDY  01/25/2021   Procedure: BUBBLE STUDY;  Surgeon: Jodelle Red, MD;  Location: [facility] ENDOSCOPY;  Service: Cardiovascular;;  . RIGHT HEART CATH AND CORONARY ANGIOGRAPHY N/A 01/27/2021   Procedure: RIGHT HEART CATH AND CORONARY ANGIOGRAPHY;  Surgeon: Lyn Records, MD;  Location: [facility] INVASIVE CV LAB;  Service: Cardiovascular;  Laterality: N/A;  . TEE WITHOUT CARDIOVERSION N/A 01/25/2021   Procedure: TRANSESOPHAGEAL ECHOCARDIOGRAM (TEE);  Surgeon: Jodelle Red, MD;  Location: [facility] ENDOSCOPY;  Service: Cardiovascular;  Laterality: N/A;    Social History:  Social History   Socioeconomic History  . Marital status: Divorced    Spouse name: Not on file  . Number of children: 0  . Years of education: Not on file  . Highest education level: Some college, no degree  Occupational History  .  Not on file  Tobacco Use  . Smoking status: Never Smoker  . Smokeless tobacco: Never Used  Vaping Use  . Vaping Use: Never used  Substance and Sexual Activity  . Alcohol use: Yes    Alcohol/week: 6.0 standard drinks    Types: 6 Cans of beer per week  . Drug use: Never  . Sexual activity: Not on file  Other Topics Concern  . Not on file  Social History Narrative  . Not  on file   Social Determinants of Health   Financial Resource Strain: Low Risk   . Difficulty of Paying Living Expenses: Not very hard  Food Insecurity: No Food Insecurity  . Worried About Programme researcher, broadcasting/film/video in the Last Year: Never true  . Ran Out of Food in the Last Year: Never true  Transportation Needs: No Transportation Needs  . Lack of Transportation (Medical): No  . Lack of Transportation (Non-Medical): No  Physical Activity: Not on file  Stress: Not on file  Social Connections: Not on file  Intimate Partner Violence: Not on file    Family History: History reviewed. No pertinent family history.  Medications:   Current Outpatient Medications on File Prior to Visit  Medication Sig Dispense Refill  . apixaban (ELIQUIS) 5 MG TABS tablet Take 1 tablet (5 mg total) by mouth 2 (two) times daily. 60 tablet 5  . aspirin 81 MG EC tablet Take 1 tablet (81 mg total) by mouth daily. Swallow whole. 30 tablet 0  . atorvastatin (LIPITOR) 20 MG tablet Take 1 tablet (20 mg total) by mouth daily. 30 tablet 0  . carvedilol (COREG) 6.25 MG tablet Take 1 tablet (6.25 mg total) by mouth 2 (two) times daily with a meal. 60 tablet 0  . dapagliflozin propanediol ([name]) 10 MG TABS tablet Take 1 tablet (10 mg total) by mouth daily before breakfast. 30 tablet 0  . fexofenadine (ALLEGRA) 180 MG tablet Take 180 mg by mouth as needed for allergies or rhinitis.    Marland Kitchen sacubitril-valsartan (ENTRESTO) 49-51 MG Take 1 tablet by mouth 2 (two) times daily. 60 tablet 11  . spironolactone (ALDACTONE) 25 MG tablet Take 0.5 tablets (12.5 mg total) by mouth daily. 15 tablet 6   No current facility-administered medications on file prior to visit.    Allergies:   Allergies  Allergen Reactions  . Chocolate   . Coconut (Cocos Nucifera) Allergy Skin Test       OBJECTIVE:  Physical Exam  Vitals:   02/25/21 1016  Weight: 135 lb (61.2 kg)  Height: 5\' 5"  (1.651 m)   Body mass index is 22.47 kg/m. No exam  data present  Post stroke PHQ 2/9 Depression screen PHQ 2/9 02/11/2021  Decreased Interest 1  Down, Depressed, Hopeless 2  PHQ - 2 Score 3  Altered sleeping 0  Tired, decreased energy 0  Change in appetite 0  Feeling bad or failure about yourself  1  Trouble concentrating 0  Moving slowly or fidgety/restless 0  Suicidal thoughts 0  PHQ-9 Score 4     General: Frail very pleasant middle-aged African-American male, seated, in no evident distress Head: head normocephalic and atraumatic.   Neck: supple with no carotid or supraclavicular bruits Cardiovascular: regular rate and rhythm, no murmurs Musculoskeletal: no deformity Skin:  no rash/petichiae Vascular:  Normal pulses all extremities   Neurologic Exam Mental Status: Awake and fully alert. mild aphasia and moderate dysarthria. Oriented to place and time. Recent and remote memory intact. Attention span, concentration  and fund of knowledge appropriate. Mood and affect appropriate. Recall: 1/3. Additions some difficulty. 4 legged animal naming 10 in 30 seconds.  Cranial Nerves: Fundoscopic exam reveals sharp disc margins. Pupils equal, briskly reactive to light. Extraocular movements full without nystagmus. Visual fields full to confrontation. Hearing intact. Facial sensation intact. Right lower facial weakness. Tongue deviates towards right.  palate moves normally and symmetrically.  Motor: Normal bulk and tone. Normal strength in all tested extremity muscles except slight right hand grip weakness Sensory.: intact to touch , pinprick , position and vibratory sensation.  Coordination: Rapid alternating movements normal in all extremities except slightly decreased right hand dexterity. Finger-to-nose and heel-to-shin performed accurately bilaterally. Gait and Station: Arises from chair without difficulty. Stance is normal. Gait demonstrates normal stride length and balance without use of assitive device. Tandem walk and heel toe without  difficulty.  Reflexes: 1+ and symmetric. Toes downgoing.     NIHSS  3 Modified Rankin  2      ASSESSMENT: Todd Vega is a 60 y.o. 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 Vascular risk factors include LV thrombus, mod to severe CAD, CHF with EF 20 to 25%, HTN, HLD, EtOH use and medical noncompliance.      PLAN:  1. Left MCA stroke:  a. Residual deficit: Mild aphasia and moderate dysarthria, mild right hand weakness and mild right facial weakness.  Encourage continued HEP as advised during [facility] SLP sessions.  Advised him to contact office if he would like to pursue additional outpatient therapies if/when he secures [facility] Financial assistance or other insurance.   b. Continue aspirin 81 mg daily and Eliquis (apixaban) daily  and atorvastatin 40 mg daily for secondary stroke prevention.  Discussed secondary stroke prevention measures and importance of close PCP follow up for aggressive stroke risk factor management  2. HTN: BP goal <130/90.  Remains uncontrolled closely followed by cardiology currently making adjustments 3. HLD: LDL goal <70. Recent LDL 85 - started atorvastatin 40 mg daily.  4. LV thrombus: Routinely followed by cardiology per recent note "continue eliquis for 3 months, cardiac MRI to assess viability and CT surgery to continue to evaluate for possible revascularization"    Follow up in 3 months or call earlier if needed   CC:  [facility] provider: Dr. Pearlean Brownie PCP: [name], NP    I spent 48 minutes of face-to-face and non-face-to-face time with patient and sister.  This included previsit chart review including recent hospitalization progress notes, lab work and imaging, extensive discussion regarding recent stroke and likely etiology as well as residual deficits and typical recovery time, education for  secondary stroke prevention measures and routine follow-up with PCP/cardiology for aggressive stroke risk factor management and answered all other questions to patient and sister satisfaction   Ihor Austin, AGNP-BC  [facility] [address] [place], [place] [address]  Phone [phone] Fax [phone] Note: This document was prepared with digital dictation and possible smart phrase technology. Any transcriptional errors that result from this process are unintentional.

## 2021-02-25 NOTE — Patient Instructions (Addendum)
Continue aspirin 81 mg daily and Eliquis (apixaban) daily  and atorvastatin  for secondary stroke prevention  Graduated return to driving as recommended.  It is recommended that you first drive with another licensed driver in an empty parking lot. If you do well with this, you can drive on a quiet street with the licensed driver.  If you do well with this, you can drive on a busy street with a licensed driver.  If you continue to do well, you can be cleared to drive independently.  For the first month after resuming driving, it is recommend no nighttime, busy/heavy traffic roads or Interstate driving.   Continue to follow up with PCP/cardiology regarding cholesterol and blood pressure management  Maintain strict control of hypertension with blood pressure goal below 130/90 and cholesterol with LDL cholesterol (bad cholesterol) goal below 70 mg/dL.       Followup in the future with me in 3 months or call earlier if needed       Thank you for coming to see [place] at [facility]. I hope we have been able to provide you high quality care today.  You may receive a patient satisfaction survey over the next few weeks. We would appreciate your feedback and comments so that we may continue to improve ourselves and the health of our patients.

## 2021-02-25 NOTE — Progress Notes (Signed)
I agree with the above plan 

## 2021-03-01 ENCOUNTER — Other Ambulatory Visit: Payer: Self-pay

## 2021-03-02 ENCOUNTER — Other Ambulatory Visit: Payer: Self-pay

## 2021-03-08 ENCOUNTER — Other Ambulatory Visit: Payer: Self-pay

## 2021-03-12 ENCOUNTER — Other Ambulatory Visit: Payer: Self-pay | Admitting: *Deleted

## 2021-03-12 ENCOUNTER — Other Ambulatory Visit: Payer: Self-pay

## 2021-03-12 DIAGNOSIS — I5042 Chronic combined systolic (congestive) and diastolic (congestive) heart failure: Secondary | ICD-10-CM

## 2021-03-12 LAB — BASIC METABOLIC PANEL
BUN/Creatinine Ratio: 7 — ABNORMAL LOW (ref 10–24)
BUN: 8 mg/dL (ref 8–27)
CO2: 26 mmol/L (ref 20–29)
Calcium: 9.1 mg/dL (ref 8.6–10.2)
Chloride: 100 mmol/L (ref 96–106)
Creatinine, Ser: 1.15 mg/dL (ref 0.76–1.27)
Glucose: 103 mg/dL — ABNORMAL HIGH (ref 65–99)
Potassium: 4.7 mmol/L (ref 3.5–5.2)
Sodium: 137 mmol/L (ref 134–144)
eGFR: 73 mL/min/{1.73_m2} (ref >59)

## 2021-03-17 ENCOUNTER — Other Ambulatory Visit: Payer: Self-pay

## 2021-03-24 ENCOUNTER — Other Ambulatory Visit: Payer: Self-pay

## 2021-03-24 ENCOUNTER — Other Ambulatory Visit: Payer: Self-pay | Admitting: Cardiovascular Disease

## 2021-03-24 MED ORDER — ATORVASTATIN CALCIUM 20 MG PO TABS
20.0000 mg | ORAL_TABLET | Freq: Every day | ORAL | 3 refills | Status: DC
  Filled 2021-03-24: qty 30, 30d supply, fill #0

## 2021-03-24 MED ORDER — CARVEDILOL 6.25 MG PO TABS
6.2500 mg | ORAL_TABLET | Freq: Two times a day (BID) | ORAL | 3 refills | Status: DC
  Filled 2021-03-24: qty 60, 30d supply, fill #0

## 2021-03-25 ENCOUNTER — Other Ambulatory Visit: Payer: Self-pay

## 2021-03-25 ENCOUNTER — Encounter (INDEPENDENT_AMBULATORY_CARE_PROVIDER_SITE_OTHER): Payer: Self-pay | Admitting: Primary Care

## 2021-03-25 ENCOUNTER — Ambulatory Visit (INDEPENDENT_AMBULATORY_CARE_PROVIDER_SITE_OTHER): Payer: Self-pay | Admitting: Primary Care

## 2021-03-25 VITALS — BP 172/114 | HR 68 | Temp 97.5°F | Ht 65.0 in | Wt 135.4 lb

## 2021-03-25 DIAGNOSIS — F411 Generalized anxiety disorder: Secondary | ICD-10-CM

## 2021-03-25 DIAGNOSIS — I10 Essential (primary) hypertension: Secondary | ICD-10-CM

## 2021-03-25 DIAGNOSIS — F331 Major depressive disorder, recurrent, moderate: Secondary | ICD-10-CM

## 2021-03-25 MED ORDER — FLUOXETINE HCL 20 MG PO CAPS
20.0000 mg | ORAL_CAPSULE | Freq: Every day | ORAL | 2 refills | Status: DC
  Filled 2021-03-25: qty 30, 30d supply, fill #0
  Filled 2021-04-26: qty 30, 30d supply, fill #1
  Filled 2021-05-24: qty 30, 30d supply, fill #2

## 2021-03-25 MED ORDER — CLONIDINE HCL 0.1 MG PO TABS
0.2000 mg | ORAL_TABLET | Freq: Once | ORAL | Status: AC
  Administered 2021-03-25: 0.2 mg via ORAL

## 2021-03-25 NOTE — Patient Instructions (Signed)
Fluoxetine capsules or tablets (Depression/Mood Disorders) What is this medicine? FLUOXETINE (floo OX e teen) belongs to a class of drugs known as selective serotonin reuptake inhibitors (SSRIs). It helps to treat mood problems such as depression, obsessive compulsive disorder, and panic attacks. It can also treat certain eating disorders. This medicine may be used for other purposes; ask your health care provider or pharmacist if you have questions. COMMON BRAND NAME(S): Prozac What should I tell my health care provider before I take this medicine? They need to know if you have any of these conditions:  bipolar disorder or a family history of bipolar disorder  bleeding disorders  glaucoma  heart disease  liver disease  low levels of sodium in the blood  seizures  suicidal thoughts, plans, or attempt; a previous suicide attempt by you or a family member  take MAOIs like Carbex, Eldepryl, Marplan, Nardil, and Parnate  take medicines that treat or prevent blood clots  thyroid disease  an unusual or allergic reaction to fluoxetine, other medicines, foods, dyes, or preservatives  pregnant or trying to get pregnant  breast-feeding How should I use this medicine? Take this medicine by mouth with a glass of water. Follow the directions on the prescription label. You can take this medicine with or without food. Take your medicine at regular intervals. Do not take it more often than directed. Do not stop taking this medicine suddenly except upon the advice of your doctor. Stopping this medicine too quickly may cause serious side effects or your condition may worsen. A special MedGuide will be given to you by the pharmacist with each prescription and refill. Be sure to read this information carefully each time. Talk to your pediatrician regarding the use of this medicine in children. While this drug may be prescribed for children as young as 7 years for selected conditions, precautions do  apply. Overdosage: If you think you have taken too much of this medicine contact a poison control center or emergency room at once. NOTE: This medicine is only for you. Do not share this medicine with others. What if I miss a dose? If you miss a dose, skip the missed dose and go back to your regular dosing schedule. Do not take double or extra doses. What may interact with this medicine? Do not take this medicine with any of the following medications:  other medicines containing fluoxetine, like Sarafem or Symbyax  cisapride  dronedarone  linezolid  MAOIs like Carbex, Eldepryl, Marplan, Nardil, and Parnate  methylene blue (injected into a vein)  pimozide  thioridazine This medicine may also interact with the following medications:  alcohol  amphetamines  aspirin and aspirin-like medicines  carbamazepine  certain medicines for depression, anxiety, or psychotic disturbances  certain medicines for migraine headaches like almotriptan, eletriptan, frovatriptan, naratriptan, rizatriptan, sumatriptan, zolmitriptan  digoxin  diuretics  fentanyl  flecainide  furazolidone  isoniazid  lithium  medicines for sleep  medicines that treat or prevent blood clots like warfarin, enoxaparin, and dalteparin  NSAIDs, medicines for pain and inflammation, like ibuprofen or naproxen  other medicines that prolong the QT interval (an abnormal heart rhythm)  phenytoin  procarbazine  propafenone  rasagiline  ritonavir  supplements like St. John's wort, kava kava, valerian  tramadol  tryptophan  vinblastine This list may not describe all possible interactions. Give your health care provider a list of all the medicines, herbs, non-prescription drugs, or dietary supplements you use. Also tell them if you smoke, drink alcohol, or use illegal drugs.   Some items may interact with your medicine. What should I watch for while using this medicine? Tell your doctor if your  symptoms do not get better or if they get worse. Visit your doctor or health care professional for regular checks on your progress. Because it may take several weeks to see the full effects of this medicine, it is important to continue your treatment as prescribed by your doctor. Patients and their families should watch out for new or worsening thoughts of suicide or depression. Also watch out for sudden changes in feelings such as feeling anxious, agitated, panicky, irritable, hostile, aggressive, impulsive, severely restless, overly excited and hyperactive, or not being able to sleep. If this happens, especially at the beginning of treatment or after a change in dose, call your health care professional. You may get drowsy or dizzy. Do not drive, use machinery, or do anything that needs mental alertness until you know how this medicine affects you. Do not stand or sit up quickly, especially if you are an older patient. This reduces the risk of dizzy or fainting spells. Alcohol may interfere with the effect of this medicine. Avoid alcoholic drinks. Your mouth may get dry. Chewing sugarless gum or sucking hard candy, and drinking plenty of water may help. Contact your doctor if the problem does not go away or is severe. This medicine may affect blood sugar levels. If you have diabetes, check with your doctor or health care professional before you change your diet or the dose of your diabetic medicine. What side effects may I notice from receiving this medicine? Side effects that you should report to your doctor or health care professional as soon as possible:  allergic reactions like skin rash, itching or hives, swelling of the face, lips, or tongue  anxious  black, tarry stools  breathing problems  changes in vision  confusion  elevated mood, decreased need for sleep, racing thoughts, impulsive behavior  eye pain  fast, irregular heartbeat  feeling faint or lightheaded, falls  feeling  agitated, angry, or irritable  hallucination, loss of contact with reality  loss of balance or coordination  loss of memory  painful or prolonged erections  restlessness, pacing, inability to keep still  seizures  stiff muscles  suicidal thoughts or other mood changes  trouble sleeping  unusual bleeding or bruising  unusually weak or tired  vomiting Side effects that usually do not require medical attention (report to your doctor or health care professional if they continue or are bothersome):  change in appetite or weight  change in sex drive or performance  diarrhea  dry mouth  headache  increased sweating  nausea  tremors This list may not describe all possible side effects. Call your doctor for medical advice about side effects. You may report side effects to FDA at 1-800-FDA-1088. Where should I keep my medicine? Keep out of the reach of children. Store at room temperature between 15 and 30 degrees C (59 and 86 degrees F). Throw away any unused medicine after the expiration date. NOTE: This sheet is a summary. It may not cover all possible information. If you have questions about this medicine, talk to your doctor, pharmacist, or health care provider.  2021 Elsevier/Gold Standard (2018-05-24 11:56:53)  

## 2021-03-25 NOTE — Progress Notes (Signed)
  Subjective:   Todd Vega is an 60 y.o. male who presents for evaluation and treatment of depressive symptoms.  Onset approximately 3 months ago, rapidly worsening since that time.  Current symptoms include depressed mood, insomnia, fatigue, feelings of worthlessness/guilt, loss of energy/fatigue, decreased appetite,.  Current treatment for depression:None Sleep problems: Absent   Early awakening:Mild   Energy: Poor Motivation: Poor Concentration: Good Rumination/worrying: Moderate Memory: Fair Tearfulness: Absent  Anxiety: Moderate  Panic: Moderate  Overall Mood: Moderately worse  Hopelessness: Absent Suicidal ideation: Moderate  Other/Psychosocial Stressors: Stroke 01/22/21 unable to work stress over bills  Family history positive for depression in the patient's sister(s).  Previous treatment modalities employed include None.  Past episodes of depression: NONE Organic causes of depression present: None.  Review of Systems Behavioral/Psych: positive for anxiety and depression   Objective:   Mental Status Examination: Posture and motor behavior: Appropriate Dress, grooming, personal hygiene: Appropriate Facial expression: Appropriate Speech: slurred  Mood: Appropriate Coherency and relevance of thought: Appropriate Thought content: Negative Perceptions: Negative Orientation:Appropriate Attention and concentration: Positive Memory: : Appropriate Information: Not examined Vocabulary: good with difficulty with pronunciation s/p CVA  Abstract reasoning: Negative Judgment: Negative    Assessment:   Experiencing the following symptoms of depression most of the day nearly every day for more than two consecutive weeks: depressed mood, loss of interests/pleasure, change in appetite or weight, loss of energy, thoughts of worthlessness or guilt  Depressive Disorder:yes  Suicide Risk Assessment:  Suicidal intent: no Suicidal plan: no Access to means for suicide:  no Lethality of means for suicide: no Prior suicide attempts: no Recent exposure to suicide:no   Plan:   Essential hypertension - Plan: cloNIDine (CATAPRES) tablet 0.2 mg Followed by cardiology . Notify of Bp . Responded back   Moderate episode of recurrent major depressive disorder (HCC) - Plan: FLUoxetine (PROZAC) 20 MG capsule  Generalized anxiety disorder - Plan: FLUoxetine (PROZAC) 20 MG capsule  Reviewed concept of depression as biochemical imbalance of neurotransmitters and rationale for treatment. Instructed patient to contact office or on-call physician promptly should condition worsen or any new symptoms appear and provided on-call telephone numbers.

## 2021-03-29 ENCOUNTER — Telehealth: Payer: Self-pay | Admitting: Cardiovascular Disease

## 2021-03-29 DIAGNOSIS — I10 Essential (primary) hypertension: Secondary | ICD-10-CM

## 2021-03-29 MED ORDER — ENTRESTO 97-103 MG PO TABS: 1.0000 | ORAL_TABLET | Freq: Two times a day (BID) | ORAL | 11 refills | Status: DC

## 2021-03-29 MED ORDER — SPIRONOLACTONE 25 MG PO TABS
25.0000 mg | ORAL_TABLET | Freq: Every day | ORAL | 3 refills | Status: DC
Start: 2021-03-29 — End: 2021-03-30

## 2021-03-29 NOTE — Telephone Encounter (Signed)
Pt c/o BP issue: STAT if pt c/o blurred vision, one-sided weakness or slurred speech  1. What are your last 5 BP readings?  188/128 today  Was also high at recent PCP visit was given medication to make it go down   2. Are you having any other symptoms (ex. Dizziness, headache, blurred vision, passed out)? No   3. What is your BP issue? Ever since stroke hypertension has been occurring still taking medications as prescribed. Please advise.

## 2021-03-29 NOTE — Telephone Encounter (Signed)
Patient's sister call back to give the rest of his reading 191/181 172/100 138/95 167/102

## 2021-03-29 NOTE — Telephone Encounter (Signed)
RN returned call to patients sister to update of medication changes. Sister verbalized understanding. New scripts sent to pharmacy. Patient scheduled to be seen at the HTN clinic on 6/16 at 9:00am. BMET scheduled for 6/23. Encouraged sister to contact the office with any questions or concerns.

## 2021-03-30 ENCOUNTER — Other Ambulatory Visit: Payer: Self-pay

## 2021-03-30 ENCOUNTER — Telehealth: Payer: Self-pay | Admitting: Cardiovascular Disease

## 2021-03-30 MED ORDER — ENTRESTO 97-103 MG PO TABS: 1.0000 | ORAL_TABLET | Freq: Two times a day (BID) | ORAL | 3 refills | Status: DC

## 2021-03-30 MED ORDER — SPIRONOLACTONE 25 MG PO TABS
25.0000 mg | ORAL_TABLET | Freq: Every day | ORAL | 3 refills | Status: DC
  Filled 2021-03-30: qty 90, 90d supply, fill #0
  Filled 2021-04-01: qty 30, 30d supply, fill #0

## 2021-03-30 NOTE — Telephone Encounter (Signed)
Pt c/o medication issue:  1. Name of Medication:  spironolactone (ALDACTONE) 25 MG tablet sacubitril-valsartan (ENTRESTO) 97-103 MG  2. How are you currently taking this medication (dosage and times per day)?   3. Are you having a reaction (difficulty breathing--STAT)? No   4. What is your medication issue?  Patient's sister states the patient is unable to afford these medications as they will cost him $700. She would like to know if he is eligible for [employer] pt assistance and she provided a phone number for them, [phone].

## 2021-03-30 NOTE — Telephone Encounter (Signed)
**Note De-Identified Delroy Ordway Obfuscation** Pts sister Tomma Lightning states that the pts Spironolactone and his [name] was increased yesterday but that a new RX was not sent in for either.  She states that the pt gets his Spironolactone from [facility] and [facility] and a new RX needs to be sent to [facility] pt asst [employer] for [employer].  I have sent both RXs to the pharmacies as she requested. She thanked me for my assistance.

## 2021-04-01 ENCOUNTER — Other Ambulatory Visit: Payer: Self-pay

## 2021-04-01 ENCOUNTER — Ambulatory Visit (INDEPENDENT_AMBULATORY_CARE_PROVIDER_SITE_OTHER): Payer: Self-pay | Admitting: Pharmacist

## 2021-04-01 VITALS — BP 194/106 | HR 74 | Wt 133.0 lb

## 2021-04-01 DIAGNOSIS — E7849 Other hyperlipidemia: Secondary | ICD-10-CM

## 2021-04-01 DIAGNOSIS — I10 Essential (primary) hypertension: Secondary | ICD-10-CM

## 2021-04-01 DIAGNOSIS — I5043 Acute on chronic combined systolic (congestive) and diastolic (congestive) heart failure: Secondary | ICD-10-CM

## 2021-04-01 MED ORDER — ATORVASTATIN CALCIUM 80 MG PO TABS
80.0000 mg | ORAL_TABLET | Freq: Every day | ORAL | 3 refills | Status: DC
  Filled 2021-04-01: qty 30, 30d supply, fill #0
  Filled 2021-04-28: qty 30, 30d supply, fill #1
  Filled 2021-05-31: qty 30, 30d supply, fill #2
  Filled 2021-07-05: qty 30, 30d supply, fill #3
  Filled 2021-08-09: qty 30, 30d supply, fill #4
  Filled 2021-09-13: qty 30, 30d supply, fill #5
  Filled 2021-10-18: qty 30, 30d supply, fill #6
  Filled 2021-11-18: qty 30, 30d supply, fill #0
  Filled 2021-12-23: qty 30, 30d supply, fill #1
  Filled 2022-01-26: qty 30, 30d supply, fill #2
  Filled 2022-02-22: qty 30, 30d supply, fill #3
  Filled 2022-03-24: qty 30, 30d supply, fill #4

## 2021-04-01 MED ORDER — CARVEDILOL 12.5 MG PO TABS
12.5000 mg | ORAL_TABLET | Freq: Two times a day (BID) | ORAL | 3 refills | Status: DC
  Filled 2021-04-01: qty 60, 30d supply, fill #0

## 2021-04-01 NOTE — Progress Notes (Signed)
Patient ID: Todd Vega                 DOB: [birth year]                      MRN: [id number]     HPI: Todd Vega is a 60 y.o. male referred by Dr. Elease Hashimoto to pharmacy clinic for BP management. PMH is significant for CVA, CHF with LVEF < 20% on 01/26/21 echo, CAD - cath 01/27/21 with ostial LAD 85% calcification, ostial ramus 70-80% calcification, ostial circumflex 50-70% calcification, prox to mid circumflex 90% occlusion (pt did not want CABG), and LV thrombus. Pt was seen on 5/6 where BP was elevated at 170/92 and Entresto dose was increased to 97-103mg  BID. BP was 172/114 at PCP on 03/25/21 and pt was given dose of clonidine in office. Msg sent to cardiology and pt was advised to increase spironolactone to 25mg  and again increase Entresto dose.  Pt presents today for BP/CHF med management. He has not started higher dose of Entresto yet because rx was not sent to correct pharmacy. Pt is uninsured and receives all of his branded meds from the manufacturer. Rx was corrected 2-3 days ago. He has been on the higher dose of spironolactone for the last 2 days. Reports tolerating meds well. Denies dizziness, swelling, headaches, blurred vision, and dizziness. Keeps a detailed daily log of BP, HR, and weight. HR 65-70, weight 132-134 lbs, BP 165/85 - 192/114. Sometimes uses bicep or wrist cuff. Checks readings at 1-2pm. Reports medication adherence, took meds this AM. Takes BID meds at 8am and 7pm. Avoids NSAIDs, doesn't add salt to food, does drink 4 sodas each day.  Current HTN/CHF meds:  Entresto 97-103mg  BID Carvedilol 6.25mg  BID Spironolactone 25mg  daily [name] 10mg  daily  BP goal: <130/70mmHg  Family History: not on file  Social History: Denies tobacco and drug use, drinks 6 drinks per week.  Diet: Doesn't have a big appetite, bakes food, avoids bacon. 4 cans of soda a day (root beer)  Exercise: Sit ups   Home BP readings: 165/85 - 192/114  Labs: 01/24/21: TC 185, TG 71, HDL 86, LDL 85  (atorvastatin 20mg  daily) 03/12/21: SCr 1.15, Na 137, K 4.7  Wt Readings from Last 3 Encounters:  03/25/21 135 lb 6.4 oz (61.4 kg)  02/25/21 135 lb (61.2 kg)  02/19/21 134 lb (60.8 kg)   BP Readings from Last 3 Encounters:  03/25/21 (!) 172/114  02/25/21 (!) 184/120  02/19/21 (!) 170/92   Pulse Readings from Last 3 Encounters:  03/25/21 68  02/25/21 69  02/19/21 66    Renal function: Estimated Creatinine Clearance: 59.3 mL/min (by C-G formula based on SCr of 1.15 mg/dL).  Past Medical History:  Diagnosis Date   Asthma due to seasonal allergies     Current Outpatient Medications on File Prior to Visit  Medication Sig Dispense Refill   apixaban (ELIQUIS) 5 MG TABS tablet Take 1 tablet (5 mg total) by mouth 2 (two) times daily. 60 tablet 5   aspirin 81 MG EC tablet Take 1 tablet (81 mg total) by mouth daily. Swallow whole. 30 tablet 0   atorvastatin (LIPITOR) 20 MG tablet Take 1 tablet (20 mg total) by mouth daily. 90 tablet 3   carvedilol (COREG) 6.25 MG tablet Take 1 tablet (6.25 mg total) by mouth 2 (two) times daily with a meal. 180 tablet 3   dapagliflozin propanediol ([name]) 10 MG TABS tablet Take 1 tablet (10 mg total)  by mouth daily before breakfast. 30 tablet 0   fexofenadine (ALLEGRA) 180 MG tablet Take 180 mg by mouth as needed for allergies or rhinitis.     FLUoxetine (PROZAC) 20 MG capsule Take 1 capsule (20 mg total) by mouth daily. 30 capsule 2   sacubitril-valsartan (ENTRESTO) 97-103 MG Take 1 tablet by mouth 2 (two) times daily. 180 tablet 3   spironolactone (ALDACTONE) 25 MG tablet Take 1 tablet (25 mg total) by mouth daily. 90 tablet 3   No current facility-administered medications on file prior to visit.    Allergies  Allergen Reactions   Chocolate    Coconut (Cocos Nucifera) Allergy Skin Test      Assessment/Plan:  1. CHF/HTN - BP very elevated in clinic today above goal <130/31mmHg. Pt still taking Entresto 49-51mg  BID since rx with new dose was  not sent into manufacturer until 2 days ago. Advised him to double up on his current Entresto dose to equal 97-103mg  BID until new rx arrives. Will also increase carvedilol to 12.5mg  BID. Continue spironolactone 25mg  daily (dose was increased 2 days ago). Continue [name] 10mg  daily. Pt encouraged to change to diet, caffeine-free soda, and to use bicep cuff for home BP checks. Recheck BP and BMET in 1 week. Will plan to add Bidil at that time (pt assistance form: https://s3.amazonaws.com/rxassistorg/docs/arbor-frm.pdf)  2. Hyperlipidemia - LDL 85 on atorvastatin 20mg  daily, above goal < 55 due to premature CAD and CVA. Will increase atorvastatin to 80mg  daily.  [name] E. Trine Fread, PharmD, BCACP, CPP [facility] [address] [place],  Phone: [phone]; Fax: [phone] 04/01/2021 9:36 AM

## 2021-04-01 NOTE — Patient Instructions (Addendum)
Increase your Entresto from 49-51mg  to 97-103mg  twice daily. You can double up on your current dose and take 2 tablets twice daily. Make sure the manufacturer sends out the higher dose and when they do, resume taking 1 tablet twice a day.  Increase your carvedilol from 6.25mg  to 12.5mg  twice daily. You can do the same thing and double up on your current dose to take 2 tablets twice a day until you run out. Then when you pick up the new prescription for the higher dose, resume taking 1 tablet twice daily  Stay on the 1 tablet of spironolactone each day  Increase your atorvastatin from 20mg  to 80mg  once daily  Continue to monitor your blood pressure (using a bicep cuff), heart rate, and weight at home  Change to diet, caffeine free soda  Limit your daily sodium to < 2,000mg    Recheck lab work and blood pressure next Thursday 6/23 at 11:30am

## 2021-04-02 ENCOUNTER — Other Ambulatory Visit: Payer: Self-pay

## 2021-04-08 ENCOUNTER — Other Ambulatory Visit: Payer: Self-pay

## 2021-04-08 ENCOUNTER — Encounter (INDEPENDENT_AMBULATORY_CARE_PROVIDER_SITE_OTHER): Payer: Self-pay

## 2021-04-08 ENCOUNTER — Ambulatory Visit (INDEPENDENT_AMBULATORY_CARE_PROVIDER_SITE_OTHER): Payer: Self-pay | Admitting: Pharmacist

## 2021-04-08 VITALS — BP 162/104 | HR 70 | Wt 130.5 lb

## 2021-04-08 DIAGNOSIS — I5043 Acute on chronic combined systolic (congestive) and diastolic (congestive) heart failure: Secondary | ICD-10-CM

## 2021-04-08 DIAGNOSIS — E7849 Other hyperlipidemia: Secondary | ICD-10-CM

## 2021-04-08 LAB — BASIC METABOLIC PANEL
BUN/Creatinine Ratio: 7 — ABNORMAL LOW (ref 10–24)
BUN: 7 mg/dL — ABNORMAL LOW (ref 8–27)
CO2: 23 mmol/L (ref 20–29)
Calcium: 9.1 mg/dL (ref 8.6–10.2)
Chloride: 101 mmol/L (ref 96–106)
Creatinine, Ser: 1.07 mg/dL (ref 0.76–1.27)
Glucose: 99 mg/dL (ref 65–99)
Potassium: 3.6 mmol/L (ref 3.5–5.2)
Sodium: 136 mmol/L (ref 134–144)
eGFR: 79 mL/min/{1.73_m2} (ref >59)

## 2021-04-08 MED ORDER — CARVEDILOL 25 MG PO TABS
25.0000 mg | ORAL_TABLET | Freq: Two times a day (BID) | ORAL | 3 refills | Status: DC
  Filled 2021-04-08: qty 60, 30d supply, fill #0
  Filled 2021-05-31: qty 60, 30d supply, fill #1
  Filled 2021-06-28: qty 60, 30d supply, fill #2
  Filled 2021-08-02: qty 60, 30d supply, fill #3
  Filled 2021-09-06: qty 60, 30d supply, fill #4
  Filled 2021-10-12: qty 60, 30d supply, fill #5
  Filled 2021-11-18: qty 60, 30d supply, fill #0
  Filled 2021-12-23: qty 60, 30d supply, fill #1
  Filled 2022-01-26: qty 60, 30d supply, fill #2

## 2021-04-08 NOTE — Progress Notes (Signed)
Patient ID: Todd Vega                 DOB: 01-Jan-1961                      MRN: [id number]     HPI: Todd Vega is a 60 y.o. male referred by Dr. Elease Hashimoto to pharmacy clinic for BP management. PMH is significant for CVA, CHF with LVEF < 20% on 01/26/21 echo, CAD - cath 01/27/21 with ostial LAD 85% calcification, ostial ramus 70-80% calcification, ostial circumflex 50-70% calcification, prox to mid circumflex 90% occlusion (pt did not want CABG), and LV thrombus. Pt was seen on 5/6 where BP was elevated at 170/92 and Entresto dose was increased to 97-103mg  BID. BP was 172/114 at PCP on 03/25/21 and pt was given dose of clonidine in office. Msg sent to cardiology and pt was advised to increase spironolactone to 25mg  and again increase Entresto dose. I saw pt on 04/01/21. He had not started highest dose of Entresto yet because rx was not sent to pt assistance where he receives the medication from. Pt is uninsured and all branded meds come from the manufacturer through patient assistance programs. I increased his Entresto to 97-103mg  BID and increased carvedilol to 12.5mg  BID.  Pt presents today in good spirits for BP/CHF med management with his sister. Pt is uninsured and receives all of his branded meds from the manufacturer. Reports tolerating meds well. Denies dizziness, swelling, headaches, blurred vision, dizziness, chest pain, and SOB. Keeps a detailed daily log of BP, HR, and weight. HR 60-73, weight 130-132 lbs, BP improved to 136/82 - 168/96 using new Omron bicep cuff he purchased since his visit last week. Checks readings at 1-2pm. Reports medication adherence, took meds this AM. Takes BID meds at 8am and 7pm. Avoids NSAIDs, doesn't add salt to food, does drink 4 sodas each day but has changed to diet/decaf soda since visit last week.   Med discrepancy noted: pt has accidentally been taking 4 Entresto 49-51mg  tablets twice daily for the past week.  Home cuff: 198/117, recheck 169/88 Clinic cuff:  190/102, recheck 162/104  Current HTN/CHF meds:  Entresto 97-103mg  BID - doubled up on dose for past week accidentally Carvedilol 12.5mg  BID Spironolactone 25mg  daily [name] 10mg  daily  BP goal: <130/34mmHg  Family History: not on file  Social History: Denies tobacco and drug use, drinks 6 drinks per week.  Diet: Doesn't have a big appetite, bakes food, avoids bacon. Was drinking 4 cans of soda a day (root beer) - changed to diet/caffeine free aftelast visit  Exercise: Sit ups   Labs: 01/24/21: TC 185, TG 71, HDL 86, LDL 85 (atorvastatin 20mg  daily) 03/12/21: SCr 1.15, Na 137, K 4.7  Wt Readings from Last 3 Encounters:  04/01/21 133 lb (60.3 kg)  03/25/21 135 lb 6.4 oz (61.4 kg)  02/25/21 135 lb (61.2 kg)   BP Readings from Last 3 Encounters:  04/01/21 (!) 194/106  03/25/21 (!) 172/114  02/25/21 (!) 184/120   Pulse Readings from Last 3 Encounters:  04/01/21 74  03/25/21 68  02/25/21 69    Renal function: CrCl cannot be calculated (Patient's most recent lab result is older than the maximum 21 days allowed.).  Past Medical History:  Diagnosis Date   Asthma due to seasonal allergies     Current Outpatient Medications on File Prior to Visit  Medication Sig Dispense Refill   apixaban (ELIQUIS) 5 MG TABS tablet Take 1 tablet (5  mg total) by mouth 2 (two) times daily. 60 tablet 5   aspirin 81 MG EC tablet Take 1 tablet (81 mg total) by mouth daily. Swallow whole. 30 tablet 0   atorvastatin (LIPITOR) 80 MG tablet Take 1 tablet (80 mg total) by mouth daily. 90 tablet 3   carvedilol (COREG) 12.5 MG tablet Take 1 tablet (12.5 mg total) by mouth 2 (two) times daily with a meal. 180 tablet 3   dapagliflozin propanediol ([name]) 10 MG TABS tablet Take 1 tablet (10 mg total) by mouth daily before breakfast. 30 tablet 0   fexofenadine (ALLEGRA) 180 MG tablet Take 180 mg by mouth as needed for allergies or rhinitis.     FLUoxetine (PROZAC) 20 MG capsule Take 1 capsule (20 mg  total) by mouth daily. 30 capsule 2   sacubitril-valsartan (ENTRESTO) 97-103 MG Take 1 tablet by mouth 2 (two) times daily. 180 tablet 3   spironolactone (ALDACTONE) 25 MG tablet Take 1 tablet (25 mg total) by mouth daily. 90 tablet 3   No current facility-administered medications on file prior to visit.    Allergies  Allergen Reactions   Chocolate    Coconut (Cocos Nucifera) Allergy Skin Test      Assessment/Plan:  1. CHF/HTN - BP remains elevated in clinic today above goal <130/64mmHg. Pt has also been taking double the prescribed dose of Entresto (has been taking 4 tablets of the 49-51mg  BID for the past week instead of 2 tabs BID before starting 1 tab of 97-103mg  BID). Checking BMET today. Pt advised to take total of Entresto 97-103mg  BID. Will increase carvedilol to 25mg  BID. May increase spironolactone pending BMET for further BP control. Continue [name] 10mg  daily. Pt changed to diet, caffeine-free soda last week and purchased new bicep Omron BP cuff and has been keeping daily detailed log of BP, HR, and weight. Encouraged him to continue with this. He also filled out pt assistance form for Bidil today and is aware to drop off tax return from from last year that's needed to process application. Will schedule f/u visit once BMET results and med plan is finalized.  2. Hyperlipidemia - LDL 85 on atorvastatin 20mg  daily, above goal < 55 due to premature CAD and CVA. I increased atorvastatin to 80mg  daily at visit last week. Pt confirms taking and tolerating well. Will plan to recheck labs in another 6 weeks.  Hamna Asa E. Margaretha Mahan, PharmD, BCACP, CPP [facility] [address], [place],  Phone: [phone]; Fax: [phone] 04/08/2021 7:52 AM

## 2021-04-08 NOTE — Patient Instructions (Addendum)
It was nice to see you today!  Your blood pressure goal is < 130/59mmHg  We will check lab work today and I'll call you tomorrow with results  INCREASE your carvedilol from 12.5mg  to 25mg  twice daily. Use up the rest of your current prescription - you can take 2 of the 12.5mg  tablets twice a day until you run out. Then, pick up the new prescription for the higher 25mg  dose and take 1 tablet twice daily  Your should equal 97-103mg  twice daily. If you are still using the 49-51mg  tablet strength, take 2 tablets twice a day. When you start using the 97-103mg  dose, take 1 tablet twice a day  I'll submit patient assistance for a medication called Bidil that lowers your blood pressure and improves the pumping function of your heart. Look for your tax return form from last year and either email it to me at [email or web address] or drop it off at the front desk and tell them to give the tax return to the pharmacist

## 2021-04-09 ENCOUNTER — Telehealth: Payer: Self-pay | Admitting: Pharmacist

## 2021-04-09 ENCOUNTER — Other Ambulatory Visit: Payer: Self-pay

## 2021-04-09 MED ORDER — SPIRONOLACTONE 50 MG PO TABS
50.0000 mg | ORAL_TABLET | Freq: Every day | ORAL | 5 refills | Status: DC
  Filled 2021-04-09: qty 30, 30d supply, fill #0
  Filled 2021-05-24: qty 30, 30d supply, fill #1
  Filled 2021-06-22: qty 30, 30d supply, fill #2
  Filled 2021-07-26: qty 30, 30d supply, fill #3
  Filled 2021-08-23: qty 30, 30d supply, fill #4
  Filled 2021-10-04: qty 30, 30d supply, fill #5

## 2021-04-09 NOTE — Telephone Encounter (Signed)
Left message for pt to discuss lab results.  BMET from yesterday's visit is stable, SCr actually slightly lower and K surprisingly decreased from 4.7 to 3.6 after starting spironolactone 25mg  and increasing Entresto to 97-103mg  BID (which pt had accidentally been doubling his dose of for the past week).  Will increase spironolactone to 50mg  daily for further BP control. He will continue on Entresto 97-103mg  BID, [name] 10mg  daily, and higher dose of carvedilol 25mg  BID that was increased at yesterday's visit. Will remind pt to bring in proof of income for Bidil pt assistance forms.  Will schedule f/u appt in 2 weeks for BP check and BMET.

## 2021-04-09 NOTE — Telephone Encounter (Signed)
Patient returned call. Reviewed lab results with him. Advised he is to increase spironolactone to 50mg . He may take 2 of the 25mg  tablets, but I would also send in a new Rx for the 50mg  tablets. Increase carvedilol to 25mg  BID as discussed yesterday. Continue Entresto 97/103mg  BID and [name]. Reminded to bring his proof of income for Bidil pt assistance. Scheduled for the next available AM apt on 7/14.

## 2021-04-26 ENCOUNTER — Telehealth: Payer: Self-pay | Admitting: Cardiovascular Disease

## 2021-04-26 ENCOUNTER — Other Ambulatory Visit: Payer: Self-pay

## 2021-04-26 NOTE — Telephone Encounter (Signed)
Patient niece called to say that the patient is supposed to MRI of his heart before he see the surgeon on the 19th. Niece states he had one but he needs another one. Please advise

## 2021-04-26 NOTE — Telephone Encounter (Signed)
Will forward to Dr. Elease Hashimoto for advisement.

## 2021-04-28 ENCOUNTER — Other Ambulatory Visit: Payer: Self-pay

## 2021-04-28 NOTE — Telephone Encounter (Signed)
Left message for patient's niece. Note has been placed in the appointment note for MRI to be ordered. Advised niece to call back with any further questions.

## 2021-04-29 ENCOUNTER — Ambulatory Visit (INDEPENDENT_AMBULATORY_CARE_PROVIDER_SITE_OTHER): Payer: Self-pay | Admitting: Pharmacist

## 2021-04-29 ENCOUNTER — Other Ambulatory Visit: Payer: Self-pay

## 2021-04-29 VITALS — BP 170/102 | HR 65 | Wt 127.2 lb

## 2021-04-29 DIAGNOSIS — I5043 Acute on chronic combined systolic (congestive) and diastolic (congestive) heart failure: Secondary | ICD-10-CM

## 2021-04-29 DIAGNOSIS — E7849 Other hyperlipidemia: Secondary | ICD-10-CM

## 2021-04-29 DIAGNOSIS — I10 Essential (primary) hypertension: Secondary | ICD-10-CM

## 2021-04-29 LAB — COMPREHENSIVE METABOLIC PANEL
ALT: 12 IU/L (ref 0–44)
AST: 14 IU/L (ref 0–40)
Albumin/Globulin Ratio: 1.1 — ABNORMAL LOW (ref 1.2–2.2)
Albumin: 4 g/dL (ref 3.8–4.9)
Alkaline Phosphatase: 97 IU/L (ref 44–121)
BUN/Creatinine Ratio: 13 (ref 10–24)
BUN: 14 mg/dL (ref 8–27)
Bilirubin Total: 0.7 mg/dL (ref 0.0–1.2)
CO2: 24 mmol/L (ref 20–29)
Calcium: 9.2 mg/dL (ref 8.6–10.2)
Chloride: 95 mmol/L — ABNORMAL LOW (ref 96–106)
Creatinine, Ser: 1.09 mg/dL (ref 0.76–1.27)
Globulin, Total: 3.7 g/dL (ref 1.5–4.5)
Glucose: 98 mg/dL (ref 65–99)
Potassium: 4 mmol/L (ref 3.5–5.2)
Sodium: 134 mmol/L (ref 134–144)
Total Protein: 7.7 g/dL (ref 6.0–8.5)
eGFR: 78 mL/min/{1.73_m2} (ref >59)

## 2021-04-29 LAB — LIPID PANEL
Chol/HDL Ratio: 2.9 ratio (ref 0.0–5.0)
Cholesterol, Total: 135 mg/dL (ref 100–199)
HDL: 46 mg/dL (ref >39)
LDL Chol Calc (NIH): 74 mg/dL (ref 0–99)
Triglycerides: 77 mg/dL (ref 0–149)
VLDL Cholesterol Cal: 15 mg/dL (ref 5–40)

## 2021-04-29 NOTE — Patient Instructions (Addendum)
It was nice to see you today!  Your blood pressure goal is < 130/39mmHg  Your blood pressure was 170/102 today, heart rate 65, weight 127 lb 4 oz  Continue taking your current medications  I will submit patient assistance for Bidil. This is a medication taken 3 times a day that lowers your blood pressure and helps to improve your ejection fraction (pumping function of your heart).  I'll give you a call when I hear back from patient assistance

## 2021-04-29 NOTE — Progress Notes (Signed)
Patient ID: Todd Vega                 DOB: [birth year]                      MRN: [id number]     HPI: Todd Vega is a 60 y.o. male referred by Dr. [name] to pharmacy clinic for BP management. PMH is significant for CVA, CHF with LVEF < 20% on 01/26/21 echo, CAD - cath 01/27/21 with ostial LAD 85% calcification, ostial ramus 70-80% calcification, ostial circumflex 50-70% calcification, prox to mid circumflex 90% occlusion (pt did not want CABG), and LV thrombus. Pt was seen on 5/6 where BP was elevated at 170/92 and Entresto dose was increased to 97-103mg  BID. BP was 172/114 at PCP on 03/25/21 and pt was given dose of clonidine in office. Msg sent to cardiology and pt was advised to increase spironolactone to 25mg  and again increase Entresto dose. I saw pt on 04/01/21. He had not started highest dose of Entresto yet because rx was not sent to pt assistance where he receives the medication from. Pt is uninsured and all branded meds come from the manufacturer through patient assistance programs. His BP was very elevated at that visit at 194/106. Over my last few visits with pt, I have increased his Entresto to 97-103mg  BID, increased carvedilol to 12.5mg  BID, and increased spironolactone to 50mg  daily. He presents today for follow up.  Pt presents today in good spirits for BP/CHF med management with his sister and niece. Pt is uninsured and receives all of his branded meds from the manufacturer. Reports tolerating meds well. Denies dizziness, swelling, headaches, blurred vision, dizziness, chest pain, and SOB. Keeps a detailed daily log of BP, HR, and weight. HR 60-70 (most 60-62), weight 125-130 lbs (down from 130-132 lb at last visit), BP 153/94 - 177/99 using new Omron bicep cuff. Checks readings at 1-2pm. Brings in all medications today as well as BP cuff and pill box that his sister helps with. Reports medication adherence, took meds this AM. Takes BID meds at 8am and 7pm. Avoids NSAIDs, doesn't add salt to  food, has cut back from 4 to 2 sodas each day (diet and caffeine free). Tolerating increased dose of atorvastatin. He brings in tax return documents from last year for Bidil pt assistance application.  Home cuff: 184/115 Clinic cuff: 170/102  Current HTN/CHF meds:  Entresto 97-103mg  BID Carvedilol 25mg  BID Spironolactone 50mg  daily [name] 10mg  daily  BP goal: <130/86mmHg  Family History: not on file  Social History: Denies tobacco and drug use, drinks 6 drinks per week.  Diet: Doesn't have a big appetite, bakes food, avoids bacon. Was drinking 4 cans of soda a day (root beer) - changed to diet/caffeine free at last visit  Exercise: Sit ups. Has some 10 lb weights at home he's been using. Trying to not overdo it.  Home BP readings: Home BP checked daily between 1-2pm: 153/92, 162/96, 153/99, 177/99, 151/93, 156/98, 157/92, 170/97, 158/97, 158/99, 160/97, 154/95, 165/109, 164/106, 172/89, 154/94, 153/94  Labs: 01/24/21: TC 185, TG 71, HDL 86, LDL 85 (atorvastatin 20mg  daily) 03/12/21: SCr 1.15, Na 137, K 4.7 04/08/21: SCr 1.07, Na 136, K 3.6  Wt Readings from Last 3 Encounters:  04/08/21 130 lb 8 oz (59.2 kg)  04/01/21 133 lb (60.3 kg)  03/25/21 135 lb 6.4 oz (61.4 kg)   BP Readings from Last 3 Encounters:  04/08/21 (!) 162/104  04/01/21 (!) 194/106  03/25/21 04/10/21)  172/114   Pulse Readings from Last 3 Encounters:  04/08/21 70  04/01/21 74  03/25/21 68    Renal function: CrCl cannot be calculated (Unknown ideal weight.).  Past Medical History:  Diagnosis Date   Asthma due to seasonal allergies     Current Outpatient Medications on File Prior to Visit  Medication Sig Dispense Refill   apixaban (ELIQUIS) 5 MG TABS tablet Take 1 tablet (5 mg total) by mouth 2 (two) times daily. 60 tablet 5   aspirin 81 MG EC tablet Take 1 tablet (81 mg total) by mouth daily. Swallow whole. 30 tablet 0   atorvastatin (LIPITOR) 80 MG tablet Take 1 tablet (80 mg total) by mouth daily. 90  tablet 3   carvedilol (COREG) 25 MG tablet Take 1 tablet (25 mg total) by mouth 2 (two) times daily with a meal. 180 tablet 3   dapagliflozin propanediol ([name]) 10 MG TABS tablet Take 1 tablet (10 mg total) by mouth daily before breakfast. 30 tablet 0   fexofenadine (ALLEGRA) 180 MG tablet Take 180 mg by mouth as needed for allergies or rhinitis.     FLUoxetine (PROZAC) 20 MG capsule Take 1 capsule (20 mg total) by mouth daily. 30 capsule 2   sacubitril-valsartan (ENTRESTO) 97-103 MG Take 1 tablet by mouth 2 (two) times daily. 180 tablet 3   spironolactone (ALDACTONE) 50 MG tablet Take 1 tablet (50 mg total) by mouth daily. 30 tablet 5   No current facility-administered medications on file prior to visit.    Allergies  Allergen Reactions   Chocolate    Coconut (Cocos Nucifera) Allergy Skin Test      Assessment/Plan:  1. CHF/HTN - BP remains elevated in clinic today, still far above goal <130/84mmHg despite CHF med optimization including carvedilol 25mg  BID, Entresto 97-103mg  BID, spironolactone 50mg  daily, and [name] 10mg  daily. Checking BMET today with recent spironolactone dose increase. Pt brought in tax return docs needed for Bidil pt assistance. This has been faxed today. Advised pt I will call when we hear back regarding Bidil. Hopeful that addition of this for CHF will help control BP better.  2. Hyperlipidemia - LDL 85 on atorvastatin 20mg  daily, above goal < 55 due to premature CAD and CVA. I increased atorvastatin to 80mg  daily at visit a month ago. Will recheck lipids today.  Todd Vega E. Todd Vega, PharmD, BCACP, CPP [facility] [address], [place],  Phone: [phone]; Fax: [phone] 04/29/2021 7:38 AM

## 2021-04-30 ENCOUNTER — Telehealth: Payer: Self-pay | Admitting: Pharmacist

## 2021-04-30 ENCOUNTER — Other Ambulatory Visit: Payer: Self-pay

## 2021-04-30 MED ORDER — EZETIMIBE 10 MG PO TABS
10.0000 mg | ORAL_TABLET | Freq: Every day | ORAL | 3 refills | Status: DC
  Filled 2021-04-30: qty 30, 30d supply, fill #0
  Filled 2021-05-17: qty 30, 30d supply, fill #1
  Filled 2021-06-14: qty 30, 30d supply, fill #2
  Filled 2021-07-26: qty 30, 30d supply, fill #3
  Filled 2021-08-23: qty 30, 30d supply, fill #4
  Filled 2021-09-20: qty 30, 30d supply, fill #5
  Filled 2021-10-18: qty 30, 30d supply, fill #6
  Filled 2021-11-18: qty 30, 30d supply, fill #0
  Filled 2022-03-29: qty 30, 30d supply, fill #1

## 2021-04-30 NOTE — Telephone Encounter (Addendum)
BMET stable on recent dose increase of spironolactone. Pt optimized on CHF meds including carvedilol 25mg  BID, Entresto 97-103mg  BID, spironolactone 50mg  daily, and [name] 10mg  daily. Pt assistance for Bidil submitted yesterday for additional CHF and BP benefit (BP still elevated at visit yesterday). Will reach out to pt once I hear back regarding this. He will continue current CHF meds.  LDL improved slightly from 85 to 74 after increasing atorvastatin from 20mg  to 80mg  daily. Still remains above goal < 55 due to hx of premature CAD and CVA. Will add on ezetimibe 10mg  daily. Pt is agreeable with medication plan.

## 2021-05-01 ENCOUNTER — Encounter: Payer: Self-pay | Admitting: Cardiovascular Disease

## 2021-05-01 NOTE — Progress Notes (Signed)
Cardiology Office Note:    Date:  05/03/2021   ID:  Todd Vega, DOB August 02, 1961, MRN [id number]  PCP:  Kerin Perna, NP   [facility] Providers Cardiologist:  Mertie Moores, MD {    Referring MD: Kerin Perna, NP   Chief Complaint  Patient presents with   Congestive Heart Failure    May 03, 2021   Todd Vega is a 60 y.o. male with a hx of CVA , CHF Seen with Larene Beach ( niece )  I met him in April 2022 for eval of his CHF Echo showed EF 20-25%. Cath revealed severe CAD  Echo revealed LV thrombus Was seen by Dr. Roxy Manns and CABG was discussed He did not want CABG at that time I saw him in May, 2022 and he still did not want to consider CABG. 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)    Past Medical History:  Diagnosis Date   Asthma due to seasonal allergies     Past Surgical History:  Procedure Laterality Date   BUBBLE STUDY  01/25/2021   Procedure: BUBBLE STUDY;  Surgeon: Buford Dresser, MD;  Location: [place];  Service: Cardiovascular;;   RIGHT HEART CATH AND CORONARY ANGIOGRAPHY N/A 01/27/2021   Procedure: RIGHT HEART CATH AND CORONARY ANGIOGRAPHY;  Surgeon: Belva Crome, MD;  Location: [place] [facility];  Service: Cardiovascular;  Laterality: N/A;   TEE WITHOUT CARDIOVERSION N/A 01/25/2021   Procedure: TRANSESOPHAGEAL ECHOCARDIOGRAM (TEE);  Surgeon: Buford Dresser, MD;  Location: [facility] ENDOSCOPY;  Service: Cardiovascular;  Laterality: N/A;    Current Medications: Current Meds  Medication Sig   apixaban (ELIQUIS) 5 MG TABS tablet Take 1 tablet (5 mg total) by mouth 2 (two) times daily.   aspirin 81 MG EC tablet Take 1 tablet (81 mg total) by mouth daily. Swallow whole.   atorvastatin (LIPITOR) 80 MG  tablet Take 1 tablet (80 mg total) by mouth daily.   carvedilol (COREG) 25 MG tablet Take 1 tablet (25 mg total) by mouth 2 (two) times daily with a meal.   dapagliflozin propanediol ([name]) 10 MG TABS tablet Take 1 tablet (10 mg total) by mouth daily before breakfast.   ezetimibe (ZETIA) 10 MG tablet Take 1 tablet (10 mg total) by mouth daily.   fexofenadine (ALLEGRA) 180 MG tablet Take 180 mg by mouth as needed for allergies or rhinitis.   FLUoxetine (PROZAC) 20 MG capsule Take 1 capsule (20 mg total) by mouth daily.   hydrALAZINE (APRESOLINE) 25 MG tablet Take 1 tablet (25 mg total) by mouth 3 (three) times daily.   isosorbide mononitrate (IMDUR) 30 MG 24 hr tablet Take 1 tablet (30 mg total) by mouth daily.   sacubitril-valsartan (ENTRESTO) 97-103 MG Take 1 tablet by mouth 2 (two) times daily.   spironolactone (ALDACTONE) 50 MG tablet Take 1 tablet (50 mg total) by mouth daily.     Allergies:   Chocolate and Coconut (cocos nucifera) allergy skin test   Social History   Socioeconomic History   Marital status: Divorced    Spouse name: Not on file   Number of children: 0   Years of education: Not on file   Highest education level: Some  college, no degree  Occupational History   Not on file  Tobacco Use   Smoking status: Never   Smokeless tobacco: Never  Vaping Use   Vaping Use: Never used  Substance and Sexual Activity   Alcohol use: Yes    Alcohol/week: 6.0 standard drinks    Types: 6 Cans of beer per week   Drug use: Never   Sexual activity: Not on file  Other Topics Concern   Not on file  Social History Narrative   Not on file   Social Determinants of Health   Financial Resource Strain: Low Risk    Difficulty of Paying Living Expenses: Not very hard  Food Insecurity: No Food Insecurity   Worried About Running Out of Food in the Last Year: Never true   Ran Out of Food in the Last Year: Never true  Transportation Needs: No Transportation Needs   Lack of  Transportation (Medical): No   Lack of Transportation (Non-Medical): No  Physical Activity: Not on file  Stress: Not on file  Social Connections: Not on file     Family History: The patient's family history is not on file.  ROS:   Please see the history of present illness.     All other systems reviewed and are negative.  EKGs/Labs/Other Studies Reviewed:    The following studies were reviewed today:    EKG:     Recent Labs: 01/23/2021: TSH 1.221 01/26/2021: Magnesium 2.0 04/29/2021: ALT 12; BUN 14; Creatinine, Ser 1.09; Potassium 4.0; Sodium 134 05/03/2021: Hemoglobin 12.0; Platelets 270  Recent Lipid Panel    Component Value Date/Time   CHOL 135 04/29/2021 0905   TRIG 77 04/29/2021 0905   HDL 46 04/29/2021 0905   CHOLHDL 2.9 04/29/2021 0905   CHOLHDL 2.2 01/24/2021 0228   VLDL 14 01/24/2021 0228   LDLCALC 74 04/29/2021 0905     Risk Assessment/Calculations:           Physical Exam:    VS:  BP (!) 144/96   Pulse 67   Ht $R'5\' 5"'gp$  (1.651 m)   Wt 128 lb (58.1 kg)   SpO2 96%   BMI 21.30 kg/m     Wt Readings from Last 3 Encounters:  05/03/21 128 lb (58.1 kg)  04/29/21 127 lb 4 oz (57.7 kg)  04/08/21 130 lb 8 oz (59.2 kg)     GEN:  chronically ill appearing male  HEENT: Normal NECK: No JVD; No carotid bruits LYMPHATICS: No lymphadenopathy CARDIAC: RR  RESPIRATORY:  Clear to auscultation without rales, wheezing or rhonchi  ABDOMEN: Soft, non-tender, non-distended MUSCULOSKELETAL:  No edema; No deformity  SKIN: Warm and dry NEUROLOGIC:  Alert and oriented x 3 PSYCHIATRIC:  Normal affect   ASSESSMENT:    1. Acute on chronic combined systolic and diastolic heart failure ([place])   2. LV (left ventricular) mural thrombus   3. Primary hypertension    PLAN:       CAD :  needs CABG. he has seen the cardiac surgeons.  He needs a cardiac viability study.  We will order a cardiac MRI to look for viability.  Fortunately is not having any episodes of angina.  2.   Acute on chronic combined systolic and diastolic congestive heart failure.  His last echocardiogram showed an ejection fraction of 20 to 25%.  He had an LV thrombus.  He has been on Eliquis.  We will repeat his echocardiogram to look for any signs of LV thrombus and also to assess his  LV function.  We will see him back in the office in approximately 2 months.  He will need to make an appointment with the surgeons following his cardiac MRI.      Medication Adjustments/Labs and Tests Ordered: Current medicines are reviewed at length with the patient today.  Concerns regarding medicines are outlined above.  Orders Placed This Encounter  Procedures   MR CARDIAC MORPHOLOGY W WO CONTRAST   CBC   ECHOCARDIOGRAM COMPLETE   Meds ordered this encounter  Medications   hydrALAZINE (APRESOLINE) 25 MG tablet    Sig: Take 1 tablet (25 mg total) by mouth 3 (three) times daily.    Dispense:  270 tablet    Refill:  3   isosorbide mononitrate (IMDUR) 30 MG 24 hr tablet    Sig: Take 1 tablet (30 mg total) by mouth daily.    Dispense:  90 tablet    Refill:  3     Patient Instructions  Medication Instructions:  Your physician has recommended you make the following change in your medication:   Begin hydralazine $RemoveBeforeDEI'25mg'sCDZocKXlULWiovJ$  tablet, three times per day Begin Imdur, $RemoveBefo'30mg'ipEjElNKhKb$  tablet, once daily  Labwork: Your physician recommends that you return for lab work in:   CBC   Testing/Procedures: Your physician has requested that you have a cardiac MRI. Cardiac MRI uses a computer to create images of your heart as its beating, producing both still and moving pictures of your heart and major blood vessels. For further information please visit [email or web address] Please follow the instruction sheet given to you today for more information.  Your physician has requested that you have an echocardiogram. Echocardiography is a painless test that uses sound waves to create images of your heart. It provides your doctor with  information about the size and shape of your heart and how well your heart's chambers and valves are working. This procedure takes approximately one hour. There are no restrictions for this procedure.   Follow-Up: Your physician recommends that you schedule a follow-up appointment in:   Sept 23 @ 11:40am with Dr. Acie Fredrickson  Any Other Special Instructions Will Be Listed Below (If Applicable).     If you need a refill on your cardiac medications before your next appointment, please call your pharmacy.   Signed, Mertie Moores, MD  05/03/2021 5:54 PM    [place]

## 2021-05-03 ENCOUNTER — Other Ambulatory Visit: Payer: Self-pay

## 2021-05-03 ENCOUNTER — Ambulatory Visit: Payer: Self-pay | Admitting: Thoracic Surgery (Cardiothoracic Vascular Surgery)

## 2021-05-03 ENCOUNTER — Encounter: Payer: Self-pay | Admitting: Cardiovascular Disease

## 2021-05-03 ENCOUNTER — Ambulatory Visit (INDEPENDENT_AMBULATORY_CARE_PROVIDER_SITE_OTHER): Payer: Self-pay | Admitting: Cardiovascular Disease

## 2021-05-03 VITALS — BP 144/96 | HR 67 | Ht 65.0 in | Wt 128.0 lb

## 2021-05-03 DIAGNOSIS — I5043 Acute on chronic combined systolic (congestive) and diastolic (congestive) heart failure: Secondary | ICD-10-CM

## 2021-05-03 DIAGNOSIS — I10 Essential (primary) hypertension: Secondary | ICD-10-CM

## 2021-05-03 DIAGNOSIS — I513 Intracardiac thrombosis, not elsewhere classified: Secondary | ICD-10-CM

## 2021-05-03 LAB — CBC
Hematocrit: 35.3 % — ABNORMAL LOW (ref 37.5–51.0)
Hemoglobin: 12 g/dL — ABNORMAL LOW (ref 13.0–17.7)
MCH: 24.7 pg — ABNORMAL LOW (ref 26.6–33.0)
MCHC: 34 g/dL (ref 31.5–35.7)
MCV: 73 fL — ABNORMAL LOW (ref 79–97)
Platelets: 270 10*3/uL (ref 150–450)
RBC: 4.86 x10E6/uL (ref 4.14–5.80)
RDW: 14.6 % (ref 11.6–15.4)
WBC: 4 10*3/uL (ref 3.4–10.8)

## 2021-05-03 MED ORDER — HYDRALAZINE HCL 25 MG PO TABS
25.0000 mg | ORAL_TABLET | Freq: Three times a day (TID) | ORAL | 3 refills | Status: DC
Start: 2021-05-03 — End: 2021-05-10
  Filled 2021-05-03: qty 90, 30d supply, fill #0

## 2021-05-03 MED ORDER — ISOSORBIDE MONONITRATE ER 30 MG PO TB24
30.0000 mg | ORAL_TABLET | Freq: Every day | ORAL | 3 refills | Status: DC
Start: 2021-05-03 — End: 2021-05-10
  Filled 2021-05-03: qty 30, 30d supply, fill #0

## 2021-05-03 NOTE — Patient Instructions (Addendum)
Medication Instructions:  Your physician has recommended you make the following change in your medication:   Begin hydralazine 25mg  tablet, three times per day Begin Imdur, 30mg  tablet, once daily  Labwork: Your physician recommends that you return for lab work in:   CBC   Testing/Procedures: Your physician has requested that you have a cardiac MRI. Cardiac MRI uses a computer to create images of your heart as its beating, producing both still and moving pictures of your heart and major blood vessels. For further information please visit . Please follow the instruction sheet given to you today for more information.  Your physician has requested that you have an echocardiogram. Echocardiography is a painless test that uses sound waves to create images of your heart. It provides your doctor with information about the size and shape of your heart and how well your heart's chambers and valves are working. This procedure takes approximately one hour. There are no restrictions for this procedure.   Follow-Up: Your physician recommends that you schedule a follow-up appointment in:   Sept 23 @ 11:40am with Dr. [email or web address]  Any Other Special Instructions Will Be Listed Below (If Applicable).     If you need a refill on your cardiac medications before your next appointment, please call your pharmacy.

## 2021-05-04 ENCOUNTER — Encounter (HOSPITAL_COMMUNITY): Payer: Self-pay | Admitting: *Deleted

## 2021-05-04 ENCOUNTER — Telehealth: Payer: Self-pay | Admitting: Cardiovascular Disease

## 2021-05-04 ENCOUNTER — Ambulatory Visit: Payer: Self-pay | Admitting: Thoracic Surgery (Cardiothoracic Vascular Surgery)

## 2021-05-04 NOTE — Telephone Encounter (Signed)
Pt c/o medication issue:  1. Name of Medication: Vidil  2. How are you currently taking this medication (dosage and times per day)? As prescribed  3. Are you having a reaction (difficulty breathing--STAT)? dizziness  4. What is your medication issue? Pt is calling with concerns that this medication is making him dizzy.

## 2021-05-04 NOTE — Telephone Encounter (Signed)
Pt calling to report some transient dizziness after taking his hydralazine this morning. He states his BP was elevated last night and this morning, but has not taken his BP since taking his AM BP meds. His dizziness subsided shortly after his dizzy spell.   I advised him to monitor his BP prior to his mid day dose. If normal to elevated, go ahead and take his mid-day dose. If dizziness occurs again, take his BP and report back to the office. He was also instructed to take his time changing positions as well.   I will forward to Dr. Elease Hashimoto to review and make further recommendations.

## 2021-05-04 NOTE — Progress Notes (Signed)
Received signed ROI from [facility] requesting records from 01/23/21 to present. Pt only seen in H&V TOC clinic on 02/02/21, that OV note was faxed back to them at [phone]

## 2021-05-05 ENCOUNTER — Telehealth: Payer: Self-pay | Admitting: Cardiovascular Disease

## 2021-05-05 NOTE — Telephone Encounter (Signed)
Spoke with the patient's niece who states that the patients blood pressure has been running low. She reports that he did have an episode of dizziness yesterday. Advised on previous recommendations to take BP and monitor symptoms. Okay to hold dose if BP is low.  Patient's niece states that the patient also was approved for Bidil. She is unsure whether patient has started taking that. I advised her that if he has received Bidil and has started taking it then he should discontinue the hydralazine and the isosorbide. Advised that Bidil is a combination of these two. She will check on his medications after work and make sure he is taking them correctly. She will call back to let us know.

## 2021-05-05 NOTE — Telephone Encounter (Signed)
Pt c/o BP issue: STAT if pt c/o blurred vision, one-sided weakness or slurred speech  1. What are your last 5 BP readings? Yesterday 115/68, today 105/58 105/53  2. Are you having any other symptoms (ex. Dizziness, headache, blurred vision, passed out)? Dizziness yesterday  3. What is your BP issue? Patient's niece states the patient started a new medication, bidil, and is taking it 3 times a day. She states she wants to make sure his BP is not too low. She states yesterday the patient complained of dizzy, but has not had it since and has no other symptoms. She says to either call her cell phone: [phone] or her work: [phone], but her work number may be better.

## 2021-05-07 ENCOUNTER — Telehealth: Payer: Self-pay | Admitting: Cardiovascular Disease

## 2021-05-07 NOTE — Telephone Encounter (Signed)
Pt c/o BP issue: STAT if pt c/o blurred vision, one-sided weakness or slurred speech  1. What are your last 5 BP readings?  Has been stabilizing since she last spoke with the office  05/06/21 8:30 PM 160/74 5:00 PM 158/87 1:30 PM 108/54 (woke up with a headache) pain level dull 8 maybe number 7   2. Are you having any other symptoms (ex. Dizziness, headache, blurred vision, passed out)? Headache off and on since medication changes on Monday   3. What is your BP issue? BP seems to be getting better has been having headaches off and on since recent medication changes. If not available when calling back leave a detailed VM.

## 2021-05-07 NOTE — Telephone Encounter (Signed)
Spoke with niece and pt did not have headache today but instructed if does may take Tylenol as directed Also note B/P values from yesterday afternoon readings creeping up Instructed niece to continue to monitor and if no improvement to call back Will forward to T Dr Elease Hashimoto for review .Zack Seal

## 2021-05-10 ENCOUNTER — Telehealth: Payer: Self-pay | Admitting: Pharmacist

## 2021-05-10 NOTE — Telephone Encounter (Signed)
Pt was approved for Bidil patient assistance. Called him to ensure he hasn't been taking both that and isosorbide/hydralazine that was recently prescribed.  He confirms he is taking Bidil 1 tab TID and never picked up isosorbide/hydralazine from the pharmacy. Dizziness has improved, still having some slight headache, likely from isosorbide component of med. BP readings over past few days as follows:  7/22 - 120/72 7/23 - 130/70 7/24 - 138/71 7/25 - 156/89  Will continue Bidil at current dose and hold off on increasing until pt is tolerating better. He'll continue to keep BP log at home and I'll call him in a few weeks for updated readings.

## 2021-05-10 NOTE — Telephone Encounter (Addendum)
I do not think his BP is related to his headache  Cont cardiac meds  Cont tylenol  Consult wiith primary md if headache continues     Pt advised and reports that he has had some improvement and will continue to monitor if anything worsens will call his PCP.

## 2021-05-11 ENCOUNTER — Telehealth: Payer: Self-pay | Admitting: Cardiovascular Disease

## 2021-05-11 NOTE — Telephone Encounter (Signed)
Todd Vega, RPH spoke with patient on 7/25 and confirmed that he is taking Bidil as prescribed and did not pick up prescriptions for isosorbide and hydralazine. She reviewed BP readings with him and advised him to continue current medications.

## 2021-05-11 NOTE — Telephone Encounter (Signed)
Patient's niece calling to schedule MRI.

## 2021-05-17 ENCOUNTER — Other Ambulatory Visit: Payer: Self-pay

## 2021-05-18 ENCOUNTER — Ambulatory Visit: Payer: Self-pay | Admitting: Thoracic Surgery (Cardiothoracic Vascular Surgery)

## 2021-05-18 DIAGNOSIS — Z736 Limitation of activities due to disability: Secondary | ICD-10-CM

## 2021-05-19 ENCOUNTER — Other Ambulatory Visit: Payer: Self-pay

## 2021-05-20 ENCOUNTER — Other Ambulatory Visit: Payer: Self-pay

## 2021-05-20 ENCOUNTER — Ambulatory Visit (HOSPITAL_COMMUNITY): Payer: Self-pay | Attending: Cardiovascular Disease

## 2021-05-20 DIAGNOSIS — I10 Essential (primary) hypertension: Secondary | ICD-10-CM | POA: Insufficient documentation

## 2021-05-20 DIAGNOSIS — I5043 Acute on chronic combined systolic (congestive) and diastolic (congestive) heart failure: Secondary | ICD-10-CM | POA: Insufficient documentation

## 2021-05-20 DIAGNOSIS — I513 Intracardiac thrombosis, not elsewhere classified: Secondary | ICD-10-CM | POA: Insufficient documentation

## 2021-05-20 LAB — ECHOCARDIOGRAM COMPLETE: S' Lateral: 3.76 cm

## 2021-05-24 ENCOUNTER — Telehealth: Payer: Self-pay | Admitting: Pharmacist

## 2021-05-24 ENCOUNTER — Other Ambulatory Visit: Payer: Self-pay

## 2021-05-24 NOTE — Telephone Encounter (Signed)
Called pt to follow up with home BP readings after being on Bidil for a few weeks. Also taking spironolactone 50mg  daily, [name] 10mg  daily, carvedilol 25mg  BID, and Entresto 97-103mg  BID for his CHF.  8/3 - 142/81 8/4 - 136/66 8/5 - 127/60 8/7 - 119/64  Dizziness and headache have improved the longer he has been on Bidil, dizziness not noticeable anymore, headache is occasional but then resolves on its own.   Will continue current meds for the next 2 weeks as his BP readings have improved notably over the past few days. Will call pt in another 2 weeks with plan to titrate Bidil to 2 tabs TID if able.

## 2021-05-25 ENCOUNTER — Encounter (INDEPENDENT_AMBULATORY_CARE_PROVIDER_SITE_OTHER): Payer: Self-pay | Admitting: Nurse Practitioner

## 2021-05-25 ENCOUNTER — Telehealth: Payer: Self-pay | Admitting: Pharmacist

## 2021-05-25 ENCOUNTER — Ambulatory Visit (INDEPENDENT_AMBULATORY_CARE_PROVIDER_SITE_OTHER): Payer: Self-pay | Admitting: Nurse Practitioner

## 2021-05-25 ENCOUNTER — Other Ambulatory Visit: Payer: Self-pay

## 2021-05-25 VITALS — BP 200/110 | HR 69 | Temp 97.5°F | Ht 65.0 in | Wt 130.4 lb

## 2021-05-25 DIAGNOSIS — F32A Depression, unspecified: Secondary | ICD-10-CM

## 2021-05-25 DIAGNOSIS — I10 Essential (primary) hypertension: Secondary | ICD-10-CM

## 2021-05-25 DIAGNOSIS — F411 Generalized anxiety disorder: Secondary | ICD-10-CM

## 2021-05-25 MED ORDER — ISOSORB DINITRATE-HYDRALAZINE 20-37.5 MG PO TABS
2.0000 | ORAL_TABLET | Freq: Three times a day (TID) | ORAL | 0 refills | Status: DC
  Filled 2021-05-25: qty 180, 30d supply, fill #0

## 2021-05-25 NOTE — Progress Notes (Signed)
Subjective:   Todd Vega is an 60 y.o. male who presents for evaluation and treatment of depressive symptoms.  Onset approximately 3 months ago, rapidly improving since that time.  Current symptoms include  denies current symptoms .  Current treatment for depression:Medication Sleep problems: Absent   Early awakening:Absent   Energy: Excellent Motivation: Excellent Concentration: Excellent Rumination/worrying: Absent Memory: Excellent Tearfulness: Absent  Anxiety: Absent  Panic: Absent  Overall Mood: Greatly improved  Hopelessness: Absent Suicidal ideation: Absent  Other/Psychosocial Stressors: none Family history positive for depression in the patient's sister(s).  Previous treatment modalities employed include None.  Past episodes of depression:none Organic causes of depression present: None.   Note: Patient's BP elevated in office today.  Patient states that he does take his blood pressure daily at home and does bring a log to the office visit today.  He states that his systolic blood pressures are usually between 135-145.  Heart rate has been stable at home.  Patient's home blood pressure machine was checked against a manual blood pressure in the office today and were the same.  Patient's blood pressure discussed with pharmacist and cardiology.  Advised to increase BiDil from 1 tablet 3 times daily to 2 tablets 3 times daily.   Review of Systems Review of Systems  Constitutional: Negative.   HENT: Negative.    Eyes: Negative.   Respiratory: Negative.    Cardiovascular: Negative.   Gastrointestinal: Negative.   Genitourinary: Negative.   Musculoskeletal: Negative.   Skin: Negative.   Neurological: Negative.   Endo/Heme/Allergies: Negative.   Psychiatric/Behavioral: Negative.      Objective:   Mental Status Examination: Posture and motor behavior: Appropriate Dress, grooming, personal hygiene: Appropriate Facial expression: Appropriate Speech: Appropriate Mood:  Appropriate Coherency and relevance of thought: Appropriate Thought content: Appropriate Perceptions: Appropriate Orientation:Appropriate Attention and concentration: Appropriate Memory: : Appropriate Information: Not examined Vocabulary: Appropriate Abstract reasoning: Appropriate Judgment: Appropriate    Assessment:   Experiencing the following symptoms of depression most of the day nearly every day for more than two consecutive weeks: depressed mood  Depressive Disorder:anxiety and depression  Suicide Risk Assessment:  Suicidal intent: none Suicidal plan: none Access to means for suicide: none Lethality of means for suicide: none Prior suicide attempts: none Recent exposure to suicide:none   Plan:   No diagnosis found.  Reviewed concept of depression as biochemical imbalance of neurotransmitters and rationale for treatment. Instructed patient to contact office or on-call physician promptly should condition worsen or any new symptoms appear and provided on-call telephone numbers.

## 2021-05-25 NOTE — Telephone Encounter (Signed)
Received message from NP at PCP office today that pt's BP was 200/110 even after taking his meds, home cuff measured similarly as well. Advised her to increase pt's Bidil from 1 tab to 2 tabs TID. He already takes Entresto 97-103mg  BID, [name] 10mg  daily, carvedilol 25mg  BID, and spironolactone 50mg  daily for his CHF.  Called pt, he states he may have been nervous in the office. Checked BP when he got home, it had improved to 155/88. Pt aware to continue on higher dose of Bidil 2 tabs BID. I'll call him in another 2 weeks to follow up with home BP readings. If needed, will further increase spironolactone to 100mg  daily.

## 2021-05-25 NOTE — Patient Instructions (Addendum)
Depression:  Glad you are feeling much improved  Continue Prozac   Hypertension:  Discussed BP with Cardiology pharmacist  Will increase Bidil from 1 tab TID to 2 tabs TID    Follow up:  Follow up in 3 months  Pharmacist to schedule an appointment with patient for follow up

## 2021-05-25 NOTE — Assessment & Plan Note (Signed)
Depression:  Glad you are feeling much improved  Continue Prozac   Hypertension:  Discussed BP with Cardiology pharmacist  Will increase Bidil from 1 tab TID to 2 tabs TID    Follow up:  Follow up in 3 months  Pharmacist to schedule an appointment with patient for follow up 

## 2021-05-27 NOTE — Telephone Encounter (Signed)
Patient calling back. He states his BP is 83/53 today, but he does not feel bad and has no symptoms.

## 2021-05-27 NOTE — Telephone Encounter (Signed)
Called pt. BP yesterday on higher dose of BiDil 2 tabs TID was 133/61. Checked BP once today when his reading was low at 83/53. I advised pt to recheck BP again to confirm reading while I was on the phone with him, repeat BP 173/96. He's aware to continue on higher BiDil dose and continue monitoring BP, unsure if reading this AM was an error given his very elevated BP a few days ago in office. Advised him if he notices readings that look abnormally low, to recheck 5-10 minutes later to confirm accuracy. Already plan to follow up with pt in a few weeks via phone for updated BP readings.

## 2021-05-31 ENCOUNTER — Other Ambulatory Visit: Payer: Self-pay

## 2021-05-31 ENCOUNTER — Encounter: Payer: Self-pay | Admitting: Adult Health

## 2021-05-31 ENCOUNTER — Ambulatory Visit (INDEPENDENT_AMBULATORY_CARE_PROVIDER_SITE_OTHER): Payer: Self-pay | Admitting: Adult Health

## 2021-05-31 VITALS — BP 162/82 | HR 65 | Ht 65.0 in | Wt 131.0 lb

## 2021-05-31 DIAGNOSIS — I63412 Cerebral infarction due to embolism of left middle cerebral artery: Secondary | ICD-10-CM

## 2021-05-31 DIAGNOSIS — E785 Hyperlipidemia, unspecified: Secondary | ICD-10-CM

## 2021-05-31 DIAGNOSIS — I513 Intracardiac thrombosis, not elsewhere classified: Secondary | ICD-10-CM

## 2021-05-31 DIAGNOSIS — I10 Essential (primary) hypertension: Secondary | ICD-10-CM

## 2021-05-31 NOTE — Patient Instructions (Addendum)
Continue aspirin 81 mg daily and Eliquis (apixaban) daily  and atorvastatin and zetia  for secondary stroke prevention  Continue to follow up with PCP regarding cholesterol and blood pressure management  Maintain strict control of hypertension with blood pressure goal below 130/90 and cholesterol with LDL cholesterol (bad cholesterol) goal below 70 mg/dL.   From a stroke standpoint, you can return back to work as discussed with your supervisor but please discuss this further with cardiology prior to return     Followup in the future with me in 6 months or call earlier if needed       Thank you for coming to see [place] at [facility]. I hope we have been able to provide you high quality care today.  You may receive a patient satisfaction survey over the next few weeks. We would appreciate your feedback and comments so that we may continue to improve ourselves and the health of our patients.

## 2021-05-31 NOTE — Progress Notes (Signed)
I agree with the above plan 

## 2021-05-31 NOTE — Progress Notes (Signed)
[facility] [address] [place]. [place] [address] [phone]       STROKE FOLLOW UP NOTE  [name] Date of Birth:  May 04, 1961 Medical Record Number:  [id number]   Reason for Referral: stroke follow up    SUBJECTIVE:   CHIEF COMPLAINT:  Chief Complaint  Patient presents with   Follow-up    RM 3 with sister Todd Vega Pt is well and stable, Gets headaches sometimes from meds.Marland Kitchen      HPI:   Today, 05/31/2021, Todd Vega returns for 104-month stroke follow-up accompanied by his sister, Todd Vega.  Overall stable.  Reports residual speech and language difficulty but greatly improved since prior visit.  Denies residual weakness.  Denies new stroke/TIA symptoms.  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  Compliant on Eliquis, aspirin, atorvastatin and Zetia -denies associated side effects.  Blood pressure today elevated although routinely monitors at home typically 120s-130s/60s and HR 60-70s. Started on new BP med last month with occasional mild headaches which cardiology is aware of.  Repeat 2D echo 8/4 showed improvement of EF.  Has cardiac MRI scheduled 8/25 and f/u with cardiothoracic 9/6.  No further concerns at this time.    History provided for reference purposes only Initial visit 02/25/2021 JM: Todd Vega is being seen for hospital follow-up accompanied by his sister, Todd Vega.  Doing well since discharge with residual expressive aphasia and mild hand weakness with inmprovement. Was working with [facility] therapies but since completed. Currently in the process of applying for [facility] Financial assistance as well as long-term disability.  He continues to do exercises at home.  Currently living with his sister but is able to maintain ADLs and majority of IADLs independently.  Denies cognitive deficits or memory concerns.  Denies new stroke/TIA symptoms.  Remains on aspirin and Eliquis as well as atorvastatin without  associated side effects Blood pressure today 184/120 - currently working with cardiology for medication adjustment with recent dosage increase of Entresto and continued use of carvedilol and spironolactone  No further concerns at this time   Stroke admission 1422 Todd Vega is a 60 y.o. male w/pmh of alcohol use and medical non compliance who presented to [facility] ED on 01/23/2021 after awakening in the night with onset of aphasia and facial droop.  Personally reviewed hospitalization pertinent progress notes, lab work and imaging with summary provided.  Evaluated by Dr. 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  Initial 2D echo showed EF 25 to 30% 4/9 and 2D echo limited 4/12 showed concern of early thrombus formation in the apex and LV EF <20%.  Initially on heparin and transition to Eliquis recommend 82-month duration at discharge as well as aspirin.  CTA head/neck without significant atherosclerosis.  TEE 4/11 no evidence of cardiac source of embolism or PFO.  TCD negative for PFO.  Cardiac cath showed moderate to severe CAD. LDL 85 and initiated atorvastatin 40 mg daily.  HTN with elevated BP and cardiology initiating Entresto and spironolactone prior to discharge.  A1c 5.6.  Residual deficits of dense expressive aphasia and mild right grip weakness.  Evaluated by therapies   ROS:   14 system review of systems performed and negative with exception of those listed in HPI  PMH:  Past Medical History:  Diagnosis Date   Asthma due to seasonal allergies     PSH:  Past Surgical History:  Procedure Laterality Date  BUBBLE STUDY  01/25/2021   Procedure: BUBBLE STUDY;  Surgeon: Jodelle Red, MD;  Location: [facility] ENDOSCOPY;  Service: Cardiovascular;;   RIGHT HEART CATH AND CORONARY ANGIOGRAPHY N/A 01/27/2021   Procedure: RIGHT HEART CATH AND CORONARY ANGIOGRAPHY;  Surgeon: Lyn Records, MD;  Location: [facility] INVASIVE CV LAB;  Service: Cardiovascular;   Laterality: N/A;   TEE WITHOUT CARDIOVERSION N/A 01/25/2021   Procedure: TRANSESOPHAGEAL ECHOCARDIOGRAM (TEE);  Surgeon: Jodelle Red, MD;  Location: [facility] ENDOSCOPY;  Service: Cardiovascular;  Laterality: N/A;    Social History:  Social History   Socioeconomic History   Marital status: Divorced    Spouse name: Not on file   Number of children: 0   Years of education: Not on file   Highest education level: Some college, no degree  Occupational History   Not on file  Tobacco Use   Smoking status: Never   Smokeless tobacco: Never  Vaping Use   Vaping Use: Never used  Substance and Sexual Activity   Alcohol use: Yes    Alcohol/week: 6.0 standard drinks    Types: 6 Cans of beer per week   Drug use: Never   Sexual activity: Not on file  Other Topics Concern   Not on file  Social History Narrative   Not on file   Social Determinants of Health   Financial Resource Strain: Low Risk    Difficulty of Paying Living Expenses: Not very hard  Food Insecurity: No Food Insecurity   Worried About Running Out of Food in the Last Year: Never true   Ran Out of Food in the Last Year: Never true  Transportation Needs: No Transportation Needs   Lack of Transportation (Medical): No   Lack of Transportation (Non-Medical): No  Physical Activity: Not on file  Stress: Not on file  Social Connections: Not on file  Intimate Partner Violence: Not on file    Family History: History reviewed. No pertinent family history.  Medications:   Current Outpatient Medications on File Prior to Visit  Medication Sig Dispense Refill   apixaban (ELIQUIS) 5 MG TABS tablet Take 1 tablet (5 mg total) by mouth 2 (two) times daily. 60 tablet 5   aspirin 81 MG EC tablet Take 1 tablet (81 mg total) by mouth daily. Swallow whole. 30 tablet 0   atorvastatin (LIPITOR) 80 MG tablet Take 1 tablet (80 mg total) by mouth daily. 90 tablet 3   carvedilol (COREG) 25 MG tablet Take 1 tablet (25 mg total) by mouth 2  (two) times daily with a meal. 180 tablet 3   dapagliflozin propanediol ([name]) 10 MG TABS tablet Take 1 tablet (10 mg total) by mouth daily before breakfast. 30 tablet 0   ezetimibe (ZETIA) 10 MG tablet Take 1 tablet (10 mg total) by mouth daily. 90 tablet 3   fexofenadine (ALLEGRA) 180 MG tablet Take 180 mg by mouth as needed for allergies or rhinitis.     FLUoxetine (PROZAC) 20 MG capsule Take 1 capsule (20 mg total) by mouth daily. 30 capsule 2   isosorbide-hydrALAZINE (BIDIL) 20-37.5 MG tablet Take 2 tablets by mouth 3 (three) times daily.     sacubitril-valsartan (ENTRESTO) 97-103 MG Take 1 tablet by mouth 2 (two) times daily. 180 tablet 3   spironolactone (ALDACTONE) 50 MG tablet Take 1 tablet (50 mg total) by mouth daily. 30 tablet 5   No current facility-administered medications on file prior to visit.    Allergies:   Allergies  Allergen Reactions   Chocolate  Coconut (Cocos Nucifera) Allergy Skin Test       OBJECTIVE:  Physical Exam  Vitals:   05/31/21 0832  BP: (!) 162/82  Pulse: 65  Weight: 131 lb (59.4 kg)  Height: 5\' 5"  (1.651 m)    Body mass index is 21.8 kg/m. No results found.  General: Frail very pleasant middle-aged African-American male, seated, in no evident distress Head: head normocephalic and atraumatic.   Neck: supple with no carotid or supraclavicular bruits Cardiovascular: regular rate and rhythm, no murmurs Musculoskeletal: no deformity Skin:  no rash/petichiae Vascular:  Normal pulses all extremities   Neurologic Exam Mental Status: Awake and fully alert. Mild aphasia and mild dysarthria with occasional speech hesitancy. Oriented to place and time. Recent memory mildly impaired and remote memory intact. Attention span, concentration and fund of knowledge appropriate. Mood and affect appropriate. Recall: 1/3 (with prompting, 2/3).  Serial addition good.  4 legged animal naming 9 in 30 seconds.  Cranial Nerves: Pupils equal, briskly  reactive to light. Extraocular movements full without nystagmus. Visual fields full to confrontation. Hearing intact. Facial sensation intact. Right lower facial weakness. Tongue deviates towards right.  palate moves normally and symmetrically.  Motor: Normal bulk and tone. Normal strength in all tested extremity muscles except  Sensory.: intact to touch , pinprick , position and vibratory sensation.  Coordination: Rapid alternating movements normal in all extremities. Finger-to-nose and heel-to-shin performed accurately bilaterally. Gait and Station: Arises from chair without difficulty. Stance is normal. Gait demonstrates normal stride length and balance without use of assitive device. Tandem walk and heel toe without difficulty.  Reflexes: 1+ and symmetric. Toes downgoing.         ASSESSMENT: [place] Kielbasa is a 60 y.o. 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 Vascular risk factors include LV thrombus, mod to severe CAD, CHF with recent EF 40-45% compared to prior at 20 to 25%, HTN, HLD, EtOH use and medical noncompliance.      PLAN:  Left MCA stroke:  Residual deficit: Mild aphasia and dysarthria -continued improvement.  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 From a stroke standpoint, cleared to return back to work. Advised he will also need clearance from cardiology prior to return -he verbalized understanding Continue aspirin 81 mg daily and Eliquis (apixaban) daily  and atorvastatin 80 mg daily and Zetia for secondary stroke prevention.  Discussed secondary stroke prevention measures and importance of close PCP follow up for aggressive stroke risk factor management  HTN: BP goal <130/90.  Well-controlled at home routinely  followed by cardiology HLD: LDL goal <70. Recent LDL 74 -continue atorvastatin 80 mg daily and Zetia per cardiology LV thrombus: On Eliquis per cards. Recent 2D echo showed improvement of LV function.  Scheduled for CMR 8/25.     Follow up in 6 months or call earlier if needed   CC:  [facility] provider: Dr. 9/25 PCP: Pearlean Brownie, NP    I spent 36 minutes of face-to-face and non-face-to-face time with patient and sister.  This included previsit chart review, lab review, study review, electronic health record documentation, patient and sister education and discussion regarding prior stroke with residual deficits, secondary stroke prevention measures and aggressive stroke risk factor management, routine follow-up with cardiology and answered  all other questions to patient and sister satisfaction  Ihor AustinJessica McCue, [facility] Plains Memorial HospitalGNP-BC  [facility] [address] [place], [place] [id number]  Phone [phone] Fax [phone] Note: This document was prepared with digital dictation and possible smart phrase technology. Any transcriptional errors that result from this process are unintentional.

## 2021-06-02 DIAGNOSIS — Z0271 Encounter for disability determination: Secondary | ICD-10-CM

## 2021-06-08 ENCOUNTER — Telehealth (HOSPITAL_COMMUNITY): Payer: Self-pay | Admitting: *Deleted

## 2021-06-08 NOTE — Telephone Encounter (Signed)
Attempted to call patient regarding upcoming cardiac MRI appointment. Left message on voicemail with name and callback number  Talin Rozeboom RN Navigator Cardiac Imaging [facility] [phone] Office [phone] Cell  

## 2021-06-09 ENCOUNTER — Telehealth (HOSPITAL_COMMUNITY): Payer: Self-pay | Admitting: *Deleted

## 2021-06-09 NOTE — Telephone Encounter (Signed)
Patient returning call regarding upcoming cardiac imaging study; pt verbalizes understanding of appt date/time, parking situation and where to check in, and verified current allergies; name and call back number provided for further questions should they arise  Nirali Magouirk RN Navigator Cardiac Imaging [facility] [phone] office [phone] cell  

## 2021-06-10 ENCOUNTER — Ambulatory Visit (HOSPITAL_COMMUNITY)
Admission: RE | Admit: 2021-06-10 | Discharge: 2021-06-10 | Disposition: A | Discharge status: Home / Self Care | Payer: Self-pay | Source: Ambulatory Visit | Attending: Cardiovascular Disease | Admitting: Cardiovascular Disease

## 2021-06-10 ENCOUNTER — Other Ambulatory Visit: Payer: Self-pay

## 2021-06-10 ENCOUNTER — Telehealth: Payer: Self-pay | Admitting: Pharmacist

## 2021-06-10 DIAGNOSIS — I513 Intracardiac thrombosis, not elsewhere classified: Secondary | ICD-10-CM

## 2021-06-10 DIAGNOSIS — I5043 Acute on chronic combined systolic (congestive) and diastolic (congestive) heart failure: Secondary | ICD-10-CM

## 2021-06-10 DIAGNOSIS — I10 Essential (primary) hypertension: Secondary | ICD-10-CM

## 2021-06-10 IMAGING — MR MR CARD MORPHOLOGY WO/W CM
45 of 48 series · 45 of 48 positions shown · IV contrast (Gadavist)
Comparison: none

CLINICAL DATA: 60M with CVA in [DATE], found to have new systolic
heart failure with EF 25-30%, LV thrombus. Cath showed severe LAD,
ramus, and LCX disease. Being evaluated for CABG. ALLI ordered to
assess viability and evaluate for LV thrombus

EXAM:
CARDIAC MRI
TECHNIQUE: The patient was scanned on a 1.5 Tesla Siemens magnet. A dedicated
cardiac coil was used. Functional imaging was done using Fiesta
sequences. [DATE], and 4 chamber views were done to assess for RWMA's.
Modified ALLI rule using a short axis stack was used to
calculate an ejection fraction on a dedicated work station using
Circle software. The patient received 9 cc of Gadavist. After 10
minutes inversion recovery sequences were used to assess for
infiltration and scar tissue.
CONTRAST:  9 cc  of Gadavist

[Series 8: [id number] · axial · 8.0mm · 1.41mm/px · 1 of 20 slices shown]
[im 1/20]
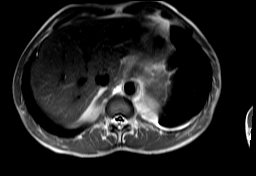

[Series 13: bSSFP · oblique · 8.0mm · 1.61mm/px · 1 of 17 slices shown (1 of 21)]
[im 1/17]
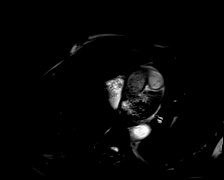

[Series 14: bSSFP · oblique · 8.0mm · 1.61mm/px · 1 of 17 slices shown (2 of 21)]
[im 1/17]
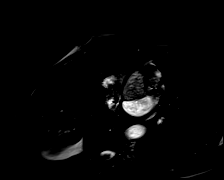

[Series 15: bSSFP · oblique · 8.0mm · 1.61mm/px · 1 of 17 slices shown (3 of 21)]
[im 1/17]
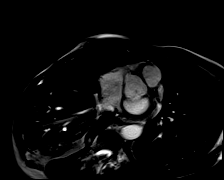

[Series 16: bSSFP · oblique · 8.0mm · 1.61mm/px · 1 of 17 slices shown (4 of 21)]
[im 1/17]
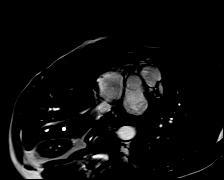

[Series 17: bSSFP · oblique · 8.0mm · 1.61mm/px · 1 of 17 slices shown (5 of 21)]
[im 1/17]
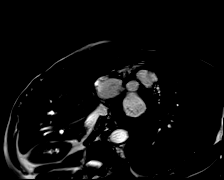

[Series 18: bSSFP · oblique · 8.0mm · 1.61mm/px · 1 of 17 slices shown (6 of 21)]
[im 1/17]
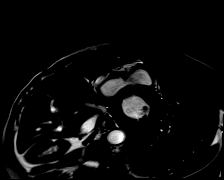

[Series 19: bSSFP · oblique · 8.0mm · 1.61mm/px · 1 of 17 slices shown (7 of 21)]
[im 1/17]
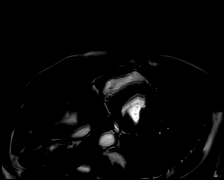

[Series 22: bSSFP · oblique · 8.0mm · 1.61mm/px · 1 of 19 slices shown (8 of 21)]
[im 1/19]
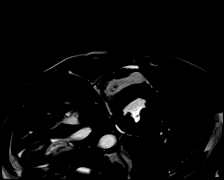

[Series 23: bSSFP · oblique · 8.0mm · 1.61mm/px · 1 of 19 slices shown (9 of 21)]
[im 1/19]
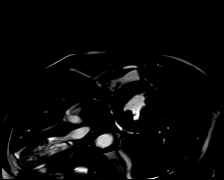

[Series 24: bSSFP · oblique · 8.0mm · 1.61mm/px · 1 of 19 slices shown (10 of 21)]
[im 1/19]
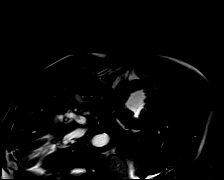

[Series 25: bSSFP · oblique · 8.0mm · 1.61mm/px · 1 of 19 slices shown (11 of 21)]
[im 1/19]
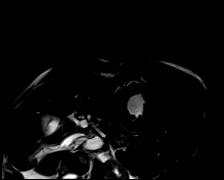

[Series 26: bSSFP · oblique · 8.0mm · 1.61mm/px · 1 of 19 slices shown (12 of 21)]
[im 1/19]
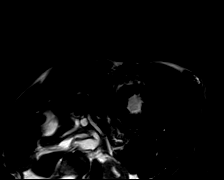

[Series 27: bSSFP · oblique · 8.0mm · 1.61mm/px · 1 of 19 slices shown (13 of 21)]
[im 1/19]
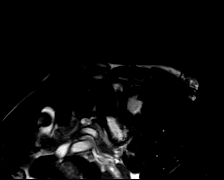

[Series 28: bSSFP · oblique · 8.0mm · 1.61mm/px · 1 of 19 slices shown (14 of 21)]
[im 1/19]
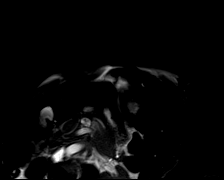

[Series 29: bSSFP · oblique · 8.0mm · 1.61mm/px · 1 of 19 slices shown (15 of 21)]
[im 1/19]
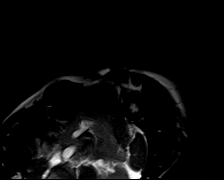

[Series 30: bSSFP · oblique · 8.0mm · 1.61mm/px · 1 of 19 slices shown (16 of 21)]
[im 1/19]
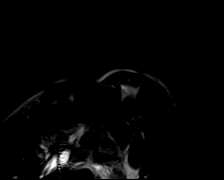

[Series 31: bSSFP · oblique · 8.0mm · 1.61mm/px · 1 of 19 slices shown (17 of 21)]
[im 1/19]
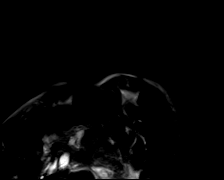

[Series 32: bSSFP · oblique · 6.0mm · 1.41mm/px · 1 of 20 slices shown (18 of 21)]
[im 1/20]
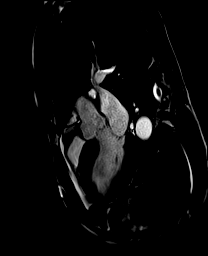

[Series 33: bSSFP · oblique · 6.0mm · 1.41mm/px · 1 of 18 slices shown (19 of 21)]
[im 1/18]
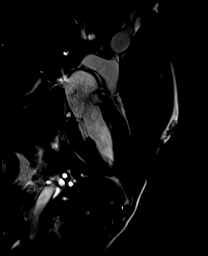

[Series 34: bSSFP · oblique · 6.0mm · 1.41mm/px · 1 of 21 slices shown (20 of 21)]
[im 1/21]
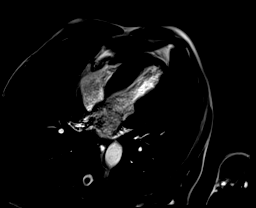

[Series 35: (id)_long_t1 · oblique · 8.0mm · 1.56mm/px · 1 of 24 slices shown]
[im 1/24]
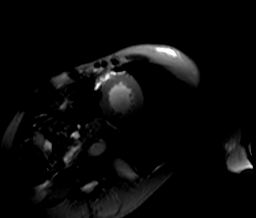

[Series 36: (id)_long_t1_moco · oblique · 8.0mm · 1.56mm/px · 1 of 24 slices shown]
[im 1/24]
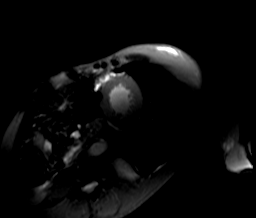

[Series 39: bSSFP · oblique · 6.0mm · 1.41mm/px · 1 of 14 slices shown (21 of 21)]
[im 1/14]
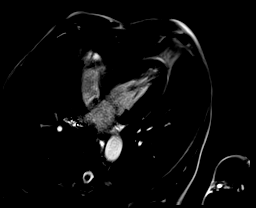

[Series 40: cine_trufi_cs_rt_short axis · oblique · 8.0mm · 1.73mm/px · 1 of 18 slices shown (1 of 18)]
[im 1/18]
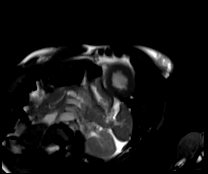

[Series 40: cine_trufi_cs_rt_short axis · oblique · 8.0mm · 1.73mm/px · 1 of 18 slices shown (2 of 18)]
[im 1/18]
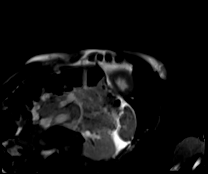

[Series 40: cine_trufi_cs_rt_short axis · oblique · 8.0mm · 1.73mm/px · 1 of 18 slices shown (3 of 18)]
[im 1/18]
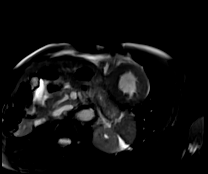

[Series 40: cine_trufi_cs_rt_short axis · oblique · 8.0mm · 1.73mm/px · 1 of 18 slices shown (4 of 18)]
[im 1/18]
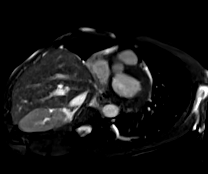

[Series 40: cine_trufi_cs_rt_short axis · oblique · 8.0mm · 1.73mm/px · 1 of 18 slices shown (5 of 18)]
[im 1/18]
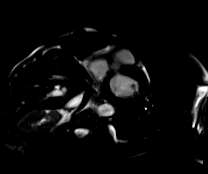

[Series 40: cine_trufi_cs_rt_short axis · oblique · 8.0mm · 1.73mm/px · 1 of 18 slices shown (6 of 18)]
[im 1/18]
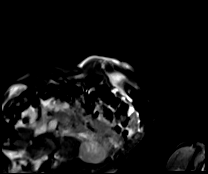

[Series 40: cine_trufi_cs_rt_short axis · oblique · 8.0mm · 1.73mm/px · 1 of 18 slices shown (7 of 18)]
[im 1/18]
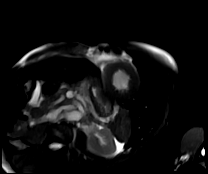

[Series 40: cine_trufi_cs_rt_short axis · oblique · 8.0mm · 1.73mm/px · 1 of 18 slices shown (8 of 18)]
[im 1/18]
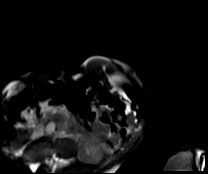

[Series 40: cine_trufi_cs_rt_short axis · oblique · 8.0mm · 1.73mm/px · 1 of 18 slices shown (9 of 18)]
[im 1/18]
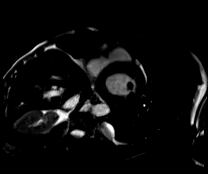

[Series 40: cine_trufi_cs_rt_short axis · oblique · 8.0mm · 1.73mm/px · 1 of 18 slices shown (10 of 18)]
[im 1/18]
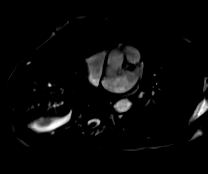

[Series 40: cine_trufi_cs_rt_short axis · oblique · 8.0mm · 1.73mm/px · 1 of 18 slices shown (11 of 18)]
[im 1/18]
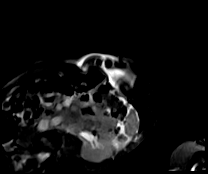

[Series 40: cine_trufi_cs_rt_short axis · oblique · 8.0mm · 1.73mm/px · 1 of 18 slices shown (12 of 18)]
[im 1/18]
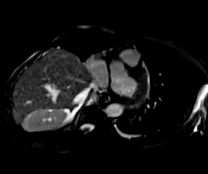

[Series 40: cine_trufi_cs_rt_short axis · oblique · 8.0mm · 1.73mm/px · 1 of 18 slices shown (13 of 18)]
[im 1/18]
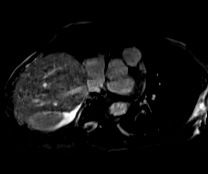

[Series 40: cine_trufi_cs_rt_short axis · oblique · 8.0mm · 1.73mm/px · 1 of 18 slices shown (14 of 18)]
[im 1/18]
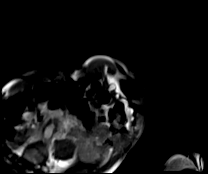

[Series 40: cine_trufi_cs_rt_short axis · oblique · 8.0mm · 1.73mm/px · 1 of 18 slices shown (15 of 18)]
[im 1/18]
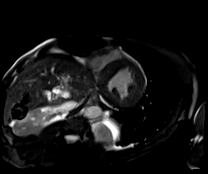

[Series 40: cine_trufi_cs_rt_short axis · oblique · 8.0mm · 1.73mm/px · 1 of 18 slices shown (16 of 18)]
[im 1/18]
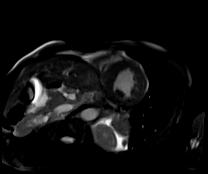

[Series 40: cine_trufi_cs_rt_short axis · oblique · 8.0mm · 1.73mm/px · 1 of 18 slices shown (17 of 18)]
[im 1/18]
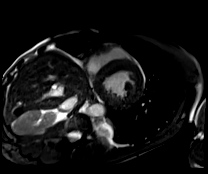

[Series 40: cine_trufi_cs_rt_short axis · oblique · 8.0mm · 1.73mm/px · 1 of 18 slices shown (18 of 18)]
[im 1/18]
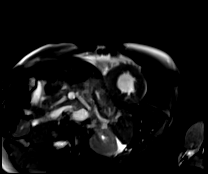

[Series 41: pre short axis · oblique · non-contrast · 8.0mm · 2.25mm/px · 1 of 10 slices shown (1 of 3)]
[im 1/10]
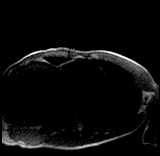

[Series 42: pre short axis · oblique · non-contrast · 8.0mm · 2.25mm/px · 1 of 10 slices shown (2 of 3)]
[im 1/10]
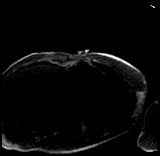

[Series 43: pre short axis · oblique · non-contrast · 8.0mm · 2.25mm/px · 1 of 10 slices shown (3 of 3)]
[im 1/10]
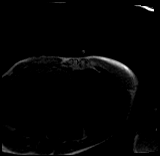

[45 of 48 positions shown; findings below may reference images not displayed]

FINDINGS: Left ventricle:

-Severe hypertrophy measuring up to 18mm in basal septum (16mm in
posterior wall)

-Moderate dilatation

-Moderate systolic dysfunction

-Nonspecific ECV elevation (33%)

-No LGE

-No LV thrombus

LV EF: 36% (Normal 56-78%)

Absolute volumes:

LV EDV: 236mL (Normal 77-195 mL)

LV ESV: 150mL (Normal 19-72 mL)

LV SV: 86mL (Normal 51-133 mL)

CO: 5.3L/min (Normal 2.8-8.8 L/min)

Indexed volumes:

LV EDV: 143mL/sq-m (Normal 47-92 mL/sq-m)

LV ESV: 91mL/sq-m (Normal 13-30 mL/sq-m)

LV SV: 52mL/sq-m (Normal 32-62 mL/sq-m)

CI: 3.2L/min/sq-m (Normal 1.7-4.2 L/min/sq-m)

Right ventricle: Normal size and systolic function

RV EF:  54% (Normal 47-74%)

Absolute volumes:

RV EDV: 153mL (Normal 88-227 mL)

RV ESV: 70mL (Normal 23-103 mL)

RV SV: 83mL (Normal 52-138 mL)

CO: 5.1L/min (Normal 2.8-8.8 L/min)

Indexed volumes:

RV EDV: 93mL/sq-m (Normal 55-105 mL/sq-m)

RV ESV: 42mL/sq-m (Normal 15-43 mL/sq-m)

RV SV: 50mL/sq-m (Normal 32-64 mL/sq-m)

CI: 3.1L/min/sq-m (Normal 1.7-4.2 L/min/sq-m)

Left atrium: Normal size

Right atrium: Normal size

Mitral valve: No regurgitation

Aortic valve: No regurgitation

Tricuspid valve: No regurgitation

Pulmonic valve: No regurgitation

Aorta: Normal proximal ascending aorta

Pericardium: Normal

Extracardiac structures: Possible RUL pulmonary nodule, poorly
visualized. Recommend CT chest for further evaluation
IMPRESSION: 1.  No late gadolinium enhancement, suggesting myocardium is viable

2.  No LV thrombus seen

3. Moderate LV dilatation with moderate systolic dysfunction (EF
36%)

4. Severe LV hypertrophy measuring up to 18mm in basal septum (16mm
in posterior wall). While wall thickness greater than 15mm meets
criteria for hypertrophic cardiomyopathy, hypertensive heart disease
can commonly cause wall thickness up to 20mm in African American
patients. Given history of uncontrolled hypertension, suspect this
more likely represents hypertensive heart disease

5.  Normal RV size and systolic function (EF 54%)

6. Possible RUL pulmonary nodule, poorly visualized. Recommend CT
chest for further evaluation

## 2021-06-10 MED ORDER — GADOBUTROL 1 MMOL/ML IV SOLN
9.0000 mL | Freq: Once | INTRAVENOUS | Status: AC | PRN
  Administered 2021-06-10: 9 mL via INTRAVENOUS

## 2021-06-10 NOTE — Telephone Encounter (Signed)
Called pt to follow up with home BP readings - 148/96 this AM but nervous about MRI. Over the past few days, BP readings have been 112/61, 127/59, 137/76. Will continue current CHF med regimen including Entresto 97-103mg  BID, carvedilol 25mg  BID, spironolactone 50mg  daily, [name] 10mg  daily, and Bidil 2 tabs TID. He will keep f/u with Dr next month and call with any BP related concerns before then.

## 2021-06-14 ENCOUNTER — Other Ambulatory Visit: Payer: Self-pay

## 2021-06-14 ENCOUNTER — Telehealth: Payer: Self-pay | Admitting: Nurse Practitioner

## 2021-06-14 DIAGNOSIS — R911 Solitary pulmonary nodule: Secondary | ICD-10-CM

## 2021-06-14 NOTE — Telephone Encounter (Signed)
Reviewed results with patient. I answered questions to his satisfaction and encouraged him to keep the appointment with Dr. Dorris Fetch to discuss CABG. He states he is unable to afford surgery. I advised that we have resources to assist him and encouraged him to go the appointment so that he can understand the risks and benefits of surgery versus not having surgery. I advised that we have also sent a referral to pulmonary regarding the nodule seen on MRI. He verbalized understanding and agreement and thanked me for the call.

## 2021-06-14 NOTE — Telephone Encounter (Signed)
-----   Message from Vesta Mixer, MD sent at 06/14/2021  2:42 PM EDT ----- He has viable myocardium in his apex and would be a candidate . Please refer to pulmonary nodule clinic for follow up for this pulmonary nodule seen on cardiac MRI

## 2021-06-22 ENCOUNTER — Other Ambulatory Visit: Payer: Self-pay

## 2021-06-22 ENCOUNTER — Encounter: Payer: Self-pay | Admitting: Thoracic Surgery (Cardiothoracic Vascular Surgery)

## 2021-06-22 ENCOUNTER — Other Ambulatory Visit: Payer: Self-pay | Admitting: Thoracic Surgery (Cardiothoracic Vascular Surgery)

## 2021-06-22 ENCOUNTER — Ambulatory Visit (INDEPENDENT_AMBULATORY_CARE_PROVIDER_SITE_OTHER): Payer: Self-pay | Admitting: Thoracic Surgery (Cardiothoracic Vascular Surgery)

## 2021-06-22 VITALS — BP 164/83 | HR 68 | Resp 20 | Ht 65.0 in | Wt 128.6 lb

## 2021-06-22 DIAGNOSIS — I2510 Atherosclerotic heart disease of native coronary artery without angina pectoris: Secondary | ICD-10-CM

## 2021-06-22 DIAGNOSIS — R911 Solitary pulmonary nodule: Secondary | ICD-10-CM

## 2021-06-22 NOTE — Progress Notes (Signed)
PCP is [name], NP Referring Provider is Nahser, Deloris Ping, MD  Chief Complaint  Patient presents with   Coronary Artery Disease    Surgical consult, Cardiac Cath 01/27/21, ECHO 05/20/21, CARDIAC MRI 06/10/21    HPI: [name] returns to discuss possible coronary artery bypass grafting.  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  He was admitted in April 2022 with a stroke.  Work-up revealed an apical left ventricular thrombus.  At catheterization he was found to have severe two-vessel disease involving the LAD circumflex and a large ramus branch.  His RCA was normal.  Echocardiogram showed severe left ventricular dysfunction with ejection fraction of 20 to 25% and the LV thrombus.  He was evaluated by Dr. Cornelius Moras.  He was felt to be a surgical candidate but recommended an interval for recovery from the stroke.  He has been doing well.  He denies any chest pain, pressure, tightness, or shortness of breath.  He denies peripheral edema.  He has some hesitancy of speech but no residual weakness related to his stroke. 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/26/2021   Hyperlipidemia 01/26/2021   Hyponatremia 01/26/2021   Alcohol abuse 01/26/2021   Hypomagnesemia 01/26/2021   Acute CVA (cerebrovascular accident) (HCC) 01/23/2021   HTN (hypertension) 01/23/2021   CVA (cerebral vascular accident) (HCC) 01/23/2021     Past Surgical History:  Procedure Laterality Date   BUBBLE STUDY  01/25/2021   Procedure: BUBBLE STUDY;  Surgeon: Jodelle Red, MD;  Location: [facility] ENDOSCOPY;  Service: Cardiovascular;;   RIGHT HEART CATH AND  CORONARY ANGIOGRAPHY N/A 01/27/2021   Procedure: RIGHT HEART CATH AND CORONARY ANGIOGRAPHY;  Surgeon: Lyn Records, MD;  Location: [facility] INVASIVE CV LAB;  Service: Cardiovascular;  Laterality: N/A;   TEE WITHOUT CARDIOVERSION N/A 01/25/2021   Procedure: TRANSESOPHAGEAL ECHOCARDIOGRAM (TEE);  Surgeon: Jodelle Red, MD;  Location: [facility] ENDOSCOPY;  Service: Cardiovascular;  Laterality: N/A;    History reviewed. No pertinent family history.  Social History Social History   Tobacco Use   Smoking status: Never   Smokeless tobacco: Never  Vaping Use   Vaping Use: Never used  Substance Use Topics   Alcohol use: Yes    Alcohol/week: 6.0 standard drinks    Types: 6 Cans of beer per week   Drug use: Never    Current Outpatient Medications  Medication Sig Dispense Refill   apixaban (ELIQUIS) 5 MG TABS tablet Take 1 tablet (5 mg total) by mouth 2 (two) times daily. 60 tablet 5   aspirin 81 MG EC tablet Take 1 tablet (81 mg total) by mouth daily. Swallow whole. 30 tablet 0   atorvastatin (LIPITOR) 80 MG tablet Take 1 tablet (80 mg total) by mouth daily. 90 tablet 3   carvedilol (COREG) 25 MG tablet Take 1 tablet (25 mg total) by mouth 2 (two) times daily with a meal. 180 tablet 3   dapagliflozin propanediol ([name]) 10 MG TABS tablet Take 1 tablet (10 mg total) by mouth daily before breakfast. 30 tablet 0   ezetimibe (ZETIA) 10 MG tablet Take 1 tablet (10 mg total) by mouth daily. 90 tablet 3   fexofenadine (ALLEGRA) 180 MG tablet Take 180 mg by  mouth as needed for allergies or rhinitis.     FLUoxetine (PROZAC) 20 MG capsule Take 1 capsule (20 mg total) by mouth daily. 30 capsule 2   isosorbide-hydrALAZINE (BIDIL) 20-37.5 MG tablet Take 2 tablets by mouth 3 (three) times daily.     sacubitril-valsartan (ENTRESTO) 97-103 MG Take 1 tablet by mouth 2 (two) times daily. 180 tablet 3   spironolactone (ALDACTONE) 50 MG tablet Take 1 tablet (50 mg total) by mouth daily. 30 tablet 5   No current  facility-administered medications for this visit.    Allergies  Allergen Reactions   Chocolate    Coconut (Cocos Nucifera) Allergy Skin Test     Review of Systems  Constitutional:  Negative for activity change and unexpected weight change.  Respiratory:  Negative for chest tightness and shortness of breath.   Cardiovascular:  Negative for chest pain, palpitations and leg swelling.  Gastrointestinal:  Negative for abdominal distention and abdominal pain.  Genitourinary:  Negative for difficulty urinating and dysuria.  Neurological:  Positive for speech difficulty. Negative for syncope and weakness.  All other systems reviewed and are negative.  BP (!) 164/83 (BP Location: Right Arm, Patient Position: Sitting, Cuff Size: Normal)   Pulse 68   Resp 20   Ht 5\' 5"  (1.651 m)   Wt 128 lb 9.6 oz (58.3 kg)   SpO2 98% Comment: RA  BMI 21.40 kg/m  Physical Exam Vitals reviewed.  Constitutional:      General: He is not in acute distress.    Appearance: Normal appearance.  Cardiovascular:     Rate and Rhythm: Normal rate and regular rhythm.     Heart sounds: Normal heart sounds. No murmur heard. Pulmonary:     Effort: Pulmonary effort is normal. No respiratory distress.     Breath sounds: Normal breath sounds. No wheezing.  Abdominal:     General: There is no distension.     Palpations: Abdomen is soft.     Tenderness: There is no abdominal tenderness.  Musculoskeletal:        General: No swelling.  Skin:    General: Skin is warm and dry.  Neurological:     Mental Status: He is alert and oriented to person, place, and time.     Cranial Nerves: No cranial nerve deficit.     Motor: No weakness.     Gait: Gait normal.     Comments: Mild expressive aphasia with halting speech.  Speech appropriate.     Diagnostic Tests: Cardiac catheterization 01/27/2021 Conclusion  Bulky calcified ulcerated distal left main 50%. Ostial LAD 85%, calcified.  First diagonal is large and contains  70 to 80% ostial narrowing and 90% stenosis of the smaller branch. Ostial ramus 70 to 80%, calcified. Ostial circumflex 50 to 70%, and calcified.  Proximal to mid circumflex 90%. RCA contains eccentric bulky 30 to 40% stenosis. Right heart pressures were normal.  Mean capillary wedge pressure 5 mmHg.  Mean pulmonary artery pressure 9 mmHg.   RECOMMENDATIONS:   Consider revascularization options. Consider viability study. Coronary anatomy is surgical.  Would be difficult to perform PCI and not have ischemic complications. I personally reviewed the catheterization images.  There is left main and two-vessel disease involving the LAD, ramus, and circumflex.  Cardiac MRI 06/10/2021 IMPRESSION: 1.  No late gadolinium enhancement, suggesting myocardium is viable   2.  No LV thrombus seen   3. Moderate LV dilatation with moderate systolic dysfunction (EF 36%)   4. Severe LV hypertrophy measuring  up to 75mm in basal septum (24mm in posterior wall). While wall thickness greater than 77mm meets criteria for hypertrophic cardiomyopathy, hypertensive heart disease can commonly cause wall thickness up to 22mm in African American patients. Given history of uncontrolled hypertension, suspect this more likely represents hypertensive heart disease   5.  Normal RV size and systolic function (EF 54%)   6. Possible RUL pulmonary nodule, poorly visualized. Recommend CT chest for further evaluation     Electronically Signed   By: Epifanio Lesches M.D.   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  He presented in April with a stroke.  Work-up showed severe left ventricular dysfunction with an ejection fraction of 20 to 25% with left ventricular thrombus.   He was treated with anticoagulation.  Catheterization showed left main and severe two-vessel coronary disease.  Left main and two-vessel coronary disease-not favorable for percutaneous intervention.  Options are medical therapy versus surgery.  I discussed in detail with Mr.Taves the indications for surgery.  He is not having any anginal symptoms so the operation will make him feel better but does provide survival benefit.  He understands that with his ostial lesions he could have a massive MI that would be fatal without necessarily any forewarning.  He does not want to have coronary bypass grafting.  He prefers to continue with medical therapy.  He does understand the risk that he is accepting.  CVA-in April 2022.  Has a mild expressive aphasia but otherwise intact with good motor function.  Hypertension-blood pressure extremely elevated today.  He is on medication for that.  He does have left ventricular hypertrophy.  He has been tracking his blood pressures at home and they have been well controlled.  Possible lung nodule on MR-needs a CT to better evaluate.  Aortic root enlargement on echocardiogram-we will evaluate with CT as well.  Plan: CT of the chest to evaluate aortic root and ascending aorta and possible lung nodule. Return in 2 weeks to discuss results of that test and also further discuss possible coronary bypass grafting.  Loreli Slot, MD [facility] [phone]

## 2021-06-28 ENCOUNTER — Other Ambulatory Visit: Payer: Self-pay

## 2021-06-28 ENCOUNTER — Other Ambulatory Visit (INDEPENDENT_AMBULATORY_CARE_PROVIDER_SITE_OTHER): Payer: Self-pay | Admitting: Primary Care

## 2021-06-28 ENCOUNTER — Telehealth: Payer: Self-pay | Admitting: Adult Health

## 2021-06-28 DIAGNOSIS — F331 Major depressive disorder, recurrent, moderate: Secondary | ICD-10-CM

## 2021-06-28 DIAGNOSIS — F411 Generalized anxiety disorder: Secondary | ICD-10-CM

## 2021-06-28 NOTE — Telephone Encounter (Signed)
Signed and placed in outbox.  Thank you. ?

## 2021-06-28 NOTE — Telephone Encounter (Signed)
Pt is asking for a letter from Shanda Bumps, NP allowing him to return to work

## 2021-06-28 NOTE — Telephone Encounter (Signed)
Contacted pt to inform him his letter to return to work is ready and is upfront waiting for pickup

## 2021-06-28 NOTE — Telephone Encounter (Signed)
Requested medications are due for refill today.  yes  Requested medications are on the active medications list.  yes  Last refill. 03/25/2021  Future visit scheduled.   yes  Notes to clinic.  Prescription expired 06/25/2021

## 2021-06-28 NOTE — Telephone Encounter (Signed)
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 From a stroke standpoint, cleared to return back to work. Advised he will also need clearance from cardiology prior to return -he verbalized understanding"  Pt stated his surgeon said cardio could not clear him we had to. Are you willing to clear him ?

## 2021-06-28 NOTE — Telephone Encounter (Signed)
Letter printed and on your desk for signature.

## 2021-06-28 NOTE — Telephone Encounter (Signed)
It was more of an "okay" from cardiac standpoint and to ensure he has no restrictions. If not (which im assuming if they told him we had to clear him) then he can return back as previously stated. Thank you

## 2021-06-29 MED ORDER — FLUOXETINE HCL 20 MG PO CAPS
20.0000 mg | ORAL_CAPSULE | Freq: Every day | ORAL | 2 refills | Status: DC
  Filled 2021-06-29: qty 30, 30d supply, fill #0
  Filled 2021-07-26: qty 30, 30d supply, fill #1

## 2021-06-29 NOTE — Telephone Encounter (Signed)
Sent to PCP ?

## 2021-06-30 ENCOUNTER — Other Ambulatory Visit: Payer: Self-pay

## 2021-07-01 ENCOUNTER — Other Ambulatory Visit: Payer: Self-pay

## 2021-07-05 ENCOUNTER — Other Ambulatory Visit: Payer: Self-pay

## 2021-07-08 ENCOUNTER — Encounter: Payer: Self-pay | Admitting: Cardiovascular Disease

## 2021-07-08 NOTE — Progress Notes (Signed)
Cardiology Office Note:    Date:  07/09/2021   ID:  Todd Vega, DOB Dec 06, 1960, MRN [id number]  PCP:  Kerin Perna, NP   [facility] Providers Cardiologist:  Mertie Moores, MD {    Referring MD: Kerin Perna, NP   Chief Complaint  Patient presents with   Coronary Artery Disease   Congestive Heart Failure    May 03, 2021   Todd Vega is a 60 y.o. male with a hx of CVA , CHF Seen with Larene Beach ( niece )  I met him in April 2022 for eval of his CHF Echo showed EF 20-25%. Cath revealed severe CAD  Echo revealed LV thrombus Was seen by Dr. Roxy Manns and CABG was discussed He did not want CABG at that time I saw him in May, 2022 and he still did not want to consider CABG. 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 23, 2022: Todd Vega is seen today for follow up visit , here with Tharon Aquas ( sister)  Hx of CVA, CAD, CHF His LV thrombus has resolved. Cardiac MRI showed viable myocardium with EF 36%. He has not had any angina. He has been seen by Dr. Lazaro Arms who offered CABG . Todd Vega did not want to pursue CABG, He is here for further discussion Is back working  ( is a Biomedical scientist)  No CP , no dyspnea     Past Medical History:  Diagnosis Date   Asthma due to seasonal allergies     Past Surgical History:  Procedure Laterality Date   BUBBLE STUDY  01/25/2021   Procedure: BUBBLE STUDY;  Surgeon: Buford Dresser, MD;  Location: [facility];  Service: Cardiovascular;;   RIGHT HEART CATH AND CORONARY ANGIOGRAPHY N/A 01/27/2021   Procedure: RIGHT HEART CATH AND CORONARY ANGIOGRAPHY;  Surgeon: Belva Crome, MD;  Location: [facility];  Service: Cardiovascular;  Laterality: N/A;   TEE WITHOUT CARDIOVERSION N/A 01/25/2021    Procedure: TRANSESOPHAGEAL ECHOCARDIOGRAM (TEE);  Surgeon: Buford Dresser, MD;  Location: [facility] ENDOSCOPY;  Service: Cardiovascular;  Laterality: N/A;    Current Medications: Current Meds  Medication Sig   apixaban (ELIQUIS) 5 MG TABS tablet Take 1 tablet (5 mg total) by mouth 2 (two) times daily.   aspirin 81 MG EC tablet Take 1 tablet (81 mg total) by mouth daily. Swallow whole.   atorvastatin (LIPITOR) 80 MG tablet Take 1 tablet (80 mg total) by mouth daily.   carvedilol (COREG) 25 MG tablet Take 1 tablet (25 mg total) by mouth 2 (two) times daily with a meal.   dapagliflozin propanediol ([name]) 10 MG TABS tablet Take 1 tablet (10 mg total) by mouth daily before breakfast.   ezetimibe (ZETIA) 10 MG tablet Take 1 tablet (10 mg total) by mouth daily.   fexofenadine (ALLEGRA) 180 MG tablet Take 180 mg by mouth as needed for allergies or rhinitis.   FLUoxetine (PROZAC) 20 MG capsule Take 1 capsule (20 mg total) by mouth daily.   isosorbide-hydrALAZINE (BIDIL) 20-37.5 MG tablet Take 2 tablets by mouth 3 (three) times daily.   sacubitril-valsartan (ENTRESTO) 97-103 MG Take 1 tablet by mouth 2 (two) times daily.   spironolactone (ALDACTONE)  50 MG tablet Take 1 tablet (50 mg total) by mouth daily.     Allergies:   Chocolate and Coconut (cocos nucifera) allergy skin test   Social History   Socioeconomic History   Marital status: Divorced    Spouse name: Not on file   Number of children: 0   Years of education: Not on file   Highest education level: Some college, no degree  Occupational History   Not on file  Tobacco Use   Smoking status: Never   Smokeless tobacco: Never  Vaping Use   Vaping Use: Never used  Substance and Sexual Activity   Alcohol use: Yes    Alcohol/week: 6.0 standard drinks    Types: 6 Cans of beer per week   Drug use: Never   Sexual activity: Not on file  Other Topics Concern   Not on file  Social History Narrative   Not on file   Social  Determinants of Health   Financial Resource Strain: Low Risk    Difficulty of Paying Living Expenses: Not very hard  Food Insecurity: No Food Insecurity   Worried About Running Out of Food in the Last Year: Never true   Ran Out of Food in the Last Year: Never true  Transportation Needs: No Transportation Needs   Lack of Transportation (Medical): No   Lack of Transportation (Non-Medical): No  Physical Activity: Not on file  Stress: Not on file  Social Connections: Not on file     Family History: The patient's family history is not on file.  ROS:   Please see the history of present illness.     All other systems reviewed and are negative.  EKGs/Labs/Other Studies Reviewed:    The following studies were reviewed today:    EKG:     Recent Labs: 01/23/2021: TSH 1.221 01/26/2021: Magnesium 2.0 04/29/2021: ALT 12; BUN 14; Creatinine, Ser 1.09; Potassium 4.0; Sodium 134 05/03/2021: Hemoglobin 12.0; Platelets 270  Recent Lipid Panel    Component Value Date/Time   CHOL 135 04/29/2021 0905   TRIG 77 04/29/2021 0905   HDL 46 04/29/2021 0905   CHOLHDL 2.9 04/29/2021 0905   CHOLHDL 2.2 01/24/2021 0228   VLDL 14 01/24/2021 0228   LDLCALC 74 04/29/2021 0905     Risk Assessment/Calculations:           Physical Exam:     Physical Exam: Blood pressure 135/85, pulse (!) 56, height $RemoveBe'5\' 5"'UStpkjtuq$  (1.651 m), weight 130 lb 6.4 oz (59.1 kg), SpO2 97 %.  GEN:  Well nourished, well developed in no acute distress HEENT: Normal NECK: No JVD; No carotid bruits LYMPHATICS: No lymphadenopathy CARDIAC: RRR , no murmurs, rubs, gallops RESPIRATORY:  Clear to auscultation without rales, wheezing or rhonchi  ABDOMEN: Soft, non-tender, non-distended MUSCULOSKELETAL:  No edema; No deformity  SKIN: Warm and dry NEUROLOGIC:  Alert and oriented x 3    ASSESSMENT:    No diagnosis found.  PLAN:       CAD : He is doing well.  He is not having any episodes of chest discomfort. I had a long  discussion with the patient about doing coronary artery bypass grafting.  At this point he does not want to consider it.  He wants to live as long as he can as well as he can.  He says he is right with God and does not really want to have surgery at this point.  2.  Acute on chronic combined systolic and diastolic congestive heart failure.  Echocardiogram from August shows continued improvement of his LV function. MRI reveals no evidence of LV thrombus.    Medication Adjustments/Labs and Tests Ordered: Current medicines are reviewed at length with the patient today.  Concerns regarding medicines are outlined above.  No orders of the defined types were placed in this encounter.  No orders of the defined types were placed in this encounter.    Patient Instructions  Medication Instructions:  Your physician recommends that you continue on your current medications as directed. Please refer to the Current Medication list given to you today.  *If you need a refill on your cardiac medications before your next appointment, please call your pharmacy*   Lab Work: None Ordered If you have labs (blood work) drawn today and your tests are completely normal, you will receive your results only by: [place] (if you have MyChart) OR A paper copy in the mail If you have any lab test that is abnormal or we need to change your treatment, we will call you to review the results.   Testing/Procedures: None Ordered   Follow-Up: At [facility], you and your health needs are our priority.  As part of our continuing mission to provide you with exceptional heart care, we have created designated Provider Care Teams.  These Care Teams include your primary Cardiologist (physician) and Advanced Practice Providers (APPs -  Physician Assistants and Nurse Practitioners) who all work together to provide you with the care you need, when you need it.  We recommend signing up for the patient portal called  "MyChart".  Sign up information is provided on this After Visit Summary.  MyChart is used to connect with patients for Virtual Visits (Telemedicine).  Patients are able to view lab/test results, encounter notes, upcoming appointments, etc.  Non-urgent messages can be sent to your provider as well.   To learn more about what you can do with MyChart, go to [email or web address].    Your next appointment:   6 month(s) on Thursday March 16 at 9:00 am  The format for your next appointment:   In Person  Provider:   Mertie Moores, MD       Signed, Mertie Moores, MD  07/09/2021 5:40 PM    [place]

## 2021-07-09 ENCOUNTER — Other Ambulatory Visit: Payer: Self-pay

## 2021-07-09 ENCOUNTER — Encounter: Payer: Self-pay | Admitting: Cardiovascular Disease

## 2021-07-09 ENCOUNTER — Ambulatory Visit (INDEPENDENT_AMBULATORY_CARE_PROVIDER_SITE_OTHER): Payer: Self-pay | Admitting: Cardiovascular Disease

## 2021-07-09 VITALS — BP 135/85 | HR 56 | Ht 65.0 in | Wt 130.4 lb

## 2021-07-09 DIAGNOSIS — I5040 Unspecified combined systolic (congestive) and diastolic (congestive) heart failure: Secondary | ICD-10-CM

## 2021-07-09 DIAGNOSIS — I2510 Atherosclerotic heart disease of native coronary artery without angina pectoris: Secondary | ICD-10-CM | POA: Insufficient documentation

## 2021-07-09 NOTE — Patient Instructions (Signed)
Medication Instructions:  Your physician recommends that you continue on your current medications as directed. Please refer to the Current Medication list given to you today.  *If you need a refill on your cardiac medications before your next appointment, please call your pharmacy*   Lab Work: None Ordered If you have labs (blood work) drawn today and your tests are completely normal, you will receive your results only by: MyChart Message (if you have MyChart) OR A paper copy in the mail If you have any lab test that is abnormal or we need to change your treatment, we will call you to review the results.   Testing/Procedures: None Ordered   Follow-Up: At [facility], you and your health needs are our priority.  As part of our continuing mission to provide you with exceptional heart care, we have created designated Provider Care Teams.  These Care Teams include your primary Cardiologist (physician) and Advanced Practice Providers (APPs -  Physician Assistants and Nurse Practitioners) who all work together to provide you with the care you need, when you need it.  We recommend signing up for the patient portal called "MyChart".  Sign up information is provided on this After Visit Summary.  MyChart is used to connect with patients for Virtual Visits (Telemedicine).  Patients are able to view lab/test results, encounter notes, upcoming appointments, etc.  Non-urgent messages can be sent to your provider as well.   To learn more about what you can do with MyChart, go to [email or web address].    Your next appointment:   6 month(s) on Thursday March 16 at 9:00 am  The format for your next appointment:   In Person  Provider:   [name], MD

## 2021-07-13 ENCOUNTER — Ambulatory Visit: Payer: Self-pay | Admitting: Thoracic Surgery (Cardiothoracic Vascular Surgery)

## 2021-07-19 ENCOUNTER — Other Ambulatory Visit: Payer: Self-pay

## 2021-07-26 ENCOUNTER — Other Ambulatory Visit: Payer: Self-pay

## 2021-07-27 ENCOUNTER — Institutional Professional Consult (permissible substitution): Payer: Self-pay | Admitting: Emergency Medicine

## 2021-08-02 ENCOUNTER — Other Ambulatory Visit: Payer: Self-pay

## 2021-08-09 ENCOUNTER — Other Ambulatory Visit: Payer: Self-pay

## 2021-08-16 ENCOUNTER — Other Ambulatory Visit: Payer: Self-pay

## 2021-08-23 ENCOUNTER — Other Ambulatory Visit: Payer: Self-pay

## 2021-08-25 ENCOUNTER — Other Ambulatory Visit: Payer: Self-pay

## 2021-08-25 ENCOUNTER — Ambulatory Visit (INDEPENDENT_AMBULATORY_CARE_PROVIDER_SITE_OTHER): Payer: Self-pay | Admitting: Primary Care

## 2021-08-25 ENCOUNTER — Encounter (INDEPENDENT_AMBULATORY_CARE_PROVIDER_SITE_OTHER): Payer: Self-pay | Admitting: Primary Care

## 2021-08-25 DIAGNOSIS — F331 Major depressive disorder, recurrent, moderate: Secondary | ICD-10-CM

## 2021-08-25 DIAGNOSIS — F411 Generalized anxiety disorder: Secondary | ICD-10-CM

## 2021-08-25 MED ORDER — FLUOXETINE HCL 20 MG PO CAPS
20.0000 mg | ORAL_CAPSULE | Freq: Every day | ORAL | 2 refills | Status: DC
  Filled 2021-08-25: qty 30, 30d supply, fill #0
  Filled 2021-09-27: qty 30, 30d supply, fill #1
  Filled 2021-10-25: qty 30, 30d supply, fill #0
  Filled 2021-10-25: qty 30, 30d supply, fill #2
  Filled 2022-01-14: qty 30, 30d supply, fill #1
  Filled 2022-02-17: qty 30, 30d supply, fill #2
  Filled 2022-03-24: qty 30, 30d supply, fill #3

## 2021-08-25 NOTE — Progress Notes (Signed)
I connected with  Todd Vega on 08/25/21 by a video enabled telemedicine application and verified that I am speaking with the correct person using two identifiers.   I discussed the limitations of evaluation and management by telemedicine. The patient expressed understanding and agreed to proceed. Jacquelyne Quarry Budd Palmer, CMA    CMA verified patient date of birth Location of patient: Home   Pt reports Bp of 130/83 with medication this morning

## 2021-08-25 NOTE — Progress Notes (Signed)
[facility] Telephone Note  I connected with Todd Vega, on 08/25/2021 at 9:33 AM  by telephone and verified that I am speaking with the correct person using two identifiers.   Consent: I discussed the limitations, risks, security and privacy concerns of performing an evaluation and management service by telephone and the availability of in person appointments. I also discussed with the patient that there may be a patient responsible charge related to this service. The patient expressed understanding and agreed to proceed.   Location of Patient: home  Location of Provider: [facility]   Persons participating in Telemedicine visit: Lucio Edward,  NP Cristela Felt , CMA  History of Present Illness: Todd Vega is a 60 year old male having visit for HTN . Denies shortness of breath, headaches, chest pain or lower extremity edema    Past Medical History:  Diagnosis Date   Asthma due to seasonal allergies    Allergies  Allergen Reactions   Chocolate    Coconut (Cocos Nucifera) Allergy Skin Test     Current Outpatient Medications on File Prior to Visit  Medication Sig Dispense Refill   apixaban (ELIQUIS) 5 MG TABS tablet Take 1 tablet (5 mg total) by mouth 2 (two) times daily. 60 tablet 5   aspirin 81 MG EC tablet Take 1 tablet (81 mg total) by mouth daily. Swallow whole. 30 tablet 0   atorvastatin (LIPITOR) 80 MG tablet Take 1 tablet (80 mg total) by mouth daily. 90 tablet 3   carvedilol (COREG) 25 MG tablet Take 1 tablet (25 mg total) by mouth 2 (two) times daily with a meal. 180 tablet 3   dapagliflozin propanediol ([name]) 10 MG TABS tablet Take 1 tablet (10 mg total) by mouth daily before breakfast. 30 tablet 0   ezetimibe (ZETIA) 10 MG tablet Take 1 tablet (10 mg total) by mouth daily. 90 tablet 3   fexofenadine (ALLEGRA) 180 MG tablet Take 180 mg by mouth as needed for  allergies or rhinitis.     FLUoxetine (PROZAC) 20 MG capsule Take 1 capsule (20 mg total) by mouth daily. 30 capsule 2   isosorbide-hydrALAZINE (BIDIL) 20-37.5 MG tablet Take 2 tablets by mouth 3 (three) times daily.     sacubitril-valsartan (ENTRESTO) 97-103 MG Take 1 tablet by mouth 2 (two) times daily. 180 tablet 3   spironolactone (ALDACTONE) 50 MG tablet Take 1 tablet (50 mg total) by mouth daily. 30 tablet 5   No current facility-administered medications on file prior to visit.    Observations/Objective: There were no vitals taken for this visit.   Assessment and Plan: [name] was seen today for blood pressure check.  Diagnoses and all orders for this visit:  Moderate episode of recurrent major depressive disorder (HCC) -     FLUoxetine (PROZAC) 20 MG capsule; Take 1 capsule (20 mg total) by mouth daily.  Generalized anxiety disorder -     FLUoxetine (PROZAC) 20 MG capsule; Take 1 capsule (20 mg total) by mouth daily.    Follow Up Instructions: 3 months   I discussed the assessment and treatment plan with the patient. The patient was provided an opportunity to ask questions and all were answered. The patient agreed with the plan and demonstrated an understanding of the instructions.   The patient was advised to call back or seek an in-person evaluation if the symptoms worsen or if the condition fails to improve as anticipated.  I provided 10 minutes total of non-face-to-face time during this encounter including median intraservice time, reviewing previous notes, investigations, ordering medications, medical decision making, coordinating care and patient verbalized understanding at the end of the visit.    This note has been created with Education officer, environmental. Any transcriptional errors are unintentional.   [name], NP 08/25/2021, 9:33 AM

## 2021-09-01 ENCOUNTER — Telehealth: Payer: Self-pay | Admitting: Cardiovascular Disease

## 2021-09-01 NOTE — Telephone Encounter (Signed)
Per Dr. Elease Hashimoto:  Dr. Ashley Royalty recently called me and asked if he could be prescribed Viagra or Cialis.  Currently Todd Vega is on isosorbide which is an absolute contraindication for giving Viagra or Cialis.  I advised her to increase the dose of hydralazine and to discontinue the isosorbide.  At that point she should be able to give him a prescription for Viagra or Cialis.   She said she would make the necessary changes.    Pt advised and will talk with his PCP Dr. Ashley Royalty re: all of his options.

## 2021-09-01 NOTE — Telephone Encounter (Signed)
Will forward to Dr. Elease Hashimoto for his review... last OV 07/09/21... pt has CAD... Echo 05/20/21... cardiac MRA 05/2021.

## 2021-09-01 NOTE — Telephone Encounter (Signed)
Patient would like to know if he can still have sex.

## 2021-09-06 ENCOUNTER — Other Ambulatory Visit: Payer: Self-pay

## 2021-09-13 ENCOUNTER — Other Ambulatory Visit: Payer: Self-pay

## 2021-09-14 ENCOUNTER — Other Ambulatory Visit: Payer: Self-pay

## 2021-09-20 ENCOUNTER — Other Ambulatory Visit: Payer: Self-pay

## 2021-09-24 ENCOUNTER — Other Ambulatory Visit: Payer: Self-pay

## 2021-09-24 ENCOUNTER — Ambulatory Visit (INDEPENDENT_AMBULATORY_CARE_PROVIDER_SITE_OTHER): Payer: Self-pay | Admitting: Primary Care

## 2021-09-24 ENCOUNTER — Encounter (INDEPENDENT_AMBULATORY_CARE_PROVIDER_SITE_OTHER): Payer: Self-pay | Admitting: Primary Care

## 2021-09-24 VITALS — BP 138/79 | HR 74 | Temp 97.7°F | Ht 65.0 in | Wt 123.0 lb

## 2021-09-24 DIAGNOSIS — I5040 Unspecified combined systolic (congestive) and diastolic (congestive) heart failure: Secondary | ICD-10-CM

## 2021-09-24 DIAGNOSIS — F5221 Male erectile disorder: Secondary | ICD-10-CM

## 2021-09-24 MED ORDER — TADALAFIL 5 MG PO TABS
5.0000 mg | ORAL_TABLET | Freq: Every day | ORAL | 3 refills | Status: DC
  Filled 2021-09-24: qty 30, 30d supply, fill #0

## 2021-09-24 MED ORDER — HYDRALAZINE HCL 25 MG PO TABS
25.0000 mg | ORAL_TABLET | Freq: Three times a day (TID) | ORAL | 3 refills | Status: DC
  Filled 2021-09-24 – 2021-10-25 (×2): qty 90, 30d supply, fill #0
  Filled 2021-10-25: qty 90, 30d supply, fill #1

## 2021-09-24 NOTE — Patient Instructions (Signed)
Tadalafil Tablets (Erectile Dysfunction, BPH) ?What is this medication? ?TADALAFIL (tah DA la fil) treats erectile dysfunction (ED). It works by increasing blood flow to the penis, which helps to maintain an erection. It may also be used to treat symptoms of an enlarged prostate (benign prostatic hyperplasia). ?This medicine may be used for other purposes; ask your health care provider or pharmacist if you have questions. ?COMMON BRAND NAME(S): Adcirca, ALYQ, Cialis ?What should I tell my care team before I take this medication? ?They need to know if you have any of these conditions: ?Anatomical deformation of the penis, Peyronie's disease, or history of priapism (painful and prolonged erection) ?Bleeding disorders ?Eye or vision problems, including a rare inherited eye disease called retinitis pigmentosa ?Heart disease, angina, a history of heart attack, irregular heart beats, or other heart problems ?High or low blood pressure ?History of blood diseases, like sickle cell anemia or leukemia ?History of stomach bleeding ?Kidney disease ?Liver disease ?Stroke ?An unusual or allergic reaction to tadalafil, other medications, foods, dyes, or preservatives ?Pregnant or trying to get pregnant ?Breast-feeding ?How should I use this medication? ?Take this medication by mouth with a glass of water. Follow the directions on the prescription label. You may take this medication with or without meals. When this medication is used for erection problems, your care team may prescribe it to be taken once daily or as needed. If you are taking the medication as needed, you may be able to have sexual activity 30 minutes after taking it and for up to 36 hours after taking it. Whether you are taking the medication as needed or once daily, you should not take more than one dose per day. If you are taking this medication for symptoms of benign prostatic hyperplasia (BPH) or to treat both BPH and an erection problem, take the dose once  daily at about the same time each day. Do not take your medication more often than directed. ?Talk to your care team about the use of this medication in children. Special care may be needed. ?Overdosage: If you think you have taken too much of this medicine contact a poison control center or emergency room at once. ?NOTE: This medicine is only for you. Do not share this medicine with others. ?What if I miss a dose? ?If you are taking this medication as needed for erection problems, this does not apply. If you miss a dose while taking this medication once daily for an erection problem, benign prostatic hyperplasia, or both, take it as soon as you remember, but do not take more than one dose per day. ?What may interact with this medication? ?Do not take this medication with any of the following: ?Nitrates like amyl nitrite, isosorbide dinitrate, isosorbide mononitrate, nitroglycerin ?Other medications for erectile dysfunction like avanafil, sildenafil, vardenafil ?Other tadalafil products (Adcirca) ?Riociguat ?This medication may also interact with the following: ?Certain medications for high blood pressure ?Certain medications for the treatment of HIV infection or AIDS ?Certain medications used for fungal or yeast infections, like fluconazole, itraconazole, ketoconazole, and voriconazole ?Certain medications used for seizures like carbamazepine, phenytoin, and phenobarbital ?Grapefruit juice ?Macrolide antibiotics like clarithromycin, erythromycin, troleandomycin ?Medications for prostate problems ?Rifabutin, rifampin or rifapentine ?This list may not describe all possible interactions. Give your health care provider a list of all the medicines, herbs, non-prescription drugs, or dietary supplements you use. Also tell them if you smoke, drink alcohol, or use illegal drugs. Some items may interact with your medicine. ?What should I watch for   while using this medication? ?If you notice any changes in your vision while  taking this medication, call your care team as soon as possible. Stop using this medication and call your care team right away if you have a loss of sight in one or both eyes. ?Contact your care team right away if the erection lasts longer than 4 hours or if it becomes painful. This may be a sign of serious problem and must be treated right away to prevent permanent damage. ?If you experience symptoms of nausea, dizziness, chest pain or arm pain upon initiation of sexual activity after taking this medication, you should refrain from further activity and call your care team as soon as possible. ?Do not drink alcohol to excess (examples, 5 glasses of wine or 5 shots of whiskey) when taking this medication. When taken in excess, alcohol can increase your chances of getting a headache or getting dizzy, increasing your heart rate or lowering your blood pressure. ?Using this medication does not protect you or your partner against HIV infection (the virus that causes AIDS) or other sexually transmitted diseases. ?What side effects may I notice from receiving this medication? ?Side effects that you should report to your care team as soon as possible: ?Allergic reactions--skin rash, itching, hives, swelling of the face, lips, tongue, or throat ?Hearing loss or ringing in ears ?Heart attack--pain or tightness in the chest, shoulders, arms, or jaw, nausea, shortness of breath, cold or clammy skin, feeling faint or lightheaded ?Low blood pressure--dizziness, feeling faint or lightheaded, blurry vision ?Prolonged or painful erection ?Redness, blistering, peeling, or loosening of the skin, including inside the mouth ?Stroke--sudden numbness or weakness of the face, arm, or leg, trouble speaking, confusion, trouble walking, loss of balance or coordination, dizziness, severe headache, change in vision ?Sudden vision loss in one or both eyes ?Side effects that usually do not require medical attention (report to your care team if  they continue or are bothersome): ?Back pain ?Facial flushing or redness ?Headache ?Muscle pain ?Runny or stuffy nose ?Upset stomach ?This list may not describe all possible side effects. Call your doctor for medical advice about side effects. You may report side effects to FDA at 1-800-FDA-1088. ?Where should I keep my medication? ?Keep out of the reach of children. ?Store at room temperature between 15 and 30 degrees C (59 and 86 degrees F). Throw away any unused medication after the expiration date. ?NOTE: This sheet is a summary. It may not cover all possible information. If you have questions about this medicine, talk to your doctor, pharmacist, or health care provider. ?? 2022 Elsevier/Gold Standard (2020-12-17 00:00:00) ? ?

## 2021-09-24 NOTE — Progress Notes (Signed)
Subjective:     Todd Vega is a 60 y.o. male here for treatment of erectile dysfunction. Onset of dysfunction was 8 months ago and onset was after CVA. Patient states he has difficulty both attaining and maintaining erection. Full erections occur never. Partial erections occur never. Libido is affected. Risk factors for ED include beta blocker use and cardiovascular disease:   . Patient denies history of cranial, spinal, or pelvic trauma, diabetes mellitus, and hypogonadism. Patient's expectations of sexual function yes . Patient describes relationship with partner as fine. Previous treatment of ED includes none The following portions of the patient's history were reviewed and updated as appropriate: allergies, current medications, past family history, past medical history, past social history, and past surgical history. PCP contacted cardiologist for advised on medication for ED. advised and instructions given. Review of Systems Pertinent items noted in HPI and remainder of comprehensive ROS otherwise negative.    Objective:    BP 138/79 (BP Location: Right Arm, Patient Position: Sitting, Cuff Size: Small)   Pulse 74   Temp 97.7 F (36.5 C) (Temporal)   Ht 5\' 5"  (1.651 m)   Wt 123 lb (55.8 kg)   SpO2 97%   BMI 20.47 kg/m   Assessment:  Physical exam: General: Vital signs reviewed.  Patient is well-developed and well-nourished, under weight male  in no acute distress and cooperative with exam. Head: Normocephalic and atraumatic. Eyes: EOMI, conjunctivae normal, no scleral icterus. Neck: Supple, trachea midline, normal ROM, no JVD, masses, thyromegaly, or carotid bruit present. Cardiovascular: RRR, S1 normal, S2 normal, no murmurs, gallops, or rubs. Pulmonary/Chest: Clear to auscultation bilaterally, no wheezes, rales, or rhonchi. Abdominal: Soft, non-tender, non-distended, BS +, no masses, organomegaly, or guarding present. Musculoskeletal: No joint deformities, erythema, or  stiffness, ROM full and nontender. Extremities: No lower extremity edema bilaterally,  pulses symmetric and intact bilaterally. No cyanosis or clubbing. Neurological: A&O x3, Strength is normal Skin: Warm, dry and intact. No rashes or erythema. Psychiatric: Normal mood and affect. speech and behavior is normal. Cognition and memory are normal.     Sexual dysfunction contributed to by the following factors:  CVA    Plan:  Per Dr. :  Todd Hashimoto Np recently called me and asked if he could be prescribed Viagra or Cialis.  Currently Todd Vega is on isosorbide which is an absolute contraindication for giving Viagra or Cialis.  I advised her to increase the dose of hydralazine and to discontinue the isosorbide.  At that point she should be able to give him a prescription for Viagra or Cialis.   She said she would make the necessary changes.  Discontinued isosorbide hydralazine 25 mg twice daily no other changes in medication and prescribe Cialis 5 mg.  Instructed patient may take up to 10 mg.  If initial dose is taking may repeat in 30 minutes and no more repeats.   Reviewed pathophysiology and differential diagnosis of erectile dysfunction with the patient. Treatment options, including relative risks and benefits, were discussed. All questions were answered. Trial of Viagra; no contraindications.   Todd Vega

## 2021-09-27 ENCOUNTER — Other Ambulatory Visit: Payer: Self-pay

## 2021-10-04 ENCOUNTER — Other Ambulatory Visit: Payer: Self-pay

## 2021-10-12 ENCOUNTER — Other Ambulatory Visit: Payer: Self-pay

## 2021-10-12 MED ORDER — ENTRESTO 97-103 MG PO TABS
1.0000 | ORAL_TABLET | Freq: Two times a day (BID) | ORAL | 2 refills | Status: DC
  Filled 2021-10-12: qty 60, 30d supply, fill #0

## 2021-10-12 NOTE — Telephone Encounter (Signed)
Pt's medication was sent to pt's pharmacy as requested. Confirmation received.  °

## 2021-10-18 ENCOUNTER — Other Ambulatory Visit: Payer: Self-pay

## 2021-10-21 ENCOUNTER — Telehealth (INDEPENDENT_AMBULATORY_CARE_PROVIDER_SITE_OTHER): Payer: Self-pay | Admitting: Primary Care

## 2021-10-21 NOTE — Telephone Encounter (Signed)
Pt called to let Todd Vega know that the Rx for tadalafil (CIALIS) 5 MG tablet is not working and Todd Vega asked if there is another option/ if there is not another option Is it ok if Todd Vega goes back to taking his regular BP medication / please advise

## 2021-10-21 NOTE — Telephone Encounter (Signed)
Sent to PCP ?

## 2021-10-22 ENCOUNTER — Other Ambulatory Visit (INDEPENDENT_AMBULATORY_CARE_PROVIDER_SITE_OTHER): Payer: Self-pay | Admitting: Primary Care

## 2021-10-22 ENCOUNTER — Other Ambulatory Visit: Payer: Self-pay

## 2021-10-22 DIAGNOSIS — F5221 Male erectile disorder: Secondary | ICD-10-CM

## 2021-10-22 MED ORDER — APIXABAN 5 MG PO TABS
5.0000 mg | ORAL_TABLET | Freq: Two times a day (BID) | ORAL | 5 refills | Status: DC
  Filled 2021-10-22: qty 60, 30d supply, fill #0

## 2021-10-22 MED ORDER — TADALAFIL 10 MG PO TABS
10.0000 mg | ORAL_TABLET | Freq: Every day | ORAL | 0 refills | Status: DC
  Filled 2021-10-22: qty 10, 30d supply, fill #0

## 2021-10-22 NOTE — Progress Notes (Signed)
Called patient reminded him I spoke with cardiologist and medication changes were made per his recommendations. Patient states Cialis 5mg  does not work- will increase to 10mg  same instructions

## 2021-10-23 ENCOUNTER — Other Ambulatory Visit: Payer: Self-pay

## 2021-10-25 ENCOUNTER — Other Ambulatory Visit: Payer: Self-pay

## 2021-11-02 ENCOUNTER — Other Ambulatory Visit: Payer: Self-pay | Admitting: Cardiovascular Disease

## 2021-11-02 MED ORDER — SPIRONOLACTONE 50 MG PO TABS
50.0000 mg | ORAL_TABLET | Freq: Every day | ORAL | 8 refills | Status: DC
  Filled 2021-11-02 (×2): qty 30, 30d supply, fill #0
  Filled 2021-12-10: qty 30, 30d supply, fill #1
  Filled 2022-01-14: qty 30, 30d supply, fill #2
  Filled 2022-01-26: qty 30, 30d supply, fill #3
  Filled 2022-02-22: qty 30, 30d supply, fill #4
  Filled 2022-03-24: qty 30, 30d supply, fill #5

## 2021-11-03 ENCOUNTER — Other Ambulatory Visit: Payer: Self-pay

## 2021-11-18 ENCOUNTER — Other Ambulatory Visit: Payer: Self-pay

## 2021-11-19 ENCOUNTER — Telehealth (INDEPENDENT_AMBULATORY_CARE_PROVIDER_SITE_OTHER): Payer: Self-pay

## 2021-11-19 NOTE — Telephone Encounter (Signed)
Copied from CRM [id number]. Topic: Quick Communication - Rx Refill/Question >> Nov 19, 2021  2:40 PM Pawlus, Maxine Glenn A wrote: Pt stated he does not think his tadalafil (CIALIS) 10 MG tablet is working and requested he be put back on his old medication, pt requested a call back   Please advise if medication can be changed. Saw a note that stated this increase was agreed upon by patients cardiologist. Patient states he does not think it is working. Maryjean Morn, CMA

## 2021-11-22 NOTE — Telephone Encounter (Signed)
Patient is aware to contact cardiologist to discuss another option. Per PCP this is the only medication she can prescribe that will not interact with his heart medications.

## 2021-11-22 NOTE — Telephone Encounter (Signed)
Will need to discuss with cardiology.

## 2021-12-01 ENCOUNTER — Ambulatory Visit: Payer: Self-pay | Admitting: Adult Health

## 2021-12-02 ENCOUNTER — Telehealth: Payer: Self-pay | Admitting: Cardiovascular Disease

## 2021-12-02 NOTE — Telephone Encounter (Signed)
Spoke with pt who reports Cialis 10mg  was prescribed by his PCP and it is not working for him.  Pt states he contacted PCP and was advised to contact cardiology for other ED medication options.  Pt is scheduled to see Dr 12/30/2021.  Pt advised will forward request for further review and recommendation.   Pt verbalizes understanding and agrees with current plan.

## 2021-12-02 NOTE — Telephone Encounter (Signed)
Pt's isosorbide was discontinued 09/01/21 phone note so that he could take PDE5 inhibitor for ED. Dr Elease Hashimoto deferred to PCP for rx. If Cialis did not work, could increase dose to 20mg  prn or could try Viagra next.

## 2021-12-02 NOTE — Telephone Encounter (Signed)
Patient called to talk with Dr. Elease Hashimoto or nurse. Please call back

## 2021-12-10 ENCOUNTER — Other Ambulatory Visit: Payer: Self-pay

## 2021-12-13 ENCOUNTER — Other Ambulatory Visit: Payer: Self-pay

## 2021-12-16 ENCOUNTER — Telehealth: Payer: Self-pay | Admitting: Cardiovascular Disease

## 2021-12-16 NOTE — Telephone Encounter (Signed)
Spoke with the patient and gave him advise from [facility] (see below). Advised that he should still reach out to his PCP as Dr. Acie Fredrickson is not going to prescribe it for him, however these are our recommendations. Patient verbalized understanding.  ? ?Supple, Bogard, RPH-CPP ?  ? ?Pt's isosorbide was discontinued 09/01/21 phone note so that he could take PDE5 inhibitor for ED. Dr Acie Fredrickson deferred to PCP for rx. If Cialis did not work, could increase dose to 20mg  prn or could try Viagra next.  ?  ? ? ?

## 2021-12-16 NOTE — Telephone Encounter (Signed)
Pt c/o medication issue: ? ?1. Name of Medication: tadalafil (CIALIS) 10 MG tablet ? ?2. How are you currently taking this medication (dosage and times per day)? Take 1 tablet (10 mg total) by mouth daily. ? ?3. Are you having a reaction (difficulty breathing--STAT)? NO ? ?4. What is your medication issue?  ? ?Pt states that this medication isnt working. Please advise further ? ?

## 2021-12-23 ENCOUNTER — Other Ambulatory Visit: Payer: Self-pay

## 2021-12-24 ENCOUNTER — Other Ambulatory Visit: Payer: Self-pay

## 2021-12-30 ENCOUNTER — Encounter: Payer: Self-pay | Admitting: Cardiovascular Disease

## 2021-12-30 ENCOUNTER — Other Ambulatory Visit: Payer: Self-pay

## 2021-12-30 ENCOUNTER — Ambulatory Visit (INDEPENDENT_AMBULATORY_CARE_PROVIDER_SITE_OTHER): Payer: Self-pay | Admitting: Cardiovascular Disease

## 2021-12-30 VITALS — BP 138/90 | HR 72 | Ht 65.0 in | Wt 125.0 lb

## 2021-12-30 DIAGNOSIS — E7849 Other hyperlipidemia: Secondary | ICD-10-CM

## 2021-12-30 LAB — BASIC METABOLIC PANEL
BUN/Creatinine Ratio: 13 (ref 10–24)
BUN: 11 mg/dL (ref 8–27)
CO2: 28 mmol/L (ref 20–29)
Calcium: 8.2 mg/dL — ABNORMAL LOW (ref 8.6–10.2)
Chloride: 102 mmol/L (ref 96–106)
Creatinine, Ser: 0.83 mg/dL (ref 0.76–1.27)
Glucose: 136 mg/dL — ABNORMAL HIGH (ref 70–99)
Potassium: 3.7 mmol/L (ref 3.5–5.2)
Sodium: 139 mmol/L (ref 134–144)
eGFR: 100 mL/min/{1.73_m2} (ref >59)

## 2021-12-30 LAB — LIPID PANEL
Chol/HDL Ratio: 1.5 ratio (ref 0.0–5.0)
Cholesterol, Total: 112 mg/dL (ref 100–199)
HDL: 74 mg/dL (ref >39)
LDL Chol Calc (NIH): 27 mg/dL (ref 0–99)
Triglycerides: 45 mg/dL (ref 0–149)
VLDL Cholesterol Cal: 11 mg/dL (ref 5–40)

## 2021-12-30 LAB — ALT: ALT: 22 IU/L (ref 0–44)

## 2021-12-30 MED ORDER — ISOSORB DINITRATE-HYDRALAZINE 20-37.5 MG PO TABS
2.0000 | ORAL_TABLET | Freq: Three times a day (TID) | ORAL | 3 refills | Status: DC
  Filled 2021-12-30 (×2): qty 180, 30d supply, fill #0

## 2021-12-30 NOTE — Patient Instructions (Signed)
Medication Instructions:  ?Your physician has recommended you make the following change in your medication:  ? ?1) STOP Cialis ?2) STOP Hydralazine ?3) START BiDil 20-37.5mg  three times daily ? ?*If you need a refill on your cardiac medications before your next appointment, please call your pharmacy* ? ?Lab Work: ?TODAY: Lipids, ALT, BMP ?If you have labs (blood work) drawn today and your tests are completely normal, you will receive your results only by: ?MyChart Message (if you have MyChart) OR ?A paper copy in the mail ?If you have any lab test that is abnormal or we need to change your treatment, we will call you to review the results. ? ?Testing/Procedures: ?NONE ? ?Follow-Up: ?At [facility], you and your health needs are our priority.  As part of our continuing mission to provide you with exceptional heart care, we have created designated Provider Care Teams.  These Care Teams include your primary Cardiologist (physician) and Advanced Practice Providers (APPs -  Physician Assistants and Nurse Practitioners) who all work together to provide you with the care you need, when you need it. ? ?We recommend signing up for the patient portal called "MyChart".  Sign up information is provided on this After Visit Summary.  MyChart is used to connect with patients for Virtual Visits (Telemedicine).  Patients are able to view lab/test results, encounter notes, upcoming appointments, etc.  Non-urgent messages can be sent to your provider as well.   ?To learn more about what you can do with MyChart, go to [email or web address].   ? ?Your next appointment:   ?1 year(s) ? ?The format for your next appointment:   ?In Person ? ?Provider:   ?Mertie Moores, MD  or Robbie Lis, PA-C or Richardson Dopp, [place] ?

## 2021-12-30 NOTE — Progress Notes (Signed)
?Cardiology Office Note:   ? ?Date:  12/30/2021  ? ?ID:  Todd Vega, DOB 1960-12-13, MRN [id number] ? ?PCP:  Kerin Perna, NP ?  ?[facility] Providers ?Cardiologist:  Mertie Moores, MD { ?  ? ?Referring MD: Kerin Perna, NP  ? ?Chief Complaint  ?Patient presents with  ? Congestive Heart Failure  ? ? ?May 03, 2021  ? ?Todd Vega is a 61 y.o. male with a hx of CVA , CHF ?Seen with Larene Beach ( niece )  ?I met him in April 2022 for eval of his CHF ?Echo showed EF 20-25%. ?Cath revealed severe CAD  ?Echo revealed LV thrombus ?Was seen by Dr. Roxy Manns and CABG was discussed ?He did not want CABG at that time ?I saw him in May, 2022 and he still did not want to consider CABG. ?He has been on Entresto, coreg, spironolactone ?He is on eliquis for his LV thrombus and hx of CVA ?Has been compliant with eliquis  ? 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 ? ?Sept. 23, 2022: ?Todd Vega is seen today for follow up visit , here with Tharon Aquas ( sister)  ?Hx of CVA, CAD, CHF ?His LV thrombus has resolved. ?Cardiac MRI showed viable myocardium with EF 36%. ?He has not had any angina. ?He has been seen by Dr. Koleen Nimrod who offered CABG . ?Mr. Pacitti did not want to pursue CABG, ?He is here for further discussion ?Is back working  ( is a Biomedical scientist)  ?No CP , no dyspnea   ? ?December 30, 2021: ?Todd Vega is seen today for follow up of his coronary artery disease, CHF, CVA.  He is here alone. ?He has a history of LV thrombus which had resolved by previous studies.  Continue eliquis  ? ?Cardiac MRI showed viable myocardium with an ejection fraction of 36%. ?He has been considered for CABG and has seen Dr. Koleen Nimrod. ?He decided not to have coronary artery bypass grafting at that time. ? ?Hx of CHF ?On Entresto 97-103 BID ?Hydralazine 25 TID ?Spironolactone 50 mg a day  ?[name]  10 QD  ? ?Has been taking Cialis occasionally  - he has stopped  the imdur - no cp after taking  ?Says it doesn't work very well  ?Will DC cialis and  ?Restart Bidil 20/37.5  - 2 tabs TID  ? 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 ?Walks around the block and works out with [employer] on occasion  ? ? ?Past Medical History:  ?Diagnosis Date  ? Asthma due to seasonal allergies   ? ? ?Past Surgical History:  ?Procedure Laterality Date  ? BUBBLE STUDY  01/25/2021  ? Procedure: BUBBLE STUDY;  Surgeon: Buford Dresser, MD;  Location: [facility] [place];  Service: Cardiovascular;;  ? RIGHT HEART CATH AND CORONARY ANGIOGRAPHY N/A 01/27/2021  ? Procedure: RIGHT HEART CATH AND CORONARY ANGIOGRAPHY;  Surgeon: Belva Crome, MD;  Location: [facility];  Service: Cardiovascular;  Laterality: N/A;  ? TEE WITHOUT CARDIOVERSION N/A 01/25/2021  ? Procedure: TRANSESOPHAGEAL ECHOCARDIOGRAM (TEE);  Surgeon: Buford Dresser, MD;  Location: [facility];  Service: Cardiovascular;  Laterality: N/A;  ? ? ?Current Medications: ?Current Meds  ?Medication Sig  ? apixaban (ELIQUIS) 5 MG TABS tablet Take 5 mg by mouth 2 (two) times daily.  ?  aspirin 81 MG EC tablet Take 1 tablet (81 mg total) by mouth daily. Swallow whole.  ? atorvastatin (LIPITOR) 80 MG tablet Take 1 tablet (80 mg total) by mouth daily.  ? carvedilol (COREG) 25 MG tablet Take 1 tablet (25 mg total) by mouth 2 (two) times daily with a meal.  ? dapagliflozin propanediol ([name]) 10 MG TABS tablet Take 1 tablet (10 mg total) by mouth daily before breakfast.  ? fexofenadine (ALLEGRA) 180 MG tablet Take 180 mg by mouth as needed for allergies or rhinitis.  ? FLUoxetine (PROZAC) 20 MG capsule Take 1 capsule (20 mg total) by mouth daily.  ? isosorbide-hydrALAZINE (BIDIL) 20-37.5 MG tablet Take 2 tablets by mouth 3 (three) times daily.  ? sacubitril-valsartan (ENTRESTO) 97-103 MG Take 1 tablet by mouth 2 (two) times daily.  ? spironolactone (ALDACTONE) 50 MG tablet Take 1 tablet (50 mg total) by mouth  daily.  ? [DISCONTINUED] hydrALAZINE (APRESOLINE) 25 MG tablet Take 1 tablet (25 mg total) by mouth 3 (three) times daily.  ? [DISCONTINUED] tadalafil (CIALIS) 10 MG tablet Take 1 tablet (10 mg total) by mouth daily.  ?  ? ?Allergies:   Chocolate and Coconut (cocos nucifera) allergy skin test  ? ?Social History  ? ?Socioeconomic History  ? Marital status: Divorced  ?  Spouse name: Not on file  ? Number of children: 0  ? Years of education: Not on file  ? Highest education level: Some college, no degree  ?Occupational History  ? Not on file  ?Tobacco Use  ? Smoking status: Never  ? Smokeless tobacco: Never  ?Vaping Use  ? Vaping Use: Never used  ?Substance and Sexual Activity  ? Alcohol use: Yes  ?  Alcohol/week: 6.0 standard drinks  ?  Types: 6 Cans of beer per week  ? Drug use: Never  ? Sexual activity: Not on file  ?Other Topics Concern  ? Not on file  ?Social History Narrative  ? Not on file  ? ?Social Determinants of Health  ? ?Financial Resource Strain: Low Risk   ? Difficulty of Paying Living Expenses: Not very hard  ?Food Insecurity: No Food Insecurity  ? Worried About Charity fundraiser in the Last Year: Never true  ? Ran Out of Food in the Last Year: Never true  ?Transportation Needs: No Transportation Needs  ? Lack of Transportation (Medical): No  ? Lack of Transportation (Non-Medical): No  ?Physical Activity: Not on file  ?Stress: Not on file  ?Social Connections: Not on file  ?  ? ?Family History: ?The patient's family history is not on file. ? ?ROS:   ?Please see the history of present illness.    ? All other systems reviewed and are negative. ? ?EKGs/Labs/Other Studies Reviewed:   ? ?The following studies were reviewed today: ?  ? ?EKG:   December 30, 2021: Normal sinus rhythm at 72.  Old anterior wall myocardial infarction.  No ST or T wave changes. ? ?Recent Labs: ?01/23/2021: TSH 1.221 ?01/26/2021: Magnesium 2.0 ?04/29/2021: ALT 12; BUN 14; Creatinine, Ser 1.09; Potassium 4.0; Sodium 134 ?05/03/2021:  Hemoglobin 12.0; Platelets 270  ?Recent Lipid Panel ?   ?Component Value Date/Time  ? CHOL 135 04/29/2021 0905  ? TRIG 77 04/29/2021 0905  ? HDL 46 04/29/2021 0905  ? CHOLHDL 2.9 04/29/2021 0905  ? CHOLHDL 2.2 01/24/2021 0228  ? VLDL 14 01/24/2021 0228  ? [place] 74 04/29/2021 0905  ? ? ? ?Risk Assessment/Calculations:   ?  ? ?    ? ?  Physical Exam:   ? ? ?Physical Exam: ?Blood pressure 138/90, pulse 72, height $RemoveBe'5\' 5"'oUyYsEMGF$  (1.651 m), weight 125 lb (56.7 kg). ? ?GEN:    thin, middle age man  no acute distress ?HEENT: Normal ?NECK: No JVD; No carotid bruits ?LYMPHATICS: No lymphadenopathy ?CARDIAC: RRR , no murmurs, rubs, gallops ?RESPIRATORY:  Clear to auscultation without rales, wheezing or rhonchi  ?ABDOMEN: Soft, non-tender, non-distended ?MUSCULOSKELETAL:  No edema; No deformity  ?SKIN: Warm and dry ?NEUROLOGIC:  Alert and oriented x 3 ? ? ? ?ASSESSMENT:   ? ?1. Coronary artery disease involving native coronary artery without angina pectoris, unspecified whether native or transplanted heart   ?2. Hypertension, unspecified type   ?3. Chronic combined systolic and diastolic CHF (congestive heart failure) ([place])   ?4. Other hyperlipidemia   ? ? ?PLAN:   ? ? ? ? CAD : He has severe coronary artery disease.  He does not want to have coronary artery bypass grafting.  Continue medical therapy.  I have instructed him to let me know if he starts having episodes of chest discomfort.  He has already seen Dr. Roxan Hockey and bypass grafting  was recommended.  He decided not to have the surgery. ?  ? ?2.  Acute on chronic combined systolic and diastolic congestive heart failure.    ?He was previously on BiDil 20/37.5 mg tablets-2 tablets 3 times daily.  We discontinued the BiDil and started him on plain hydralazine when he wanted to try Cialis.  He states that the Cialis is not really working for him so he wants to go back on the BiDil.  We will discontinue Cialis, discontinue hydralazine, restart BiDil 20/37.5 mg tablets-2 tablets 3  times daily. ? ?3.  Hyperlipidemia: Check lipids, ALT, basic metabolic profile today. ? ? ? ? ?Medication Adjustments/Labs and Tests Ordered: ?Current medicines are reviewed at length with the patient today.

## 2021-12-31 ENCOUNTER — Other Ambulatory Visit: Payer: Self-pay

## 2022-01-03 ENCOUNTER — Telehealth: Payer: Self-pay | Admitting: Cardiovascular Disease

## 2022-01-03 NOTE — Telephone Encounter (Signed)
?  Pt is returning call to get lab result. Pt said, he is at work and call him back after 2:30 pm today ?

## 2022-01-03 NOTE — Telephone Encounter (Addendum)
Vesta Mixer, MD  ?01/02/2022  8:43 PM EDT   ?  ?Glucose is elevated  ?BMP is stable ?Lipids look great ?ALT looks great  ? ?Spoke with pt and advised of lab results per Dr Elease Hashimoto.  Pt verbalizes understanding and thanked Charity fundraiser for the call. ?

## 2022-01-04 NOTE — Progress Notes (Signed)
?[facility] ?[id number] ?[place] [id number] ?[phone] ? ?     STROKE FOLLOW UP NOTE ? ?[name] ?Date of Birth:  02/05/1961 ?Medical Record Number:  [id number]  ? ?Reason for Referral: stroke follow up ? ? ? ?SUBJECTIVE: ? ? ?CHIEF COMPLAINT:  ?Chief Complaint  ?Patient presents with  ? Follow-up  ?  RM 2 alone  ?Pt is well and stable, no new concerns   ? ? ? ?HPI:  ? ?Update 01/04/2022 JM: 61 year old male who returns for stroke follow-up after prior visit 7 months ago unaccompanied.  Overall stable without new stroke/TIA symptoms.  Reports residual word finding difficulty but overall greatly improved, can be worse with anxiety or pressured speech.  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  Compliant on Eliquis, aspirin, atorvastatin and Zetia, denies side effects.  Blood pressure today elevated at 170/88 and on recheck 160/90, monitors at home and typically between 120-150/80-90s. Recently started on Bidil per cardiology.  Recent visit with cards Dr. Melburn PopperNasher - per OV note, cardiac MRI showed EF of 36%, considered CABG for CAD but patient decided not to pursue and continues with medical management.  Closely followed by PCP. Recent lipid panel showed LDL 27.  No further concerns at this time. ? ? ? ? ?History provided for reference purposes only  ?Update 05/31/2021 JM: [name] returns for 2848-month stroke follow-up accompanied by his sister, Tomma LightningFrankie.  Overall stable.  Reports residual speech and language difficulty but greatly improved since prior visit.  Denies residual weakness.  Denies new stroke/TIA symptoms.  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  Compliant on Eliquis, aspirin, atorvastatin and Zetia -denies associated side effects.  Blood pressure today elevated although routinely monitors at home typically 120s-130s/60s and HR 60-70s. Started on new BP med last month with occasional mild  headaches which cardiology is aware of.  Repeat 2D echo 8/4 showed improvement of EF.  Has cardiac MRI scheduled 8/25 and f/u with cardiothoracic 9/6.  No further concerns at this time. ? ?Initial visit 02/25/2021 JM: [name] is being seen for hospital follow-up accompanied by his sister, Tomma LightningFrankie.  Doing well since discharge with residual expressive aphasia and mild hand weakness with inmprovement. [facility] but since [facility]. Currently in the process of [facility] assistance as well as long-term disability.  He continues to do exercises at home.  Currently living with his sister but is able to maintain ADLs and majority of IADLs independently.  Denies cognitive deficits or memory concerns.  Denies new stroke/TIA symptoms. ? ?Remains on aspirin and Eliquis as well as atorvastatin without associated side effects ?Blood pressure today 184/120 - currently working with cardiology for medication adjustment with recent dosage increase of Entresto and continued use of carvedilol and spironolactone ? ?No further concerns at this time ? ? ?Stroke admission 01/23/2021 ?Mr. Todd SauceDavid Vega is a 61 y.o. male w/pmh of alcohol use and medical non compliance who [facility]23/[facility]21 after awakening in the night with onset of aphasia and facial droop.  Personally reviewed hospitalization pertinent progress notes, lab work and imaging with summary provided.  Evaluated by Dr. 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  Initial 2D echo showed EF 25 to 30% 4/9 and 2D echo limited 4/12 showed concern of early thrombus formation in the apex and  LV EF <20%.  Initially on heparin and transition to Eliquis recommend 69-month duration at discharge as well as aspirin.  CTA head/neck without significant atherosclerosis.  TEE 4/11 no evidence of cardiac source of embolism or PFO.  TCD negative for PFO.  Cardiac cath showed moderate to severe CAD. LDL 85  and initiated atorvastatin 40 mg daily.  HTN with elevated BP and cardiology initiating Entresto and spironolactone prior to discharge.  A1c 5.6.  Residual deficits of dense expressive aphasia and mild right grip weakness.  Evaluated by therapies ? ? ?ROS:   ?14 system review of systems performed and negative with exception of those listed in HPI ? ?PMH:  ?Past Medical History:  ?Diagnosis Date  ? Asthma due to seasonal allergies   ? ? ?PSH:  ?Past Surgical History:  ?Procedure Laterality Date  ? BUBBLE STUDY  01/25/2021  ? Procedure: BUBBLE STUDY;  Surgeon: Jodelle Red, MD;  Location: [facility] ENDOSCOPY;  Service: Cardiovascular;;  ? RIGHT HEART CATH AND CORONARY ANGIOGRAPHY N/A 01/27/2021  ? Procedure: RIGHT HEART CATH AND CORONARY ANGIOGRAPHY;  Surgeon: Lyn Records, MD;  Location: [facility] INVASIVE CV LAB;  Service: Cardiovascular;  Laterality: N/A;  ? TEE WITHOUT CARDIOVERSION N/A 01/25/2021  ? Procedure: TRANSESOPHAGEAL ECHOCARDIOGRAM (TEE);  Surgeon: Jodelle Red, MD;  Location: [facility] ENDOSCOPY;  Service: Cardiovascular;  Laterality: N/A;  ? ? ?Social History:  ?Social History  ? ?Socioeconomic History  ? Marital status: Divorced  ?  Spouse name: Not on file  ? Number of children: 0  ? Years of education: Not on file  ? Highest education level: Some college, no degree  ?Occupational History  ? Not on file  ?Tobacco Use  ? Smoking status: Never  ? Smokeless tobacco: Never  ?Vaping Use  ? Vaping Use: Never used  ?Substance and Sexual Activity  ? Alcohol use: Yes  ?  Alcohol/week: 6.0 standard drinks  ?  Types: 6 Cans of beer per week  ? Drug use: Never  ? Sexual activity: Not on file  ?Other Topics Concern  ? Not on file  ?Social History Narrative  ? Not on file  ? ?Social Determinants of Health  ? ?Financial Resource Strain: Low Risk   ? Difficulty of Paying Living Expenses: Not very hard  ?Food Insecurity: No Food Insecurity  ? Worried About Programme researcher, broadcasting/film/video in the Last Year: Never true  ? Ran Out of  Food in the Last Year: Never true  ?Transportation Needs: No Transportation Needs  ? Lack of Transportation (Medical): No  ? Lack of Transportation (Non-Medical): No  ?Physical Activity: Not on file  ?Stress: Not on file  ?Social Connections: Not on file  ?Intimate Partner Violence: Not on file  ? ? ?Family History: History reviewed. No pertinent family history. ? ?Medications:   ?Current Outpatient Medications on File Prior to Visit  ?Medication Sig Dispense Refill  ? apixaban (ELIQUIS) 5 MG TABS tablet Take 5 mg by mouth 2 (two) times daily.    ? aspirin 81 MG EC tablet Take 1 tablet (81 mg total) by mouth daily. Swallow whole. 30 tablet 0  ? atorvastatin (LIPITOR) 80 MG tablet Take 1 tablet (80 mg total) by mouth daily. 90 tablet 3  ? carvedilol (COREG) 25 MG tablet Take 1 tablet (25 mg total) by mouth 2 (two) times daily with a meal. 180 tablet 3  ? dapagliflozin propanediol ([name]) 10 MG TABS tablet Take 1 tablet (10 mg total) by mouth daily before breakfast. 30 tablet  0  ? ezetimibe (ZETIA) 10 MG tablet Take 1 tablet (10 mg total) by mouth daily. 90 tablet 3  ? fexofenadine (ALLEGRA) 180 MG tablet Take 180 mg by mouth as needed for allergies or rhinitis.    ? FLUoxetine (PROZAC) 20 MG capsule Take 1 capsule (20 mg total) by mouth daily. 90 capsule 2  ? isosorbide-hydrALAZINE (BIDIL) 20-37.5 MG tablet Take 2 tablets by mouth 3 (three) times daily. 540 tablet 3  ? sacubitril-valsartan (ENTRESTO) 97-103 MG Take 1 tablet by mouth 2 (two) times daily. 180 tablet 2  ? spironolactone (ALDACTONE) 50 MG tablet Take 1 tablet (50 mg total) by mouth daily. 30 tablet 8  ? ?No current facility-administered medications on file prior to visit.  ? ? ?Allergies:   ?Allergies  ?Allergen Reactions  ? Chocolate   ? Coconut (Cocos Nucifera) Allergy Skin Test   ? ? ? ? ?OBJECTIVE: ? ?Physical Exam ? ?Vitals:  ? 01/05/22 0801 01/05/22 6283  ?BP: (!) 170/88 (!) 160/90  ?Pulse: 71   ?Weight: 125 lb (56.7 kg)   ?Height: 5\' 5"  (1.651  m)   ? ?Body mass index is 20.8 kg/m? ?No results found. ? ? ?General: Frail very pleasant middle-aged African-American male, seated, in no evident distress ?Head: head normocephalic and atraumatic.

## 2022-01-05 ENCOUNTER — Ambulatory Visit (INDEPENDENT_AMBULATORY_CARE_PROVIDER_SITE_OTHER): Payer: Self-pay | Admitting: Adult Health

## 2022-01-05 ENCOUNTER — Encounter: Payer: Self-pay | Admitting: Adult Health

## 2022-01-05 VITALS — BP 160/90 | HR 71 | Ht 65.0 in | Wt 125.0 lb

## 2022-01-05 DIAGNOSIS — I63412 Cerebral infarction due to embolism of left middle cerebral artery: Secondary | ICD-10-CM

## 2022-01-05 NOTE — Patient Instructions (Signed)
Continue aspirin 81 mg daily and Eliquis (apixaban) daily  and atorvastatin and Zetia for secondary stroke prevention ? ?Continue to follow up with PCP regarding cholesterol and blood pressure management  ?Maintain strict control of hypertension with blood pressure goal below 130/90 and cholesterol with LDL cholesterol (bad cholesterol) goal below 70 mg/dL.  ? ?Signs of a Stroke? Follow the BEFAST method:  ?Balance Watch for a sudden loss of balance, trouble with coordination or vertigo ?Eyes Is there a sudden loss of vision in one or both eyes? Or double vision?  ?Face: Ask the person to smile. Does one side of the face droop or is it numb?  ?Arms: Ask the person to raise both arms. Does one arm drift downward? Is there weakness or numbness of a leg? ?Speech: Ask the person to repeat a simple phrase. Does the speech sound slurred/strange? Is the person confused ? ?Time: If you observe any of these signs, call 911. ? ? ? ? ? ? ? ? ?Thank you for coming to see [place] at [facility]. I hope we have been able to provide you high quality care today. ? ?You may receive a patient satisfaction survey over the next few weeks. We would appreciate your feedback and comments so that we may continue to improve ourselves and the health of our patients. ? ?

## 2022-01-06 ENCOUNTER — Ambulatory Visit (INDEPENDENT_AMBULATORY_CARE_PROVIDER_SITE_OTHER): Payer: Self-pay | Admitting: Primary Care

## 2022-01-06 ENCOUNTER — Other Ambulatory Visit: Payer: Self-pay

## 2022-01-06 ENCOUNTER — Encounter (INDEPENDENT_AMBULATORY_CARE_PROVIDER_SITE_OTHER): Payer: Self-pay | Admitting: Primary Care

## 2022-01-06 VITALS — BP 129/72 | HR 75 | Temp 98.2°F | Ht 65.0 in | Wt 124.0 lb

## 2022-01-06 DIAGNOSIS — R7301 Impaired fasting glucose: Secondary | ICD-10-CM

## 2022-01-06 DIAGNOSIS — L732 Hidradenitis suppurativa: Secondary | ICD-10-CM

## 2022-01-06 DIAGNOSIS — L701 Acne conglobata: Secondary | ICD-10-CM

## 2022-01-06 DIAGNOSIS — L309 Dermatitis, unspecified: Secondary | ICD-10-CM

## 2022-01-06 DIAGNOSIS — L03811 Cellulitis of head [any part, except face]: Secondary | ICD-10-CM

## 2022-01-06 LAB — POCT GLYCOSYLATED HEMOGLOBIN (HGB A1C): Hemoglobin A1C: 5.5 % (ref 4.0–5.6)

## 2022-01-06 NOTE — Progress Notes (Signed)
? ?   [facility] ? ? ? ? ? ? ? ?Subjective:  ?  ? Todd Vega is a 61 y.o. male who presents for evaluation of a rash involving the face and nap of the back of his head . Rash started 2 days ago. Lesions are black and thick, and raised in texture. Rash has not changed over time. Rash causes no discomfort. Associated symptoms: none. Patient denies: fever, headache, and irritability. Patient has not had contacts with similar rash. Patient has not had new exposures (soaps, lotions, laundry detergents, foods,  plants, insects or animals). ?Medications changed added Eloquis. Patient has, No chest pain, No abdominal pain - No Nausea, No new weakness tingling or numbness, No Cough - shortness of breath . Headaches started around the same time as the medication started - does not last long- "comes and goes" Bayer ASA he takes 2 a day if he has a headache and this resolves the headache.  ?The following portions of the patient's history were reviewed and updated as appropriate: allergies, current medications, past family history, past medical history, past social history, and past surgical history. ? ?Review of Systems ?Pertinent items noted in HPI and remainder of comprehensive ROS otherwise negative.  ?  ?Objective:  ? ? BP 129/72 (BP Location: Right Arm, Patient Position: Sitting, Cuff Size: Normal)   Pulse 75   Temp 98.2 ?F (36.8 ?C) (Oral)   Ht 5\' 5"  (1.651 m)   Wt 124 lb (56.2 kg)   SpO2 99%   BMI 20.63 kg/m?  ?   ?   ? General: No apparent distress. ?Eyes: Extraocular eye movements intact, pupils equal and round. ?Neck: Supple, trachea midline. ?Thyroid: No enlargement, mobile without fixation, no tenderness. ?Cardiovascular: Regular rhythm and rate, no murmur, normal radial pulses. ?Respiratory: Normal respiratory effort, clear to auscultation. ?Gastrointestinal: Normal pitch active bowel sounds, nontender abdomen without distention or appreciable hepatomegaly. ?Musculoskeletal: Normal muscle  tone, no tenderness on palpation of tibia, no excessive thoracic kyphosis. ?Skin:  cysts noted on FACE AND NAP OF NECK. Hidradenitis  ? ?Mental status: Alert, conversant, speech clear, thought logical, appropriate mood and affect, no hallucinations or delusions evident. ?Hematologic/lymphatic: No cervical adenopathy, no visible ecchymoses.  ? ?Assessment:  ?Todd Vega was seen today for possible shingles outbreak . ? ?Diagnoses and all orders for this visit: ? ?Hidradenitis on nap of neck with scalp ?Treatment options given on AVS.  ? ?Eczema, unspecified type ?Encouraged to use emollient lotions ? ?Elevated fasting glucose ?-     HgB A1c 5.5 per ADA guidelines he is not prediabetic or diabetic ? ?  Medications: none.  ? ? ?This note has been created with Todd Hua. Any transcriptional errors are unintentional.  ? ?Education officer, environmental, NP ?01/06/2022, 1:47 PM  ?

## 2022-01-06 NOTE — Patient Instructions (Addendum)
Eczema ?Eczema refers to a group of skin conditions that cause skin to become rough and inflamed. Each type of eczema has different triggers, symptoms, and treatments. Eczema of any type is usually itchy. Symptoms range from mild to severe. ?Eczema is not spread from person to person (is not contagious). It can appear on different parts of the body at different times. One person's eczema may look different from another person's eczema. ?What are the causes? ?The exact cause of this condition is not known. However, exposure to certain environmental factors, irritants, and allergens can make the condition worse. ?What are the signs or symptoms? ?Symptoms of this condition depend on the type of eczema you have. The types include: ?Contact dermatitis. There are two kinds: ?Irritant contact dermatitis. This happens when something irritates the skin and causes a rash. ?Allergic contact dermatitis. This happens when your skin comes in contact with something you are allergic to (allergens). This can include poison ivy, chemicals, or medicines that were applied to your skin. ?Atopic dermatitis. This is a long-term (chronic) skin disease that keeps coming back (recurring). It is the most common type of eczema. Usual symptoms are a red rash and itchy, dry, scaly skin. It usually starts showing signs in infancy and can last through adulthood. ?Dyshidrotic eczema. This is a form of eczema on the hands and feet. It shows up as very itchy, fluid-filled blisters. It can affect people of any age but is more common before age 75. ?Hand eczema. This causes very itchy areas of skin on the palms and sides of the hands and fingers. This type of eczema is common in industrial jobs where you may be exposed to different types of irritants. ?Lichen simplex chronicus. This type of eczema occurs when a person constantly scratches one area of the body. Repeated scratching of the area leads to thickened skin (lichenification). This condition can  accompany other types of eczema. It is more common in adults but may also be seen in children. ?Nummular eczema. This is a common type of eczema that most often affects the lower legs and the backs of the hands. It typically causes an itchy, red, circular, crusty lesion (plaque). Scratching may become a habit and can cause bleeding. Nummular eczema occurs most often in middle-aged or older people. ?Seborrheic dermatitis. This is a common skin disease that mainly affects the scalp. It may also affect other oily areas of the body, such as the face, sides of the nose, eyebrows, ears, eyelids, and chest. It is marked by small scaling and redness of the skin (erythema). This can affect people of all ages. In infants, this condition is called cradle cap. ?Stasis dermatitis. This is a common skin disease that can cause itching, scaling, and hyperpigmentation, usually on the legs and feet. It occurs most often in people who have a condition that prevents blood from being pumped through the veins in the legs (chronic venous insufficiency). Stasis dermatitis is a chronic condition that needs long-term management. ?How is this diagnosed? ?This condition may be diagnosed based on: ?A physical exam of your skin. ?Your medical history. ?Skin patch tests. These tests involve using patches that contain possible allergens and placing them on your back. Your health care provider will check in a few days to see if an allergic reaction occurred. ?How is this treated? ?Treatment for eczema is based on the type of eczema you have. You may be given hydrocortisone steroid medicine or antihistamines. These can relieve itching quickly and help reduce inflammation.  These may be prescribed or purchased over the counter, depending on the strength that is needed. ?Follow these instructions at home: ?Take or apply over-the-counter and prescription medicines only as told by your health care provider. ?Use creams or ointments to moisturize your  skin. Do not use lotions. ?Learn what triggers or irritates your symptoms so you can avoid these things. ?Treat symptom flare-ups quickly. ?Do not scratch your skin. This can make your rash worse. ?Keep all follow-up visits. This is important. ?Where to find more information ?American Academy of Dermatology: MarketingSheets.siaad.org ?National Eczema Association: nationaleczema.org ?The Society for Pediatric Dermatology: pedsderm.net ?Contact a health care provider if: ?You have severe itching, even with treatment. ?You scratch your skin regularly until it bleeds. ?Your rash looks different than usual. ?Your skin is painful, swollen, or more red than usual. ?You have a fever. ?Summary ?Eczema refers to a group of skin conditions that cause skin to become rough and inflamed. Each type has different triggers. ?Eczema of any type causes itching that may range from mild to severe. ?Treatment varies based on the type of eczema you have. Hydrocortisone steroid medicine or antihistamines can help with itching and inflammation. ?Protecting your skin is the best way to prevent eczema. Use creams or ointments to moisturize your skin. Avoid triggers and irritants. Treat flare-ups quickly. ?This information is not intended to replace advice given to you by your health care provider. Make sure you discuss any questions you have with your health care provider. ?Document Revised: 07/13/2020 Document Reviewed: 07/13/2020 ?Elsevier Patient Education ? 2022 Elsevier Inc. ?Hidradenitis Suppurativa ?Hidradenitis suppurativa is a long-term (chronic) skin disease. It is similar to a severe form of acne, but it affects areas of the body where acne would be unusual, especially areas of the body where skin rubs against skin and becomes moist. These include: ?Underarms. ?Groin. ?Genital area. ?Buttocks. ?Upper thighs. ?Breasts. ?Hidradenitis suppurativa may start out as small lumps or pimples caused by blocked sweat glands or hair follicles. Pimples may  develop into deep sores that break open (rupture) and drain pus. Over time, affected areas of skin may thicken and become scarred. This condition is rare and does not spread from person to person (non-contagious). ?What are the causes? ?The exact cause of this condition is not known. It may be related to: ?Male and male hormones. ?An overactive disease-fighting system (immune system). The immune system may over-react to blocked hair follicles or sweat glands and cause swelling and pus-filled sores. ?What increases the risk? ?You are more likely to develop this condition if you: ?Are male. ?Are 8011-61 years old. ?Have a family history of hidradenitis suppurativa. ?Have a personal history of acne. ?Are overweight. ?Smoke. ?Take the medicine lithium. ?What are the signs or symptoms? ?The first symptoms are usually painful bumps in the skin, similar to pimples. The condition may get worse over time (progress), or it may only cause mild symptoms. If the disease progresses, symptoms may include: ?Skin bumps getting bigger and growing deeper into the skin. ?Bumps rupturing and draining pus. ?Itchy, infected skin. ?Skin getting thicker and scarred. ?Tunnels under the skin (fistulas) where pus drains from a bump. ?Pain during daily activities, such as pain during walking if your groin area is affected. ?Emotional problems, such as stress or depression. This condition may affect your appearance and your ability or willingness to wear certain clothes or do certain activities. ?How is this diagnosed? ?This condition is diagnosed by a health care provider who specializes in skin diseases (dermatologist). You  may be diagnosed based on: ?Your symptoms and medical history. ?A physical exam. ?Testing a pus sample for infection. ?Blood tests. ?How is this treated? ?Your treatment will depend on how severe your symptoms are. The same treatment will not work for everybody with this condition. You may need to try several treatments  to find what works best for you. Treatment may include: ?Cleaning and bandaging (dressing) your wounds as needed. ?Lifestyle changes, such as new skin care routines. ?Taking medicines, such as: ?Antibiotics.

## 2022-01-07 ENCOUNTER — Ambulatory Visit (INDEPENDENT_AMBULATORY_CARE_PROVIDER_SITE_OTHER): Payer: Self-pay | Admitting: *Deleted

## 2022-01-07 NOTE — Telephone Encounter (Signed)
?  Chief Complaint: bumps on right side of neck are getting worse.  Spreading and it hurts to move his neck. ?Symptoms: above ?Frequency: Not asked ?Pertinent Negatives: Patient denies soreness, itching or having blisters ?Disposition: [] ED /[] Urgent Care (no appt availability in office) / [] Appointment(In office/virtual)/ []  [facility]/ [] Home Care/ [] Refused Recommended Disposition /[] [facility]/ [x]  Follow-up with PCP ?Additional Notes: Note sent to , NP letting her know the bumps are getting worse, spreading since pt seen yesterday for same issue.  Pt agreeable to someone calling him back with her answer.  ?

## 2022-01-07 NOTE — Telephone Encounter (Signed)
Routed to PCP 

## 2022-01-07 NOTE — Telephone Encounter (Signed)
Reason for Disposition ? [1] MODERATE neck pain (e.g., interferes with normal activities) AND [2] present > 3 days ? ?Answer Assessment - Initial Assessment Questions ?1. ONSET: "When did the pain begin?"  ?    I got bumps on my neck.   I saw Sharyn Lull yesterday for this.   It's getting worse.     Thought it was Shingles but she said it wasn't.   It's allergies. ?It only hurts if I move my neck.   The bumps on my neck are getting worse. ?2. LOCATION: "Where does it hurt?"  ?    Right side of neck. ?3. PATTERN "Does the pain come and go, or has it been constant since it started?"  ?    *No Answer* ?4. SEVERITY: "How bad is the pain?"  (Scale 1-10; or mild, moderate, severe) ?  - NO PAIN (0): no pain or only slight stiffness  ?  - MILD (1-3): doesn't interfere with normal activities  ?  - MODERATE (4-7): interferes with normal activities or awakens from sleep  ?  - SEVERE (8-10):  excruciating pain, unable to do any normal activities  ?    *No Answer* ?5. RADIATION: "Does the pain go anywhere else, shoot into your arms?" ?    *No Answer* ?6. CORD SYMPTOMS: "Any weakness or numbness of the arms or legs?" ?    *No Answer* ?7. CAUSE: "What do you think is causing the neck pain?" ?    *No Answer* ?8. NECK OVERUSE: "Any recent activities that involved turning or twisting the neck?" ?    *No Answer* ?9. OTHER SYMPTOMS: "Do you have any other symptoms?" (e.g., headache, fever, chest pain, difficulty breathing, neck swelling) ?    *No Answer* ?10. PREGNANCY: "Is there any chance you are pregnant?" "When was your last menstrual period?" ?      *No Answer* ? ?Protocols used: Neck Pain or Stiffness-A-AH ? ?

## 2022-01-10 ENCOUNTER — Other Ambulatory Visit (INDEPENDENT_AMBULATORY_CARE_PROVIDER_SITE_OTHER): Payer: Self-pay | Admitting: Primary Care

## 2022-01-10 ENCOUNTER — Ambulatory Visit
Admission: EM | Admit: 2022-01-10 | Discharge: 2022-01-10 | Disposition: A | Discharge status: Home / Self Care | Payer: Self-pay | Attending: Physician Assistant | Admitting: Physician Assistant

## 2022-01-10 ENCOUNTER — Other Ambulatory Visit: Payer: Self-pay

## 2022-01-10 DIAGNOSIS — L309 Dermatitis, unspecified: Secondary | ICD-10-CM

## 2022-01-10 DIAGNOSIS — L701 Acne conglobata: Secondary | ICD-10-CM

## 2022-01-10 DIAGNOSIS — L03811 Cellulitis of head [any part, except face]: Secondary | ICD-10-CM

## 2022-01-10 DIAGNOSIS — L732 Hidradenitis suppurativa: Secondary | ICD-10-CM

## 2022-01-10 MED ORDER — DOXYCYCLINE HYCLATE 100 MG PO CAPS: 100.0000 mg | ORAL_CAPSULE | Freq: Two times a day (BID) | ORAL | 0 refills | Status: DC

## 2022-01-10 NOTE — ED Triage Notes (Signed)
6 days ago, Pt reports a shingles outbreak on his face. He was seen by his PCP, but Pt reports that he didn't receive any tx. Sxs have significantly worsened. Has been taking tylenol with some relief. ?

## 2022-01-10 NOTE — ED Provider Notes (Signed)
?[facility] ? ? ? ?CSN: [id number] ?Arrival date & time: 01/10/22  [id number] ? ? ?  ? ?History   ?Chief Complaint ?Chief Complaint  ?Patient presents with  ? shingles outbreak  ? ? ?HPI ?Janae SauceDavid Winders is a 61 y.o. male.  ? ?Patient here today for evaluation of rash to the right side of his face and neck that started 6 days ago. He was evaluated by PCP recently but states he did not receive any treatment. He reports that he has significantly worsened since that time. He has been taking tylenol with some relief. He does not report fever.  ? ?The history is provided by the patient.  ? ?Past Medical History:  ?Diagnosis Date  ? Asthma due to seasonal allergies   ? ? ?Patient Active Problem List  ? Diagnosis Date Noted  ? Chronic combined systolic and diastolic CHF (congestive heart failure) (HCC) 12/30/2021  ? CAD (coronary artery disease) 07/09/2021  ? Depression 05/25/2021  ? Generalized anxiety disorder 05/25/2021  ? Combined systolic and diastolic heart failure (HCC) 01/26/2021  ? Hyperlipidemia 01/26/2021  ? Hyponatremia 01/26/2021  ? Alcohol abuse 01/26/2021  ? Hypomagnesemia 01/26/2021  ? Acute CVA (cerebrovascular accident) (HCC) 01/23/2021  ? HTN (hypertension) 01/23/2021  ? CVA (cerebral vascular accident) (HCC) 01/23/2021  ? ? ?Past Surgical History:  ?Procedure Laterality Date  ? BUBBLE STUDY  01/25/2021  ? Procedure: BUBBLE STUDY;  Surgeon: Jodelle Redhristopher, Bridgette, MD;  [facility];  Service: Cardiovascular;;  ? RIGHT HEART CATH AND CORONARY ANGIOGRAPHY N/A 01/27/2021  ? Procedure: RIGHT HEART CATH AND CORONARY ANGIOGRAPHY;  Surgeon: Lyn RecordsSmith, Henry W, MD;  [facility];  Service: Cardiovascular;  Laterality: N/A;  ? TEE WITHOUT CARDIOVERSION N/A 01/25/2021  ? Procedure: TRANSESOPHAGEAL ECHOCARDIOGRAM (TEE);  Surgeon: Jodelle Redhristopher, Bridgette, MD;  [facility];  Service: Cardiovascular;  Laterality: N/A;  ? ? ? ? ? ?Home Medications   ? ?Prior to Admission medications    ?Medication Sig Start Date End Date Taking? Authorizing Provider  ?doxycycline (VIBRAMYCIN) 100 MG capsule Take 1 capsule (100 mg total) by mouth 2 (two) times daily. 01/10/22  Yes Tomi BambergerMyers, Kendahl Bumgardner F, PA-C  ?apixaban (ELIQUIS) 5 MG TABS tablet Take 5 mg by mouth 2 (two) times daily.    [provider]  ?aspirin 81 MG EC tablet Take 1 tablet (81 mg total) by mouth daily. Swallow whole. 01/28/21   Danford, Earl Liteshristopher P, MD  ?atorvastatin (LIPITOR) 80 MG tablet Take 1 tablet (80 mg total) by mouth daily. 04/01/21   Nahser, Deloris PingPhilip J, MD  ?carvedilol (COREG) 25 MG tablet Take 1 tablet (25 mg total) by mouth 2 (two) times daily with a meal. 04/08/21   Nahser, Deloris PingPhilip J, MD  ?dapagliflozin propanediol ([name]) 10 MG TABS tablet Take 1 tablet (10 mg total) by mouth daily before breakfast. 02/19/21   Nahser, Deloris PingPhilip J, MD  ?ezetimibe (ZETIA) 10 MG tablet Take 1 tablet (10 mg total) by mouth daily. 04/30/21   Nahser, Deloris PingPhilip J, MD  ?fexofenadine (ALLEGRA) 180 MG tablet Take 180 mg by mouth as needed for allergies or rhinitis.    [provider]  ?FLUoxetine (PROZAC) 20 MG capsule Take 1 capsule (20 mg total) by mouth daily. 08/25/21   Grayce SessionsEdwards, Michelle P, NP  ?isosorbide-hydrALAZINE (BIDIL) 20-37.5 MG tablet Take 2 tablets by mouth 3 (three) times daily. 12/30/21   Nahser, Deloris PingPhilip J, MD  ?sacubitril-valsartan (ENTRESTO) 97-103 MG Take 1 tablet by mouth 2 (two) times daily. 10/12/21   Nahser,  Deloris Ping, MD  ?spironolactone (ALDACTONE) 50 MG tablet Take 1 tablet (50 mg total) by mouth daily. 11/02/21   Nahser, Deloris Ping, MD  ? ? ?Family History ?History reviewed. No pertinent family history. ? ?Social History ?Social History  ? ?Tobacco Use  ? Smoking status: Never  ? Smokeless tobacco: Never  ?Vaping Use  ? Vaping Use: Never used  ?Substance Use Topics  ? Alcohol use: Yes  ?  Alcohol/week: 6.0 standard drinks  ?  Types: 6 Cans of beer per week  ? Drug use: Never  ? ? ? ?Allergies   ?Chocolate and Coconut (cocos nucifera)  allergy skin test ? ? ?Review of Systems ?Review of Systems  ?Constitutional:  Negative for chills and fever.  ?HENT:  Positive for facial swelling. Negative for drooling.   ?Eyes:  Negative for discharge and redness.  ?Respiratory:  Negative for shortness of breath.   ?Skin:  Positive for rash.  ? ? ?Physical Exam ?Triage Vital Signs ?ED Triage Vitals [01/10/22 0918]  ?Enc Vitals Group  ?   BP   ?   Pulse   ?   Resp   ?   Temp   ?   Temp src   ?   SpO2   ?   Weight   ?   Height   ?   Head Circumference   ?   Peak Flow   ?   Pain Score 9  ?   Pain Loc   ?   Pain Edu?   ?   Excl. in GC?   ? ?No data found. ? ?Updated Vital Signs ?BP (!) 192/96 (BP Location: Left Arm)   Pulse 73   Temp 99 ?F (37.2 ?C) (Oral)   Resp 18   SpO2 95%  ?   ? ?Physical Exam ?Vitals and nursing note reviewed.  ?Constitutional:   ?   General: He is not in acute distress. ?   Appearance: Normal appearance. He is not ill-appearing.  ?HENT:  ?   Head: Normocephalic and atraumatic.  ?Eyes:  ?   Conjunctiva/sclera: Conjunctivae normal.  ?Cardiovascular:  ?   Rate and Rhythm: Normal rate.  ?Pulmonary:  ?   Effort: Pulmonary effort is normal.  ?Skin: ?   Comments: Diffuse scaling, peeling, desquamation of skin to right side of face, ear, neck with associated swelling.   ?Neurological:  ?   Mental Status: He is alert.  ?Psychiatric:     ?   Mood and Affect: Mood normal.     ?   Behavior: Behavior normal.     ?   Thought Content: Thought content normal.  ? ? ? ?UC Treatments / Results  ?Labs ?(all labs ordered are listed, but only abnormal results are displayed) ?Labs Reviewed - No data to display ? ?EKG ? ? ?Radiology ?No results found. ? ?Procedures ?Procedures (including critical care time) ? ?Medications Ordered in UC ?Medications - No data to display ? ?Initial Impression / Assessment and Plan / UC Course  ?I have reviewed the triage vital signs and the nursing notes. ? ?Pertinent labs & imaging results that were available during my care of  the patient were reviewed by me and considered in my medical decision making (see chart for details). ? ?  ?Strongly recommended follow up with PCP today- I suspect he will at minimum need referral to dermatology. Antibiotic prescribed to cover secondary infection.  ? ?Final Clinical Impressions(s) / UC Diagnoses  ? 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 ?Discharge Instructions   ?None ?  ? ?ED Prescriptions   ? ? Medication Sig Dispense Auth. Provider  ? doxycycline (VIBRAMYCIN) 100 MG capsule Take 1 capsule (100 mg total) by mouth 2 (two) times daily. 20 capsule Tomi Bamberger, PA-C  ? ?  ? ?PDMP not reviewed this encounter. ?  ?Tomi Bamberger, PA-C ?01/10/22 1000 ? ?

## 2022-01-10 NOTE — Telephone Encounter (Signed)
Refer dermatology

## 2022-01-10 NOTE — Telephone Encounter (Signed)
Patients pain is not muscle skeletal. Please see encounter note from recent visit and advise.  ?

## 2022-01-12 ENCOUNTER — Ambulatory Visit (INDEPENDENT_AMBULATORY_CARE_PROVIDER_SITE_OTHER): Payer: Self-pay | Admitting: Primary Care

## 2022-01-12 ENCOUNTER — Telehealth (INDEPENDENT_AMBULATORY_CARE_PROVIDER_SITE_OTHER): Payer: Self-pay | Admitting: Primary Care

## 2022-01-12 ENCOUNTER — Ambulatory Visit: Payer: Self-pay | Admitting: Dermatology

## 2022-01-12 ENCOUNTER — Ambulatory Visit (INDEPENDENT_AMBULATORY_CARE_PROVIDER_SITE_OTHER): Payer: Self-pay | Admitting: *Deleted

## 2022-01-12 NOTE — Telephone Encounter (Signed)
Reason for Disposition ? [1] Looks infected (spreading redness, pus) AND [2] no fever ?   Eczema getting worse on face. ? ?Answer Assessment - Initial Assessment Questions ?1. APPEARANCE of RASH: "Describe the rash."  ?    I have Eczema that is getting worse on my face and neck.   Both sides of my face are swollen.   Started last Tuesday ? ?Yesterday I went to urgent care close to my house and they gave me doxycycline 100 mg.    Started it.   ?2. LOCATION: "Where is the rash located?"  ?    It's around my neck and on right check. ?3. NUMBER: "How many spots are there?"  ?     ?4. SIZE: "How big are the spots?" (Inches, centimeters or compare to size of a coin)  ?     ?5. ONSET: "When did the rash start?"  ?    Last Tues. ?6. ITCHING: "Does the rash itch?" If Yes, ask: "How bad is the itch?"  (Scale 0-10; or none, mild, moderate, severe) ?    A little itching ?7. PAIN: "Does the rash hurt?" If Yes, ask: "How bad is the pain?"  (Scale 0-10; or none, mild, moderate, severe) ?   - NONE (0): no pain ?   - MILD (1-3): doesn't interfere with normal activities  ?   - MODERATE (4-7): interferes with normal activities or awakens from sleep  ?   - SEVERE (8-10): excruciating pain, unable to do any normal activities ?    No ?8. OTHER SYMPTOMS: "Do you have any other symptoms?" (e.g., fever) ?    No ?9. PREGNANCY: "Is there any chance you are pregnant?" "When was your last menstrual period?" ?    N/A ? ?Protocols used: Rash or Redness - Localized-A-AH ? ?

## 2022-01-12 NOTE — Telephone Encounter (Signed)
Spoke with Todd Vega about not needing this appointment. Informed him that Marcelino Duster NP stated he needs to let the antibiotic work to help with the swelling.   ?

## 2022-01-12 NOTE — Telephone Encounter (Signed)
?  Chief Complaint: Eczema on face getting worse and near one of his eyes.   Also around his neck ?Symptoms: Face is swollen ?Frequency: Since last Tues. ?Pertinent Negatives: Patient denies  ?Disposition: [] ED /[] Urgent Care (no appt availability in office) / [x] Appointment(In office/virtual)/ []  [facility]/ [] Home Care/ [] Refused Recommended Disposition /[] [place] [facility]/ []  Follow-up with PCP ?Additional Notes:   ?

## 2022-01-14 ENCOUNTER — Other Ambulatory Visit: Payer: Self-pay

## 2022-01-17 ENCOUNTER — Other Ambulatory Visit: Payer: Self-pay

## 2022-01-17 ENCOUNTER — Ambulatory Visit (INDEPENDENT_AMBULATORY_CARE_PROVIDER_SITE_OTHER): Payer: Self-pay | Admitting: Primary Care

## 2022-01-17 ENCOUNTER — Encounter (INDEPENDENT_AMBULATORY_CARE_PROVIDER_SITE_OTHER): Payer: Self-pay | Admitting: Primary Care

## 2022-01-17 ENCOUNTER — Ambulatory Visit (INDEPENDENT_AMBULATORY_CARE_PROVIDER_SITE_OTHER): Payer: Self-pay

## 2022-01-17 VITALS — BP 113/71 | HR 78 | Temp 98.1°F | Ht 65.0 in | Wt 119.2 lb

## 2022-01-17 DIAGNOSIS — L732 Hidradenitis suppurativa: Secondary | ICD-10-CM

## 2022-01-17 DIAGNOSIS — L701 Acne conglobata: Secondary | ICD-10-CM

## 2022-01-17 DIAGNOSIS — L03811 Cellulitis of head [any part, except face]: Secondary | ICD-10-CM

## 2022-01-17 MED ORDER — TRIAMCINOLONE ACETONIDE 0.1 % EX CREA
1.0000 "application " | TOPICAL_CREAM | Freq: Two times a day (BID) | CUTANEOUS | 0 refills | Status: DC
  Filled 2022-01-17: qty 45, 23d supply, fill #0

## 2022-01-17 NOTE — Telephone Encounter (Signed)
?  Chief Complaint: rash ?Symptoms: rash on R side of face and neck, itches, burns and painful  ?Frequency: 1 week or more ?Pertinent Negatives: NA ?Disposition: [] ED /[] Urgent Care (no appt availability in office) / [x] Appointment(In office/virtual)/ []  [facility]/ [] Home Care/ [] Refused Recommended Disposition /[] [place] [facility]/ []  Follow-up with PCP ?Additional Notes: Pt has been on abx since 01/10/22 being seen at [facility] and states rash is no better. He said it is very painful as well.  ? ?Summary: skin irritation / rx request  ? The patient has experienced concerns related to eczema previously and shares that the skin irritation has returned  ? ?The patient would like to be prescribed something for their irritation as well as something to relieve the pain that the itching has caused  ? ?The patient shares that the irritation on the right side of their face is particularly uncomfortable  ? ?Please contact further when possible   ?  ? ?Reason for Disposition ? [1] Localized rash is very painful AND [2] no fever ? ?Answer Assessment - Initial Assessment Questions ?2. LOCATION: "Where is the rash located?"  ?    R face and neck  ?5. ONSET: "When did the rash start?"  ?    Last Tuesday  ?6. ITCHING: "Does the rash itch?" If Yes, ask: "How bad is the itch?"  (Scale 0-10; or none, mild, moderate, severe) ?    Yes  ?7. PAIN: "Does the rash hurt?" If Yes, ask: "How bad is the pain?"  (Scale 0-10; or none, mild, moderate, severe) ?   - NONE (0): no pain ?   - MILD (1-3): doesn't interfere with normal activities  ?   - MODERATE (4-7): interferes with normal activities or awakens from sleep  ?   - SEVERE (8-10): excruciating pain, unable to do any normal activities ?    10 ?8. OTHER SYMPTOMS: "Do you have any other symptoms?" (e.g., fever) ?    Burning and itching ? ?Protocols used: Rash or Redness - Localized-A-AH ? ?

## 2022-01-18 ENCOUNTER — Ambulatory Visit (INDEPENDENT_AMBULATORY_CARE_PROVIDER_SITE_OTHER): Payer: Self-pay | Admitting: *Deleted

## 2022-01-18 NOTE — Telephone Encounter (Signed)
My uncle said Todd Passe, NP told him to go to the hospital.   ?Reason for Disposition ? [1] Caller requesting NON-URGENT health information AND [2] PCP's office is the best resource ? ?Answer Assessment - Initial Assessment Questions ?1. REASON FOR CALL or QUESTION: "What is your reason for calling today?" or "How can I best help you?" or "What question do you have that I can help answer?" ?    [name], his niece calling in saying her Uncle (patient) called her to come home from work because Todd Passe, NP told him he needed to go to the hospital.    She is needing clarification.    ? ?There is nothing mentioned in the chart regarding him going to the hospital.  I warm transferred the call into the office to [place] who is checking with Marcelino Duster. ? ?Protocols used: Information Only Call - No Triage-A-AH ? ?

## 2022-01-18 NOTE — Progress Notes (Signed)
?[facility] ? ?Bastion Bolger, is a 61 y.o. male ? ?[id number] ? ?[id number] ? ?DOB - Jan 02, 1961 ? ?Chief Complaint  ?Patient presents with  ? bumps on face and neck   ?  Have worsened. Please see UC note  ?    ? ?Subjective:  ? ?Royale Swamy is a 61 y.o. male here today for a follow up visit rash which is spreading rapidly Rash is pruritis and painful . Patient has No headache, No chest pain, No abdominal pain - No Nausea, No new weakness tingling or numbness, No Cough - shortness of breath. Unable to reach dermatology for a consult yesterday afternoon. Return call today explain vast difference in 2 weeks. He was given abt's doxycyline from Urgent care. (Recent abt's yes)She questioned has he traveled outside the country N0, type of work -Stage manager, any immunocompromise disease - no. Advised to have him to go to [facility] ED there he will be seen and dermatology would be consulted or and other specialties. Called patient and gave him this advise. ? ?No problems updated. ? ?Allergies  ?Allergen Reactions  ? Chocolate   ? Coconut (Cocos Nucifera) Allergy Skin Test   ? ? ?Past Medical History:  ?Diagnosis Date  ? Asthma due to seasonal allergies   ? ? ?Current Outpatient Medications on File Prior to Visit  ?Medication Sig Dispense Refill  ? apixaban (ELIQUIS) 5 MG TABS tablet Take 5 mg by mouth 2 (two) times daily.    ? aspirin 81 MG EC tablet Take 1 tablet (81 mg total) by mouth daily. Swallow whole. 30 tablet 0  ? atorvastatin (LIPITOR) 80 MG tablet Take 1 tablet (80 mg total) by mouth daily. 90 tablet 3  ? carvedilol (COREG) 25 MG tablet Take 1 tablet (25 mg total) by mouth 2 (two) times daily with a meal. 180 tablet 3  ? dapagliflozin propanediol ([name]) 10 MG TABS tablet Take 1 tablet (10 mg total) by mouth daily before breakfast. 30 tablet 0  ? doxycycline (VIBRAMYCIN) 100 MG capsule Take 1 capsule (100 mg total) by mouth 2 (two) times daily. 20 capsule 0  ? ezetimibe (ZETIA) 10 MG tablet  Take 1 tablet (10 mg total) by mouth daily. 90 tablet 3  ? fexofenadine (ALLEGRA) 180 MG tablet Take 180 mg by mouth as needed for allergies or rhinitis.    ? FLUoxetine (PROZAC) 20 MG capsule Take 1 capsule (20 mg total) by mouth daily. 90 capsule 2  ? isosorbide-hydrALAZINE (BIDIL) 20-37.5 MG tablet Take 2 tablets by mouth 3 (three) times daily. 540 tablet 3  ? sacubitril-valsartan (ENTRESTO) 97-103 MG Take 1 tablet by mouth 2 (two) times daily. 180 tablet 2  ? spironolactone (ALDACTONE) 50 MG tablet Take 1 tablet (50 mg total) by mouth daily. 30 tablet 8  ? ?No current facility-administered medications on file prior to visit.  ? ? ?Objective:  ? ?Vitals:  ? 01/17/22 1520  ?BP: 113/71  ?Pulse: 78  ?Temp: 98.1 ?F (36.7 ?C)  ?TempSrc: Oral  ?SpO2: 97%  ?Weight: 119 lb 3.2 oz (54.1 kg)  ?Height: 5\' 5"  (1.651 m)  ? ? ?Exam ?General appearance : Awake, alert, not in any distress. Speech Clear. Not toxic looking ?HEENT: Atraumatic and Normocephalic, pupils equally reactive to light and accomodation ?Neck: Supple, no JVD. No cervical lymphadenopathy.  ?Chest: Good air entry bilaterally, no added sounds  ?CVS: S1 S2 regular, no murmurs.  ?Abdomen: Bowel sounds present, Non tender and not distended with no gaurding, rigidity or rebound. ?  Extremities: B/L Lower Ext shows no edema, both legs are warm to touch ?Neurology: Awake alert, and oriented X 3, CN II-XII intact, Non focal ?Skin: see photo  ? ?Data Review ?Lab Results  ?Component Value Date  ? HGBA1C 5.5 01/06/2022  ? HGBA1C 5.6 01/23/2021  ? ? ?Assessment & Plan  ? ?1. 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 ? ? ? ? ?- triamcinolone cream (KENALOG) 0.1 %; Apply 1 application. topically 2 (two) times daily.  Dispense: 45 g; Refill: 0 ?- Ambulatory referral to Dermatology- previously place but out of town and initial cost unable to be seen ?Advised to go to [facility] ED for evaluation and treatment  ? ? ?Patient have been counseled  extensively about nutrition and exercise. Other issues discussed during this visit include: low cholesterol diet, weight control and daily exercise, foot care, annual eye examinations at Ophthalmology, importance of adherence with medications and regular follow-up. We also discussed long term complications of uncontrolled diabetes and hypertension.  ? ?Return in about 1 week (around 01/24/2022) for rash. ? ?The patient was given clear instructions to go to ER or return to medical center if symptoms don't improve, worsen or new problems develop. The patient verbalized understanding. The patient was told to call to get lab results if they haven't heard anything in the next week.  ? ?This note has been created with Education officer, environmental. Any transcriptional errors are unintentional.  ? ?[name], NP ? ?

## 2022-01-26 ENCOUNTER — Other Ambulatory Visit: Payer: Self-pay

## 2022-01-26 ENCOUNTER — Encounter (INDEPENDENT_AMBULATORY_CARE_PROVIDER_SITE_OTHER): Payer: Self-pay | Admitting: Primary Care

## 2022-01-26 ENCOUNTER — Ambulatory Visit (INDEPENDENT_AMBULATORY_CARE_PROVIDER_SITE_OTHER): Payer: Self-pay | Admitting: Primary Care

## 2022-01-26 VITALS — BP 130/75 | HR 87 | Temp 98.0°F | Ht 65.0 in | Wt 123.6 lb

## 2022-01-26 DIAGNOSIS — B000 Eczema herpeticum: Secondary | ICD-10-CM

## 2022-01-26 MED ORDER — GABAPENTIN 100 MG PO CAPS
100.0000 mg | ORAL_CAPSULE | Freq: Three times a day (TID) | ORAL | 3 refills | Status: DC
  Filled 2022-01-26: qty 90, 30d supply, fill #0

## 2022-01-26 NOTE — Progress Notes (Signed)
?[facility] ? ?Todd Vega, is a 61 y.o. male ? ?[id number] ? ?[id number] ? ?DOB - [birth year] ? ?Chief Complaint  ?Patient presents with  ? Follow-up  ?  rash  ?    ? ?Subjective:  ? ?Todd Vega is a 61 y.o. male here today for a follow up on rash and dx at [facility] as Patient has No headache, No chest pain, No abdominal pain - No Nausea, No new weakness tingling or numbness, No Cough - shortness of breath. He is complaining of pain. ? ?No problems updated. ? ?Allergies  ?Allergen Reactions  ? Chocolate   ? Coconut (Cocos Nucifera) Allergy Skin Test   ? ? ?Past Medical History:  ?Diagnosis Date  ? Asthma due to seasonal allergies   ? ? ?Current Outpatient Medications on File Prior to Visit  ?Medication Sig Dispense Refill  ? apixaban (ELIQUIS) 5 MG TABS tablet Take 5 mg by mouth 2 (two) times daily.    ? aspirin 81 MG EC tablet Take 1 tablet (81 mg total) by mouth daily. Swallow whole. 30 tablet 0  ? atorvastatin (LIPITOR) 80 MG tablet Take 1 tablet (80 mg total) by mouth daily. 90 tablet 3  ? carvedilol (COREG) 25 MG tablet Take 1 tablet (25 mg total) by mouth 2 (two) times daily with a meal. 180 tablet 3  ? dapagliflozin propanediol ([name]) 10 MG TABS tablet Take 1 tablet (10 mg total) by mouth daily before breakfast. 30 tablet 0  ? ezetimibe (ZETIA) 10 MG tablet Take 1 tablet (10 mg total) by mouth daily. 90 tablet 3  ? fexofenadine (ALLEGRA) 180 MG tablet Take 180 mg by mouth as needed for allergies or rhinitis.    ? FLUoxetine (PROZAC) 20 MG capsule Take 1 capsule (20 mg total) by mouth daily. 90 capsule 2  ? isosorbide-hydrALAZINE (BIDIL) 20-37.5 MG tablet Take 2 tablets by mouth 3 (three) times daily. 540 tablet 3  ? sacubitril-valsartan (ENTRESTO) 97-103 MG Take 1 tablet by mouth 2 (two) times daily. 180 tablet 2  ? spironolactone (ALDACTONE) 50 MG tablet Take 1 tablet (50 mg total) by mouth daily. 30 tablet 8  ? triamcinolone cream (KENALOG) 0.1 % Apply 1 application.  topically 2 (two) times daily. 45 g 0  ? ?No current facility-administered medications on file prior to visit.  ? ? ?Objective:  ? ?Vitals:  ? 01/26/22 1458  ?BP: 130/75  ?Pulse: 87  ?Temp: 98 ?F (36.7 ?C)  ?TempSrc: Oral  ?SpO2: 97%  ?Weight: 123 lb 9.6 oz (56.1 kg)  ?Height: 5\' 5"  (1.651 m)  ? ? ?Exam ?General appearance : Awake, alert, not in any distress. Speech Clear. Not toxic looking ?HEENT: Atraumatic and Normocephalic, pupils equally reactive to light and accomodation ?Neck: Supple, no JVD. No cervical lymphadenopathy.  ?Chest: Good air entry bilaterally, no added sounds  ?CVS: S1 S2 regular, no murmurs.  ?Abdomen: Bowel sounds present, Non tender and not distended with no gaurding, rigidity or rebound. ?Extremities: B/L Lower Ext shows no edema, both legs are warm to touch ?Neurology: Awake alert, and oriented X 3, CN II-XII intact, Non focal ?Skin: No Rash ? ?Data Review ?Lab Results  ?Component Value Date  ? HGBA1C 5.5 01/06/2022  ? HGBA1C 5.6 01/23/2021  ? ? ?Assessment & Plan  ?Todd Vega was seen today for follow-up. ? ?Diagnoses and all orders for this visit: ? ?Eczema herpeticum ? ?Np f/u call from [facility] he was prescribe ? Start oral valacyclovir 1000 mg PO x 10  days ?? Start oral gabapentin 100-300 mg QHS PRN for symptomatic relief  ?? Start topical silvadene 1% cream BID PRN to affected area  ?? Recommend use of bland emollient (Vaseline), may mix with topical silvadene  ?We will call the patient to schedule a follow up appointment for continued management.   ? ?Other orders ?-     gabapentin (NEURONTIN) 100 MG capsule; Take 1 capsule (100 mg total) by mouth 3 (three) times daily. ? ?  ?There are no diagnoses linked to this encounter. ? ? ?Patient have been counseled extensively about nutrition and exercise. Other issues discussed during this visit include: low cholesterol diet, weight control and daily exercise, foot care, annual eye examinations at Ophthalmology, importance of adherence with  medications and regular follow-up. We also discussed long term complications of uncontrolled diabetes and hypertension.  ? ?Return for Reccomended to f/u with [facility] as he was to recieve an appt. . ? ?The patient was given clear instructions to go to ER or return to medical center if symptoms don't improve, worsen or new problems develop. The patient verbalized understanding. The patient was told to call to get lab results if they haven't heard anything in the next week.  ? ?This note has been created with Education officer, environmental. Any transcriptional errors are unintentional.  ? ?[name], NP ?01/30/2022, 4:39 PM  ?

## 2022-01-27 ENCOUNTER — Other Ambulatory Visit: Payer: Self-pay

## 2022-01-28 ENCOUNTER — Other Ambulatory Visit: Payer: Self-pay

## 2022-02-01 ENCOUNTER — Other Ambulatory Visit: Payer: Self-pay

## 2022-02-04 ENCOUNTER — Telehealth: Payer: Self-pay

## 2022-02-04 NOTE — Telephone Encounter (Signed)
**Note De-Identified Todd Vega Obfuscation** A completed BMSPAF application for Eliquis was left at the office without any documents. ? ?I completed the providers page of the application and have e-mailed all to the nurse working with Dr Elease Hashimoto today so she can obtain his signature, date it, and to fax all to [facility] at the fax number written on the cover letter included. ?

## 2022-02-04 NOTE — Telephone Encounter (Signed)
BMSPAF application for Eliquis signed by Dr. Acie Fredrickson and faxed to [facility]. ?

## 2022-02-04 NOTE — Telephone Encounter (Signed)
**Note De-Identified Primrose Oler Obfuscation** Per the pts request I did call him to let him know that I have completed the providers page of his BMSPAF application for Eliquis and that I have e-mailed it to the nurse working with Dr Elease Hashimoto today so she can obtain his signature, date it, and that she will then fax all to [facility] today. ? ?He thanked me for calling with update on his application. ?

## 2022-02-10 ENCOUNTER — Telehealth: Payer: Self-pay

## 2022-02-10 NOTE — Telephone Encounter (Signed)
Copied from [place] [phone]. Topic: Appointment Scheduling - Scheduling Inquiry for Clinic ?>> Feb 10, 2022  9:03 AM Scherrie Gerlach wrote: ?Reason for CRM: pt would like to know if Sharyn Lull can work him in for appt on May 5?  This is the last day Sharyn Lull has taken him out of work.  He would like her to look at his face to see if he can return to work.  Pt has appt w/ dermatolgy on 5/04.  Please advise ?

## 2022-02-10 NOTE — Telephone Encounter (Signed)
Called patient to explained took him out of work until seen by dermatology to determine if contagious and if more tx needed ?

## 2022-02-16 NOTE — Telephone Encounter (Signed)
Pt called in to ask what time his appointment is on Friday I advised him that he does not have an appointment scheduled for Friday. I advised him, per PCP message below, that she wanted him to see a dermatologist first. ? ?Pt mentioned that his appointment with the dermatologist is tomorrow.  ? ? ? ? ?Pt requesting a call back. ?

## 2022-02-17 ENCOUNTER — Other Ambulatory Visit: Payer: Self-pay

## 2022-02-17 ENCOUNTER — Other Ambulatory Visit (INDEPENDENT_AMBULATORY_CARE_PROVIDER_SITE_OTHER): Payer: Self-pay | Admitting: Primary Care

## 2022-02-17 DIAGNOSIS — B000 Eczema herpeticum: Secondary | ICD-10-CM | POA: Insufficient documentation

## 2022-02-17 DIAGNOSIS — L03811 Cellulitis of head [any part, except face]: Secondary | ICD-10-CM

## 2022-02-17 DIAGNOSIS — B0229 Other postherpetic nervous system involvement: Secondary | ICD-10-CM | POA: Insufficient documentation

## 2022-02-17 NOTE — Telephone Encounter (Signed)
Sent to PCP ?

## 2022-02-17 NOTE — Telephone Encounter (Signed)
Patient is aware that once he has his dermatology appointment depending on what they determine will determine if PCP will return him to work or continue to keep him out. He is aware that no appointment will be needed for this.  ?

## 2022-02-18 NOTE — Telephone Encounter (Signed)
Pt went to the dermatologist yesterday. ?Pt was prescribed gabapentin 300 mg 2 times a day. ?Pt states this dose makes him drowsy. ?He states the dermatologist suggested he ask Marcelino Duster for a note to stay out of work until May 20th. ?The dermatologist did tell him that he is no longer contagious. ?Pt would like to pick up the note sometime next week. ?Cb  [phone] ?

## 2022-02-18 NOTE — Telephone Encounter (Signed)
Routed to PCP 

## 2022-02-21 ENCOUNTER — Encounter (INDEPENDENT_AMBULATORY_CARE_PROVIDER_SITE_OTHER): Payer: Self-pay | Admitting: Primary Care

## 2022-02-21 NOTE — Telephone Encounter (Signed)
Called patient reviewed dermatology note - no mention of out of work until Mar 05, 2022. Note from derm states he is not contagious.  Work note completed may return to work without restrictions. ?

## 2022-02-22 ENCOUNTER — Other Ambulatory Visit: Payer: Self-pay

## 2022-02-26 ENCOUNTER — Other Ambulatory Visit: Payer: Self-pay

## 2022-02-26 DIAGNOSIS — I5040 Unspecified combined systolic (congestive) and diastolic (congestive) heart failure: Secondary | ICD-10-CM

## 2022-02-28 ENCOUNTER — Telehealth: Payer: Self-pay | Admitting: Cardiovascular Disease

## 2022-02-28 ENCOUNTER — Other Ambulatory Visit: Payer: Self-pay

## 2022-02-28 DIAGNOSIS — I5040 Unspecified combined systolic (congestive) and diastolic (congestive) heart failure: Secondary | ICD-10-CM

## 2022-02-28 MED ORDER — ENTRESTO 97-103 MG PO TABS: 1.0000 | ORAL_TABLET | Freq: Two times a day (BID) | ORAL | 2 refills | Status: DC

## 2022-02-28 MED ORDER — ENTRESTO 97-103 MG PO TABS
1.0000 | ORAL_TABLET | Freq: Two times a day (BID) | ORAL | 3 refills | Status: DC
  Filled 2022-02-28: qty 180, 90d supply, fill #0

## 2022-02-28 MED ORDER — ENTRESTO 97-103 MG PO TABS
1.0000 | ORAL_TABLET | Freq: Two times a day (BID) | ORAL | 2 refills | Status: DC
  Filled 2022-02-28: qty 180, 90d supply, fill #0

## 2022-02-28 NOTE — Telephone Encounter (Signed)
Pt c/o medication issue: ? ?1. Name of Medication: sacubitril-valsartan (ENTRESTO) 97-103 MG ? ?2. How are you currently taking this medication (dosage and times per day)? As prescribed  ? ?3. Are you having a reaction (difficulty breathing--STAT)? No  ? ?4. What is your medication issue? Patient is calling stating his pharmacy advised him that they are needing something from our office in order for him to continue to get this medication at the discounted price. Requesting a detailed VM be left in regards to this due to him being at work and unable to answer.  ?

## 2022-02-28 NOTE — Telephone Encounter (Signed)
Pt's medication was sent to pt's pharmacy as requested. Confirmation received.  °

## 2022-02-28 NOTE — Telephone Encounter (Signed)
Unclear what he is referring to. Does not look like patient has prescription drug insurance. In which case his next step would be to apply for patient assistance. ?

## 2022-02-28 NOTE — Telephone Encounter (Signed)
**Note De-Identified [name]** I called the pt and left a VM message asking him to call us back so we can get a better understanding of what he is needing. ? ?Per the pts chart he did apply for Eliquis asst through [employer] on 4/21 but no mention of Entresto assistance. ?Also, per his chart his [name] was e-scribed to [facility] but this is incorrect as that is the pharmacy for [employer] (Eliquis asst) not [employer] (Entresto asst). ?

## 2022-02-28 NOTE — Telephone Encounter (Signed)
Patient is returning call.  °

## 2022-02-28 NOTE — Telephone Encounter (Signed)
?*  STAT* If patient is at the pharmacy, call can be transferred to refill team. ? ? ?1. Which medications need to be refilled? (please list name of each medication and dose if known) sacubitril-valsartan (ENTRESTO) 97-103 MG ? ?2. Which pharmacy/location (including street and city if local pharmacy) is medication to be sent to? [place] [facility] ? ?3. Do they need a 30 day or 90 day supply? 90 ? ?

## 2022-03-01 ENCOUNTER — Other Ambulatory Visit: Payer: Self-pay

## 2022-03-01 DIAGNOSIS — I5040 Unspecified combined systolic (congestive) and diastolic (congestive) heart failure: Secondary | ICD-10-CM

## 2022-03-01 MED ORDER — ENTRESTO 97-103 MG PO TABS: 1.0000 | ORAL_TABLET | Freq: Two times a day (BID) | ORAL | 3 refills | Status: DC

## 2022-03-01 NOTE — Telephone Encounter (Signed)
**Note De-Identified Todd Vega Obfuscation** The pt states that he just wants [place] to know that he is going to apply for asst for Entresto through [place]. ?He states that he has already s/w [employer] and that they advised him that they are mailing him an application. ?He is aware that once he receives his application to complete his part, obtain required documents per [employer], and to bring all to Dr Elmarie Shiley office at [employer] at [employer] [address] in [place] to drop off in the front office and that we will take care of the providers page and will fax all to [employer]. ?He verbalized understanding and thanked me for calling him back. ? ?He states that he currently has Entresto on hand and is not in need of samples of Entresto at this time. ? ? ?

## 2022-03-03 ENCOUNTER — Ambulatory Visit: Payer: Self-pay | Admitting: Dermatology

## 2022-03-10 ENCOUNTER — Telehealth: Payer: Self-pay

## 2022-03-10 NOTE — Telephone Encounter (Signed)
**Note De-Identified Gio Janoski Obfuscation** The pt left his completed [employer] pt asst application for Entresto at the office.   I have completed the providers page of the application and e-mailed all to Janan Halter, Clinical supervisor so she can print a prescription for Entresto 97-103 mg with 3 refills, have Dr Curt Bears (DOD) sign/date it and the application in Dr Elmarie Shiley absence, and to then fax all to [employer] at the fax number written on the cover letter included.

## 2022-03-10 NOTE — Telephone Encounter (Deleted)
**Note De-Identified Jaeceon Michelin Obfuscation** The pt left his completed [employer] pt asst application for Entresto at the office.  I have completed the providers page of the application and e-mailed all to Dennis Bast, Clinical supervisor so she can print a prescription for Entresto 97-103 mg with 3 refills, have Dr Elberta Fortis sign/date it and the application, and to then fax all to [employer] at the fax number written on the cover letter included.

## 2022-03-11 NOTE — Telephone Encounter (Signed)
Forms signed and faxed in with a 90 day prescription. Confirmation received.

## 2022-03-15 ENCOUNTER — Other Ambulatory Visit: Payer: Self-pay

## 2022-03-18 ENCOUNTER — Other Ambulatory Visit: Payer: Self-pay

## 2022-03-18 MED ORDER — DAPAGLIFLOZIN PROPANEDIOL 10 MG PO TABS
10.0000 mg | ORAL_TABLET | Freq: Every day | ORAL | 3 refills | Status: DC
  Filled 2022-03-18: qty 30, 30d supply, fill #0

## 2022-03-18 NOTE — Telephone Encounter (Signed)
Pt's medication was sent to pt's pharmacy as requested. Confirmation received.  °

## 2022-03-24 ENCOUNTER — Other Ambulatory Visit: Payer: Self-pay

## 2022-03-24 ENCOUNTER — Telehealth: Payer: Self-pay | Admitting: Cardiovascular Disease

## 2022-03-24 NOTE — Telephone Encounter (Signed)
**Note De-Identified Henri Baumler Obfuscation** The pt and his sister state that he has been getting his [place] from [facility] and [place] pt asst [facility] and that he ran out so they sent him a 30 day supply until he can re-apply.  I gave them [place] and [place]'s phone number so he can call them to re-apply. He and his sister are aware to call Larita Fife back at Dr Harvie Bridge office at [phone] if they have any questions or concerns.  They thanked me for calling them back.

## 2022-03-24 NOTE — Telephone Encounter (Signed)
No, Todd Vega is brand only. It looks like he has Nurse, learning disability and just needs to be set up with a copay card.

## 2022-03-24 NOTE — Telephone Encounter (Signed)
Duplicate. See other telephone note from today

## 2022-03-24 NOTE — Telephone Encounter (Signed)
Pt c/o medication issue:  1. Name of Medication:   dapagliflozin propanediol ([name]) 10 MG TABS tablet    2. How are you currently taking this medication (dosage and times per day)? Take 1 tablet (10 mg total) by mouth daily before breakfast  3. Are you having a reaction (difficulty breathing--STAT)? No  4. What is your medication issue? Pt would like to know if he is able to get the generic brand of this medication due to the cost being too much. Please advise

## 2022-03-24 NOTE — Telephone Encounter (Signed)
**Note De-Identified Zivah Mayr Obfuscation** It was brought to my attention that the pt does have [employer] Ins medication coverage per the "Verify RX Benefits tab" in the pts chart so I called [facility] at [facility] (Ph: [phone]) to provide the following [name] co-pay card information   BIN#: [id number] PCN#: [id number] GRP: [id number]  Per their message they were having a high call volume and requested that I leave them a VM and they would call me back. I did leave a VM requesting a call back.

## 2022-03-24 NOTE — Telephone Encounter (Signed)
Pt c/o medication issue:  1. Name of [place]   2. How are you currently taking this medication (dosage and times per day)?    3. Are you having a reaction (difficulty breathing--STAT)?   4. What is your medication issue? Wants to know if there is a generic for this medicine- too expensive

## 2022-03-24 NOTE — Telephone Encounter (Signed)
After speaking with the pharmacy they said the patient previously received patient assistance for the medication and it is expired.

## 2022-03-25 ENCOUNTER — Other Ambulatory Visit: Payer: Self-pay

## 2022-03-25 NOTE — Telephone Encounter (Signed)
**Note De-Identified Jasemine Nawaz Obfuscation** I called  [facility] at [facility] (Ph: [phone]) and gave them the information from a [place] co-pay card to place in the pts file for his next refill. They advised me that he picked up a 30 day fill of Farixga on 6/2 from [facility] and that he needs to apply for [place]st through [place] and [place].  I advised her that the pt does have Cign[employer] ins now so using the co-pay card would be the best option as far as cost for the pt. She placed me on hold then came back to the call and stated that they now see the pts Cigna ins info in the pts chart and agreed that the co-pay card is the best option.  I did provide them with the following information from a [place] co-pay card: BIN#: [id number] PCN#: [id number] GRP: [id number] ID: [id number] I was advised that they have added this info to the pts [name] refill notes.  The pharmacist thanked me for my call.

## 2022-03-29 ENCOUNTER — Other Ambulatory Visit: Payer: Self-pay

## 2022-04-25 ENCOUNTER — Telehealth: Payer: Self-pay | Admitting: Cardiovascular Disease

## 2022-04-25 ENCOUNTER — Other Ambulatory Visit: Payer: Self-pay | Admitting: Cardiovascular Disease

## 2022-04-25 DIAGNOSIS — I5040 Unspecified combined systolic (congestive) and diastolic (congestive) heart failure: Secondary | ICD-10-CM

## 2022-04-25 DIAGNOSIS — I513 Intracardiac thrombosis, not elsewhere classified: Secondary | ICD-10-CM

## 2022-04-25 NOTE — Telephone Encounter (Signed)
*  STAT* If patient is at the pharmacy, call can be transferred to refill team.   1. Which medications need to be refilled? (please list name of each medication and dose if known) \ atorvastatin (LIPITOR) 80 MG tablet ezetimibe (ZETIA) 10 MG tablet FLUoxetine (PROZAC) 20 MG capsule  sacubitril-valsartan (ENTRESTO) 97-103 MG spironolactone (ALDACTONE) 50 MG tablet  2. Which pharmacy/location (including street and city if local pharmacy) is medication to be sent to? [facility] - [place], [place] - [address]  3. Do they need a 30 day or 90 day supply? 90 with refills  Patient now uses a mail order pharmacy and will need all meds sent to his mail order pharmacy

## 2022-04-25 NOTE — Telephone Encounter (Signed)
*  STAT* If patient is at the pharmacy, call can be transferred to refill team.   1. Which medications need to be refilled? (please list name of each medication and dose if known)   dapagliflozin propanediol ([name]) 10 MG TABS tablet  2. Which pharmacy/location (including street and city if local pharmacy) is medication to be sent to?  [facility] [phone] - [place], [place] - [address]  3. Do they need a 30 day or 90 day supply?   30 day   Patient is out of this medication

## 2022-04-26 MED ORDER — EZETIMIBE 10 MG PO TABS: 10.0000 mg | ORAL_TABLET | Freq: Every day | ORAL | 2 refills | Status: DC

## 2022-04-26 MED ORDER — DAPAGLIFLOZIN PROPANEDIOL 10 MG PO TABS: 10.0000 mg | ORAL_TABLET | Freq: Every day | ORAL | 2 refills | Status: DC

## 2022-04-26 MED ORDER — CARVEDILOL 25 MG PO TABS: 25.0000 mg | ORAL_TABLET | Freq: Two times a day (BID) | ORAL | 2 refills | Status: DC

## 2022-04-26 MED ORDER — ATORVASTATIN CALCIUM 80 MG PO TABS: 80.0000 mg | ORAL_TABLET | Freq: Every day | ORAL | 2 refills | Status: DC

## 2022-04-26 MED ORDER — ISOSORB DINITRATE-HYDRALAZINE 20-37.5 MG PO TABS: 2.0000 | ORAL_TABLET | Freq: Three times a day (TID) | ORAL | 2 refills | Status: DC

## 2022-04-26 MED ORDER — APIXABAN 5 MG PO TABS: 5.0000 mg | ORAL_TABLET | Freq: Two times a day (BID) | ORAL | 1 refills | Status: DC

## 2022-04-26 MED ORDER — ENTRESTO 97-103 MG PO TABS: 1.0000 | ORAL_TABLET | Freq: Two times a day (BID) | ORAL | 2 refills | Status: DC

## 2022-04-26 MED ORDER — SPIRONOLACTONE 50 MG PO TABS: 50.0000 mg | ORAL_TABLET | Freq: Every day | ORAL | 2 refills | Status: DC

## 2022-04-26 NOTE — Telephone Encounter (Signed)
Prescription refill request for Eliquis received. Indication: LV Thrombus  Last office visit: 12/30/21 ([place])  Scr: 0.78 (01/18/22)  Age: 61 Weight: 56.1kg  Appropriate dose and refill sent to requested pharmacy.

## 2022-04-26 NOTE — Telephone Encounter (Signed)
Pt's medications were sent to pt's pharmacy as requested. Confirmation received.  

## 2022-05-04 ENCOUNTER — Telehealth: Payer: Self-pay

## 2022-05-04 NOTE — Telephone Encounter (Signed)
**Note De-Identified Ancel Easler Obfuscation** Eliquis PA form received Dior Stepter fax from [place]. I have completed the form, attached notes, and e-mailed all to Dr Harvie Bridge nurse so she can obtain his signature/date, and to fax to [place] at the fax number written on the cover letter included.

## 2022-05-05 ENCOUNTER — Telehealth (INDEPENDENT_AMBULATORY_CARE_PROVIDER_SITE_OTHER): Payer: Self-pay | Admitting: Primary Care

## 2022-05-05 DIAGNOSIS — F331 Major depressive disorder, recurrent, moderate: Secondary | ICD-10-CM

## 2022-05-05 DIAGNOSIS — F411 Generalized anxiety disorder: Secondary | ICD-10-CM

## 2022-05-05 NOTE — Telephone Encounter (Signed)
Received fax from [place] stating that Eliquis 5mg  has been approved from 05/04/22 through 05/04/2023.  Request ID: [id number]/[id number]

## 2022-05-05 NOTE — Telephone Encounter (Signed)
Copied from CRM [id number]. Topic: General - Other >> May 05, 2022 12:41 PM Everette C wrote: Reason for CRM: Medication Refill - Medication: Rx #: [id number]  FLUoxetine (PROZAC) 20 MG capsule [[id number]]    Has the patient contacted their pharmacy? No.The patient was directed by their insurance rep to contact their PCP (Agent: If no, request that the patient contact the pharmacy for the refill. If patient does not wish to contact the pharmacy document the reason why and proceed with request.) (Agent: If yes, when and what did the pharmacy advise?)  Preferred Pharmacy (with phone number or street name): [facility] - [place], [place] - [address] [place] [address] Phone: [phone] Fax: [phone] Hours: Not open 24 hours   Has the patient been seen for an appointment in the last year OR does the patient have an upcoming appointment? Yes.    Agent: Please be advised that RX refills may take up to 3 business days. We ask that you follow-up with your pharmacy.

## 2022-05-06 MED ORDER — FLUOXETINE HCL 20 MG PO CAPS: 20.0000 mg | ORAL_CAPSULE | Freq: Every day | ORAL | 0 refills | Status: DC

## 2022-05-06 NOTE — Telephone Encounter (Signed)
Requested Prescriptions  Pending Prescriptions Disp Refills  . FLUoxetine (PROZAC) 20 MG capsule 90 capsule 0    Sig: Take 1 capsule (20 mg total) by mouth daily.     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

## 2022-05-10 DIAGNOSIS — L810 Postinflammatory hyperpigmentation: Secondary | ICD-10-CM | POA: Insufficient documentation

## 2022-05-11 ENCOUNTER — Other Ambulatory Visit (INDEPENDENT_AMBULATORY_CARE_PROVIDER_SITE_OTHER): Payer: Self-pay | Admitting: Primary Care

## 2022-05-11 DIAGNOSIS — F411 Generalized anxiety disorder: Secondary | ICD-10-CM

## 2022-05-11 DIAGNOSIS — F331 Major depressive disorder, recurrent, moderate: Secondary | ICD-10-CM

## 2022-05-11 MED ORDER — FLUOXETINE HCL 20 MG PO CAPS: 20.0000 mg | ORAL_CAPSULE | Freq: Every day | ORAL | 0 refills | Status: DC

## 2022-05-12 ENCOUNTER — Other Ambulatory Visit (INDEPENDENT_AMBULATORY_CARE_PROVIDER_SITE_OTHER): Payer: Self-pay | Admitting: Primary Care

## 2022-05-12 DIAGNOSIS — F411 Generalized anxiety disorder: Secondary | ICD-10-CM

## 2022-05-12 DIAGNOSIS — F331 Major depressive disorder, recurrent, moderate: Secondary | ICD-10-CM

## 2022-05-12 MED ORDER — FLUOXETINE HCL 20 MG PO CAPS: 20.0000 mg | ORAL_CAPSULE | Freq: Every day | ORAL | 0 refills | Status: DC

## 2022-05-12 NOTE — Telephone Encounter (Signed)
Bell, Tiffany M 55 minutes ago (1:26 PM)   TB Caller requesting a short supply of FLUoxetine (PROZAC) 20 MG capsule until patient receives mail order. Please send a new script     [facility] [phone] [place], [place] - [address] AT [facility] OF [address] & [facility] Phone:  [phone]  Fax:  [phone]

## 2022-05-12 NOTE — Telephone Encounter (Signed)
Requested Prescriptions  Pending Prescriptions Disp Refills  . FLUoxetine (PROZAC) 20 MG capsule 90 capsule 0    Sig: Take 1 capsule (20 mg total) by mouth daily.     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

## 2022-05-12 NOTE — Telephone Encounter (Signed)
Created new encounter to ref[facility] due to unable to reorder in this encounter.

## 2022-05-12 NOTE — Telephone Encounter (Signed)
Caller requesting a short supply of FLUoxetine (PROZAC) 20 MG capsule until patient receives mail order. Please send a new script   [facility] [phone] [place], [place] - [address] AT [facility] OF [address] & [facility] Phone:  [phone]  Fax:  [phone]

## 2022-08-10 ENCOUNTER — Other Ambulatory Visit (INDEPENDENT_AMBULATORY_CARE_PROVIDER_SITE_OTHER): Payer: Self-pay | Admitting: Primary Care

## 2022-08-10 DIAGNOSIS — F331 Major depressive disorder, recurrent, moderate: Secondary | ICD-10-CM

## 2022-08-10 DIAGNOSIS — F411 Generalized anxiety disorder: Secondary | ICD-10-CM

## 2022-08-10 NOTE — Telephone Encounter (Signed)
Medication Refill - Medication: FLUoxetine (PROZAC) 20 MG capsule  Has the patient contacted their pharmacy? Yes.     Preferred Pharmacy (with phone number or street name):  [place], [place] Phone:  [phone]  Fax:  [phone]     Has the patient been seen for an appointment in the last year OR does the patient have an upcoming appointment? Yes.     Patient states his insurance Todd Vega will be reaching out to the provider to authorize refill on this prescription. Also patient will call back to schedule a physical and follow up appt. Please assist patient further.

## 2022-08-11 NOTE — Telephone Encounter (Signed)
Attempted to call patient to schedule 6 month follow up- left message to call office. 30 day courtesy RF given Requested Prescriptions  Pending Prescriptions Disp Refills  . 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

## 2022-08-11 NOTE — Telephone Encounter (Signed)
Courtesy RF has been sent to pharmacy- Duplicate request Requested Prescriptions  Pending Prescriptions Disp Refills  . FLUoxetine (PROZAC) 20 MG capsule 10 capsule 0    Sig: Take 1 capsule (20 mg total) by mouth daily.     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

## 2022-08-26 ENCOUNTER — Other Ambulatory Visit (INDEPENDENT_AMBULATORY_CARE_PROVIDER_SITE_OTHER): Payer: Self-pay | Admitting: Primary Care

## 2022-08-26 NOTE — Telephone Encounter (Signed)
Requested medication (s) are due for refill today: no  Requested medication (s) are on the active medication list: yes  Last refill:  04/26/22  Future visit scheduled:no  Notes to clinic:  Unable to refill per protocol, last refill by another provider.      Requested Prescriptions  Pending Prescriptions Disp Refills   dapagliflozin propanediol ([name]) 10 MG TABS tablet 90 tablet 2    Sig: Take 1 tablet (10 mg total) by mouth daily before breakfast.     Endocrinology:  Diabetes - SGLT2 Inhibitors Failed - 08/26/2022 12:50 PM      Failed - HBA1C is between 0 and 7.9 and within 180 days    Hemoglobin A1C  Date Value Ref Range Status  01/06/2022 5.5 4.0 - 5.6 % Final   Hgb A1c MFr Bld  Date Value Ref Range Status  01/23/2021 5.6 4.8 - 5.6 % Final    Comment:    (NOTE) Pre diabetes:          5.7%-6.4%  Diabetes:              >6.4%  Glycemic control for   <7.0% adults with diabetes          Failed - Valid encounter within last 6 months    Recent Outpatient Visits           7 months ago Eczema herpeticum   [place] Kerin Perna, NP   7 months ago 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   [facility] Kerin Perna, NP   7 months ago 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   [place] Juluis Mire P, NP   11 months ago Combined systolic and diastolic heart failure, unspecified HF chronicity ([place])   [place] Kerin Perna, NP   1 year ago Moderate episode of recurrent major depressive disorder ([place])   [place] Kerin Perna, NP              Passed - Cr in normal range and within 360 days    Creatinine, Ser  Date Value Ref Range Status  12/30/2021 0.83 0.76 - 1.27 mg/dL Final         Passed - eGFR in normal range and within 360 days    GFR, Estimated   Date Value Ref Range Status  02/02/2021 >60 >60 mL/min Final    Comment:    (NOTE) Calculated using the CKD-EPI Creatinine Equation (2021)    eGFR  Date Value Ref Range Status  12/30/2021 100 >59 mL/min/1.73 Final

## 2022-08-26 NOTE — Telephone Encounter (Unsigned)
Copied from CRM [id number]. Topic: General - Other >> Aug 26, 2022  9:45 AM Everette C wrote: Reason for CRM: Medication Refill - Medication: dapagliflozin propanediol ([name]) 10 MG TABS tablet [[id number]]  Has the patient contacted their pharmacy? Yes.  The patient has been directed to contact their PCP  (Agent: If no, request that the patient contact the pharmacy for the refill. If patient does not wish to contact the pharmacy document the reason why and proceed with request.) (Agent: If yes, when and what did the pharmacy advise?)  Preferred Pharmacy (with phone number or street name): [facility] [phone] - [place], [place] - [address] AT [facility] OF [address] & [address] [facility] [address] Phone: [phone] Fax: [phone] Hours: Not open 24 hours   Has the patient been seen for an appointment in the last year OR does the patient have an upcoming appointment? Yes.    Agent: Please be advised that RX refills may take up to 3 business days. We ask that you follow-up with your pharmacy.

## 2022-09-06 ENCOUNTER — Other Ambulatory Visit (INDEPENDENT_AMBULATORY_CARE_PROVIDER_SITE_OTHER): Payer: Self-pay | Admitting: Primary Care

## 2022-09-06 DIAGNOSIS — F331 Major depressive disorder, recurrent, moderate: Secondary | ICD-10-CM

## 2022-09-06 DIAGNOSIS — F411 Generalized anxiety disorder: Secondary | ICD-10-CM

## 2022-09-06 NOTE — Telephone Encounter (Signed)
Requested medication (s) are due for refill today - yes  Requested medication (s) are on the active medication list -yes  Future visit scheduled -no  Last refill: 08/11/22 #30  Notes to clinic: last refill was courtesy fill- request sent for review   Requested Prescriptions  Pending Prescriptions Disp Refills   FLUoxetine (PROZAC) 20 MG capsule [Pharmacy Med Name: FLUOXETINE HCL CAPS 20MG ] 30 capsule 11    Sig: TAKE 1 CAPSULE DAILY     Psychiatry:  Antidepressants - SSRI Failed - 09/06/2022  9:30 AM      Failed - Valid encounter within last 6 months    Recent Outpatient Visits           7 months ago Eczema herpeticum   [facility] 09/08/2022 P, NP   7 months ago 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   [facility] Gwinda Passe P, NP   8 months ago 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   [facility] Gwinda Passe P, NP   11 months ago Combined systolic and diastolic heart failure, unspecified HF chronicity (HCC)   [facility] Gwinda Passe P, NP   1 year ago Moderate episode of recurrent major depressive disorder (HCC)   [facility] Gwinda Passe, NP              Passed - Completed PHQ-2 or PHQ-9 in the last 360 days         Requested Prescriptions  Pending Prescriptions Disp Refills   FLUoxetine (PROZAC) 20 MG capsule [Pharmacy Med Name: FLUOXETINE HCL CAPS 20MG ] 30 capsule 11    Sig: TAKE 1 CAPSULE DAILY     Psychiatry:  Antidepressants - SSRI Failed - 09/06/2022  9:30 AM      Failed - Valid encounter within last 6 months    Recent Outpatient Visits           7 months ago Eczema herpeticum   [facility] P, NP   7 months ago 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   [facility] 09/08/2022 P, NP   8 months ago 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

## 2022-09-06 NOTE — Telephone Encounter (Signed)
Will forward to provider  

## 2022-12-15 ENCOUNTER — Telehealth: Payer: Self-pay | Admitting: Cardiovascular Disease

## 2022-12-15 MED ORDER — EZETIMIBE 10 MG PO TABS: 10.0000 mg | ORAL_TABLET | Freq: Every day | ORAL | 0 refills | Status: DC

## 2022-12-15 MED ORDER — CARVEDILOL 25 MG PO TABS: 25.0000 mg | ORAL_TABLET | Freq: Two times a day (BID) | ORAL | 0 refills | Status: DC

## 2022-12-15 NOTE — Telephone Encounter (Signed)
 *  STAT* If patient is at the pharmacy, call can be transferred to refill team.   1. Which medications need to be refilled? (please list name of each medication and dose if known) carvedilol (COREG) 25 MG tablet  ezetimibe (ZETIA) 10 MG tablet   2. Which pharmacy/location (including street and city if local pharmacy) is medication to be sent to? [place], [place]   3. Do they need a 30 day or 90 day supply? 90 days

## 2022-12-15 NOTE — Telephone Encounter (Signed)
Pt's medications were sent to pt's pharmacy as requested. Confirmation received.  

## 2022-12-28 ENCOUNTER — Encounter (INDEPENDENT_AMBULATORY_CARE_PROVIDER_SITE_OTHER): Payer: Self-pay

## 2023-01-09 ENCOUNTER — Other Ambulatory Visit: Payer: Self-pay

## 2023-01-09 DIAGNOSIS — I513 Intracardiac thrombosis, not elsewhere classified: Secondary | ICD-10-CM

## 2023-01-09 MED ORDER — APIXABAN 5 MG PO TABS: 5.0000 mg | ORAL_TABLET | Freq: Two times a day (BID) | ORAL | 0 refills | Status: DC

## 2023-01-09 NOTE — Telephone Encounter (Signed)
Prescription refill request for Eliquis received from [facility]. Indication: LV Thrombus  Last office visit: 12/30/21 (Nahser)  Scr: 0.78 (01/18/22)  Age: 62 Weight: 56.1kg  Appropriate dose.  Pt overdue to see cardiologist. Called pt, no answer. Left message on voicemail. Made pt aware that we are unable to refill medication in the future until he has appt with provider.

## 2023-01-19 ENCOUNTER — Other Ambulatory Visit: Payer: Self-pay

## 2023-01-19 DIAGNOSIS — I5040 Unspecified combined systolic (congestive) and diastolic (congestive) heart failure: Secondary | ICD-10-CM

## 2023-01-19 MED ORDER — ENTRESTO 97-103 MG PO TABS: 1.0000 | ORAL_TABLET | Freq: Two times a day (BID) | ORAL | 0 refills | Status: DC

## 2023-02-10 ENCOUNTER — Other Ambulatory Visit: Payer: Self-pay

## 2023-02-17 ENCOUNTER — Other Ambulatory Visit: Payer: Self-pay

## 2023-02-17 MED ORDER — ATORVASTATIN CALCIUM 80 MG PO TABS: 80.0000 mg | ORAL_TABLET | Freq: Every day | ORAL | 0 refills | Status: DC

## 2023-03-01 ENCOUNTER — Other Ambulatory Visit: Payer: Self-pay

## 2023-03-01 MED ORDER — SPIRONOLACTONE 50 MG PO TABS: 50.0000 mg | ORAL_TABLET | Freq: Every day | ORAL | 1 refills | Status: DC

## 2023-03-08 ENCOUNTER — Ambulatory Visit: Payer: PRIVATE HEALTH INSURANCE | Admitting: Physician Assistant

## 2023-03-10 ENCOUNTER — Telehealth: Payer: Self-pay | Admitting: Cardiovascular Disease

## 2023-03-10 NOTE — Telephone Encounter (Signed)
Patient stated he will be dropping off paperwork for [employer] medication assistance next Tuesday (5/28).

## 2023-03-15 ENCOUNTER — Telehealth: Payer: Self-pay | Admitting: Cardiovascular Disease

## 2023-03-15 NOTE — Telephone Encounter (Signed)
Paper Work Dropped Off: [employer] patient assistance  Date: 03/15/23  Location of paper:  Cathys folder at [employer]

## 2023-03-15 NOTE — Telephone Encounter (Signed)
Pt's patient assistance paperwork was scanned to [facility], [employer]. Thanks

## 2023-03-15 NOTE — Telephone Encounter (Signed)
**Note De-Identified Silvanna Ohmer Obfuscation** The pts Nov[employer] pt asst application for Entresto assistance was left at the office with documents (33 pages).  Because there are so many pages, I had difficulties getting the fax to scan correctly so I went through the pts application and removed 12 pages that were not required which were his receipts from [employer] and some of the tax forms as [employer] only needs his proof of income (found on number 67 which is listed on the 2nd page) and not his entire tax report.  I have completed the providers page of his application and have e-mailed all to Dr Harvie Bridge nurse so she can obtain his signature, date it, and to then fax all to [employer] at the fax number written on the cover letter included.

## 2023-03-15 NOTE — Telephone Encounter (Signed)
**Note De-Identified Maxwell Martorano Obfuscation** See phone note in the pts chart from today (5/29) for details.

## 2023-03-16 ENCOUNTER — Other Ambulatory Visit: Payer: Self-pay

## 2023-03-16 MED ORDER — ISOSORB DINITRATE-HYDRALAZINE 20-37.5 MG PO TABS: 2.0000 | ORAL_TABLET | Freq: Three times a day (TID) | ORAL | 0 refills | Status: DC

## 2023-03-16 NOTE — Telephone Encounter (Signed)
Printed, signed, faxed and confirmation received.

## 2023-03-16 NOTE — Telephone Encounter (Signed)
**Note De-Identified Todd Vega Obfuscation** Letter received from [employer] stating that they approved the pt for Entresto assistance until 10/17/2023. Pt ID: [id number]  The letter states that they have notified the pt of this approval as well.

## 2023-03-16 NOTE — Telephone Encounter (Signed)
Pt's medication was sent to pt's pharmacy as requested. Confirmation received.  °

## 2023-03-21 ENCOUNTER — Telehealth: Payer: Self-pay | Admitting: Cardiovascular Disease

## 2023-03-21 ENCOUNTER — Other Ambulatory Visit (HOSPITAL_COMMUNITY): Payer: Self-pay

## 2023-03-21 ENCOUNTER — Telehealth: Payer: Self-pay

## 2023-03-21 MED ORDER — ISOSORB DINITRATE-HYDRALAZINE 20-37.5 MG PO TABS: 2.0000 | ORAL_TABLET | Freq: Three times a day (TID) | ORAL | 0 refills | Status: DC

## 2023-03-21 NOTE — Addendum Note (Signed)
Addended by: Frutoso Schatz on: 03/21/2023 05:11 PM   Modules accepted: Orders

## 2023-03-21 NOTE — Telephone Encounter (Signed)
Left message for patient to call back  

## 2023-03-21 NOTE — Telephone Encounter (Signed)
*  STAT* If patient is at the pharmacy, call can be transferred to refill team.   1. Which medications need to be refilled? (please list name of each medication and dose if known) isosorbide-hydrALAZINE (BIDIL) 20-37.5 MG tablet   2. Which pharmacy/location (including street and city if local pharmacy) is medication to be sent to? [id number] - [place], [place] - [address]   3. Do they need a 30 day or 90 day supply? 90

## 2023-03-21 NOTE — Telephone Encounter (Signed)
*  [facility]  PA request received for BiDil 20-37.5MG  tablets  PA submitted to [employer] via fax through [facility]  PA is pending determination  Key: [id number]

## 2023-03-21 NOTE — Telephone Encounter (Signed)
Called patient who states that he tried to pick up his isosorbide-hydralazine but [facility] stated that he needed to contact our office. I have sent him in a refill and advised him to keep appointment in July.

## 2023-03-21 NOTE — Telephone Encounter (Signed)
Patient is returning call. Requesting return call.  

## 2023-03-21 NOTE — Telephone Encounter (Signed)
Pt is requesting a callback to discuss his medications. Please advise He couldn't specify which one because he's at work, did request to be called before 12 because of work. Please advise

## 2023-03-21 NOTE — Telephone Encounter (Signed)
Pt's medication was sent to pt's pharmacy on 03/16/23 with a 90 day supply. Confirmation received.

## 2023-03-22 ENCOUNTER — Ambulatory Visit
Admission: EM | Admit: 2023-03-22 | Discharge: 2023-03-22 | Disposition: A | Discharge status: Home / Self Care | Payer: PRIVATE HEALTH INSURANCE | Attending: Physician Assistant | Admitting: Physician Assistant

## 2023-03-22 DIAGNOSIS — H60391 Other infective otitis externa, right ear: Secondary | ICD-10-CM

## 2023-03-22 MED ORDER — NEOMYCIN-POLYMYXIN-HC 3.5-10000-1 OT SOLN: 4.0000 [drp] | Freq: Four times a day (QID) | OTIC | 0 refills | Status: AC

## 2023-03-22 NOTE — Discharge Instructions (Addendum)
Return if any problems.

## 2023-03-22 NOTE — ED Triage Notes (Signed)
Pt present lose of hearing in right ear, symptoms started a week ago after having a shingle flare up. Pt  states having a swooshing sound in his right ear and its itching.

## 2023-03-22 NOTE — ED Provider Notes (Signed)
[facility]    CSN: [id number] Arrival date & time: 03/22/23  4098      History   Chief Complaint Chief Complaint  Patient presents with   Hearing Problem    HPI Todd Vega is a 62 y.o. male.   Patient complains of decreased hearing in his right ear.  Patient reports he recently had shingles.  He states the rash has resolved but he still has decreased hearing in his right ear.  Patient states he feels like something is itching in his right ear  The history is provided by the patient. No language interpreter was used.  Ear Fullness This is a new problem. The problem occurs constantly. Nothing aggravates the symptoms.    Past Medical History:  Diagnosis Date   Asthma due to seasonal allergies     Patient Active Problem List   Diagnosis Date Noted   Chronic combined systolic and diastolic CHF (congestive heart failure) (HCC) 12/30/2021   CAD (coronary artery disease) 07/09/2021   Depression 05/25/2021   Generalized anxiety disorder 05/25/2021   Combined systolic and diastolic heart failure (HCC) 01/26/2021   Hyperlipidemia 01/26/2021   Hyponatremia 01/26/2021   Alcohol abuse 01/26/2021   Hypomagnesemia 01/26/2021   Acute CVA (cerebrovascular accident) (HCC) 01/23/2021   HTN (hypertension) 01/23/2021   CVA (cerebral vascular accident) (HCC) 01/23/2021    Past Surgical History:  Procedure Laterality Date   BUBBLE STUDY  01/25/2021   Procedure: BUBBLE STUDY;  Surgeon: Jodelle Red, MD;  Location: [facility] ENDOSCOPY;  Service: Cardiovascular;;   RIGHT HEART CATH AND CORONARY ANGIOGRAPHY N/A 01/27/2021   Procedure: RIGHT HEART CATH AND CORONARY ANGIOGRAPHY;  Surgeon: Lyn Records, MD;  Location: [facility] INVASIVE CV LAB;  Service: Cardiovascular;  Laterality: N/A;   TEE WITHOUT CARDIOVERSION N/A 01/25/2021   Procedure: TRANSESOPHAGEAL ECHOCARDIOGRAM (TEE);  Surgeon: Jodelle Red, MD;  Location: [facility] ENDOSCOPY;  Service: Cardiovascular;  Laterality:  N/A;       Home Medications    Prior to Admission medications   Medication Sig Start Date End Date Taking? Authorizing Provider  neomycin-polymyxin-hydrocortisone (CORTISPORIN) OTIC solution Place 4 drops into the right ear 4 (four) times daily for 10 days. 03/22/23 04/01/23 Yes Elson Areas, PA-C  apixaban (ELIQUIS) 5 MG TABS tablet Take 1 tablet (5 mg total) by mouth 2 (two) times daily. MUST SCHEDULE APPT WITH CARDIOLOGIST FOR FUTURE REFILLS 01/09/23   Nahser, Deloris Ping, MD  aspirin 81 MG EC tablet Take 1 tablet (81 mg total) by mouth daily. Swallow whole. 01/28/21   Danford, Earl Lites, MD  atorvastatin (LIPITOR) 80 MG tablet Take 1 tablet (80 mg total) by mouth daily. 02/17/23   Nahser, Deloris Ping, MD  carvedilol (COREG) 25 MG tablet Take 1 tablet (25 mg total) by mouth 2 (two) times daily with a meal. 12/15/22   Nahser, Deloris Ping, MD  dapagliflozin propanediol ([name]) 10 MG TABS tablet Take 1 tablet (10 mg total) by mouth daily before breakfast. 04/26/22   Nahser, Deloris Ping, MD  ezetimibe (ZETIA) 10 MG tablet Take 1 tablet (10 mg total) by mouth daily. 12/15/22   Nahser, Deloris Ping, MD  fexofenadine (ALLEGRA) 180 MG tablet Take 180 mg by mouth as needed for allergies or rhinitis.    [provider]  FLUoxetine (PROZAC) 20 MG capsule TAKE 1 CAPSULE DAILY 09/07/22   [name], NP  gabapentin (NEURONTIN) 100 MG capsule Take 1 capsule (100 mg total) by mouth 3 (three) times daily. 01/26/22   Gwinda Passe  P, NP  isosorbide-hydrALAZINE (BIDIL) 20-37.5 MG tablet Take 2 tablets by mouth 3 (three) times daily. 03/21/23   Nahser, Deloris Ping, MD  sacubitril-valsartan (ENTRESTO) 97-103 MG Take 1 tablet by mouth 2 (two) times daily. 01/19/23   Nahser, Deloris Ping, MD  spironolactone (ALDACTONE) 50 MG tablet Take 1 tablet (50 mg total) by mouth daily. 03/01/23   Nahser, Deloris Ping, MD  triamcinolone cream (KENALOG) 0.1 % Apply 1 application. topically 2 (two) times daily. 01/17/22   [name], NP    Family History History reviewed. No pertinent family history.  Social History Social History   Tobacco Use   Smoking status: Never   Smokeless tobacco: Never  Vaping Use   Vaping Use: Never used  Substance Use Topics   Alcohol use: Yes    Alcohol/week: 6.0 standard drinks of alcohol    Types: 6 Cans of beer per week   Drug use: Never     Allergies   Chocolate and Cocos nucifera   Review of Systems Review of Systems  All other systems reviewed and are negative.    Physical Exam Triage Vital Signs ED Triage Vitals  Enc Vitals Group     BP 03/22/23 0909 (!) 146/81     Pulse Rate 03/22/23 0909 75     Resp 03/22/23 0909 18     Temp 03/22/23 0909 97.8 F (36.6 C)     Temp Source 03/22/23 0909 Oral     SpO2 03/22/23 0909 98 %     Weight --      Height --      Head Circumference --      Peak Flow --      Pain Score 03/22/23 0908 0     Pain Loc --      Pain Edu? --      Excl. in GC? --    No data found.  Updated Vital Signs BP (!) 146/81 (BP Location: Right Arm)   Pulse 75   Temp 97.8 F (36.6 C) (Oral)   Resp 18   SpO2 98%   Visual Acuity Right Eye Distance:   Left Eye Distance:   Bilateral Distance:    Right Eye Near:   Left Eye Near:    Bilateral Near:     Physical Exam Vitals and nursing note reviewed.  Constitutional:      Appearance: He is well-developed.  HENT:     Head: Normocephalic.     Ears:     Comments: Right TM is clear ear canal appears swollen, Eyes:     Pupils: Pupils are equal, round, and reactive to light.  Pulmonary:     Effort: Pulmonary effort is normal.  Abdominal:     General: There is no distension.  Musculoskeletal:        General: Normal range of motion.     Cervical back: Normal range of motion.  Neurological:     Mental Status: He is alert and oriented to person, place, and time.      UC Treatments / Results  Labs (all labs ordered are listed, but only abnormal results are displayed) Labs  Reviewed - No data to display  EKG   Radiology No results found.  Procedures Procedures (including critical care time)  Medications Ordered in UC Medications - No data to display  Initial Impression / Assessment and Plan / UC Course  I have reviewed the triage vital signs and the nursing notes.  Pertinent labs & imaging results  that were available during my care of the patient were reviewed by me and considered in my medical decision making (see chart for details).    I suspect patient has some inflammation to his ear canal secondary to previous shingles infection.  I will try patient on Cortisporin otic to see if this helps with symptoms he is advised to follow-up with his primary care physician for recheck.  Final Clinical Impressions(s) / UC Diagnoses   Final diagnoses:  Otitis, externa, infective, right     Discharge Instructions      Return if any problems.     ED Prescriptions     Medication Sig Dispense Auth. Provider   neomycin-polymyxin-hydrocortisone (CORTISPORIN) OTIC solution Place 4 drops into the right ear 4 (four) times daily for 10 days. 10 mL Elson Areas, [place]      PDMP not reviewed this encounter. An After Visit Summary was printed and given to the patient.       Elson Areas, [place] 03/22/23 1058

## 2023-03-23 NOTE — Telephone Encounter (Signed)
Pharmacy Patient Advocate Encounter  Prior Authorization for Generic Bidil has been APPROVED by  [employer]  from 03/22/2023 to 03/21/2024.

## 2023-03-24 ENCOUNTER — Telehealth: Payer: Self-pay | Admitting: Cardiovascular Disease

## 2023-03-24 MED ORDER — SPIRONOLACTONE 50 MG PO TABS: 50.0000 mg | ORAL_TABLET | Freq: Every day | ORAL | 0 refills | Status: DC

## 2023-03-24 NOTE — Telephone Encounter (Signed)
*  STAT* If patient is at the pharmacy, call can be transferred to refill team.   1. Which medications need to be refilled? (please list name of each medication and dose if known) spironolactone (ALDACTONE) 50 MG tablet   2. Which pharmacy/location (including street and city if local pharmacy) is medication to be sent to?  [facility] #[id number] - , [place] - [address]    3. Do they need a 30 day or 90 day supply? 90

## 2023-03-24 NOTE — Telephone Encounter (Signed)
Spironolactone refill sent to the patient's pharmacy.

## 2023-03-28 ENCOUNTER — Telehealth: Payer: Self-pay | Admitting: Primary Care

## 2023-03-28 NOTE — Telephone Encounter (Signed)
Copied from CRM [id number]. Topic: General - Call Back - No Documentation >> Mar 27, 2023  5:05 PM Ja-Kwan M wrote: Reason for CRM: Pt stated he was returning call because he had a missed call from the office.

## 2023-03-28 NOTE — Telephone Encounter (Signed)
Looked in pt chart and doesn't see where anyone has tried to contact him and pt doesn't have any upcoming appts at [facility]

## 2023-04-13 ENCOUNTER — Ambulatory Visit (INDEPENDENT_AMBULATORY_CARE_PROVIDER_SITE_OTHER): Payer: PRIVATE HEALTH INSURANCE | Admitting: Primary Care

## 2023-04-13 ENCOUNTER — Encounter (INDEPENDENT_AMBULATORY_CARE_PROVIDER_SITE_OTHER): Payer: Self-pay | Admitting: Primary Care

## 2023-04-13 DIAGNOSIS — F411 Generalized anxiety disorder: Secondary | ICD-10-CM | POA: Diagnosis not present

## 2023-04-13 DIAGNOSIS — F331 Major depressive disorder, recurrent, moderate: Secondary | ICD-10-CM | POA: Diagnosis not present

## 2023-04-13 MED ORDER — FLUOXETINE HCL 20 MG PO CAPS: 20.0000 mg | ORAL_CAPSULE | Freq: Every day | ORAL | 1 refills | Status: DC

## 2023-04-13 NOTE — Progress Notes (Signed)
Can't hear out of right ear. Has upcoming appointment for ears. Medication refill.

## 2023-04-15 NOTE — Progress Notes (Signed)
[facility]  Todd Vega, is a 62 y.o. male  ZOX:[id number]  [id number]  DOB - [birth year]  Chief Complaint  Patient presents with   Hearing Problem       Subjective:   Todd Vega is a 62 y.o. male here today for a follow up visit. Patient has No headache, No chest pain, No abdominal pain - No Nausea, No new weakness tingling or numbness, No Cough - shortness of breath  No problems updated.  Allergies  Allergen Reactions   Chocolate    Cocos Nucifera     Past Medical History:  Diagnosis Date   Asthma due to seasonal allergies     Current Outpatient Medications on File Prior to Visit  Medication Sig Dispense Refill   apixaban (ELIQUIS) 5 MG TABS tablet Take 1 tablet (5 mg total) by mouth 2 (two) times daily. MUST SCHEDULE APPT WITH CARDIOLOGIST FOR FUTURE REFILLS 180 tablet 0   aspirin 81 MG EC tablet Take 1 tablet (81 mg total) by mouth daily. Swallow whole. 30 tablet 0   atorvastatin (LIPITOR) 80 MG tablet Take 1 tablet (80 mg total) by mouth daily. 90 tablet 0   carvedilol (COREG) 25 MG tablet Take 1 tablet (25 mg total) by mouth 2 (two) times daily with a meal. 60 tablet 0   dapagliflozin propanediol ([name]) 10 MG TABS tablet Take 1 tablet (10 mg total) by mouth daily before breakfast. 90 tablet 2   ezetimibe (ZETIA) 10 MG tablet Take 1 tablet (10 mg total) by mouth daily. 30 tablet 0   fexofenadine (ALLEGRA) 180 MG tablet Take 180 mg by mouth as needed for allergies or rhinitis.     isosorbide-hydrALAZINE (BIDIL) 20-37.5 MG tablet Take 2 tablets by mouth 3 (three) times daily. 365 tablet 0   sacubitril-valsartan (ENTRESTO) 97-103 MG Take 1 tablet by mouth 2 (two) times daily. 180 tablet 0   spironolactone (ALDACTONE) 50 MG tablet Take 1 tablet (50 mg total) by mouth daily. Must keep scheduled appointment for future refills. Thank you. 90 tablet 0   triamcinolone cream (KENALOG) 0.1 % Apply 1 application. topically 2 (two) times daily. 45 g 0    No current facility-administered medications on file prior to visit.    Objective:   Vitals:   04/13/23 1148  BP: (Abnormal) 105/58  Pulse: 82  SpO2: 97%  Weight: 129 lb (58.5 kg)  Height: 5\' 5"  (1.651 m)    Comprehensive ROS Pertinent positive and negative noted in HPI   Exam General appearance : Awake, alert, not in any distress. Speech Clear. Not toxic looking HEENT: Atraumatic and Normocephalic, pupils equally reactive to light and accomodation Neck: Supple, no JVD. No cervical lymphadenopathy.  Chest: Good air entry bilaterally, no added sounds  CVS: S1 S2 regular, no murmurs.  Abdomen: Bowel sounds present, Non tender and not distended with no gaurding, rigidity or rebound. Extremities: B/L Lower Ext shows no edema, both legs are warm to touch Neurology: Awake alert, and oriented X 3, CN II-XII intact, Non focal Skin: No Rash  Data Review Lab Results  Component Value Date   HGBA1C 5.5 01/06/2022   HGBA1C 5.6 01/23/2021    Assessment & Plan  [name] was seen today for hearing problem.  Diagnoses and all orders for this visit:  Generalized anxiety disorder 2/2  Moderate episode of recurrent major depressive disorder (HCC) -     FLUoxetine (PROZAC) 20 MG capsule; Take 1 capsule (20 mg total) by mouth daily.    Patient  have been counseled extensively about nutrition and exercise. Other issues discussed during this visit include: low cholesterol diet, weight control and daily exercise, foot care, annual eye examinations at Ophthalmology, importance of adherence with medications and regular follow-up. We also discussed long term complications of uncontrolled diabetes and hypertension.   Return in about 6 months (around 10/13/2023) for medication refill .  The patient was given clear instructions to go to ER or return to medical center if symptoms don't improve, worsen or new problems develop. The patient verbalized understanding. The patient was told to call to get  lab results if they haven't heard anything in the next week.   This note has been created with Education officer, environmental. Any transcriptional errors are unintentional.   Todd Sessions, NP 04/15/2023, 11:32 PM

## 2023-04-18 ENCOUNTER — Other Ambulatory Visit: Payer: Self-pay | Admitting: Cardiovascular Disease

## 2023-04-18 DIAGNOSIS — I5040 Unspecified combined systolic (congestive) and diastolic (congestive) heart failure: Secondary | ICD-10-CM

## 2023-05-01 NOTE — Progress Notes (Unsigned)
Cardiology Office Note:  .   Date:  05/02/2023  ID:  [name], DOB 1961-07-17, MRN [id number] PCP: [name], NP  [facility] Providers Cardiologist:  [name], MD {  History of Present Illness: .   Todd Vega is a 62 y.o. male with past medical history of CVA, CHF here for follow-up appointment.  He was seen by Dr. Elease Hashimoto initially April 2022 for evaluation of CHF  Echo showed EF 20-25%, cardiac catheterization revealed severe CAD.  Echocardiogram revealed LV thrombus.  Was seen by Dr. Cornelius Moras and CABG was discussed.  He did not want CABG at that time.  Was seen by Dr. Elease Hashimoto May 2022 and he still did not want CABG.  Has been on Entresto, Coreg, spironolactone.  Has been on Eliquis for LV thrombus and history of CVA.  Compliant with Eliquis.  September 2022 LV thrombus had resolved.  Cardiac MRI showed viable myocardium with EF 36%.  No angina.  CABG offered but he did not want to pursue.  December 30, 2021 he was seen for follow-up.  On Entresto, hydralazine, spironolactone, and [place].  Taking Cialis occasionally and stopped his Imdur.  Restarted on BiDil 20/37.5.  Today, He no chest pain or sob. He is completely out of his medications. He has not taken his metoprolol or his BP pills and this is likely why is HR and BP are elevated today. He says it cannot get his medications until the end of the month because that is when he gets paid. We discussed patient assistance and the importance of being on his medications. He was seen in urgent care of a right ear infection and is on antibiotics, he has lost his hearing in this ear. Suggested following up with PCP regarding his ear.   Reports no shortness of breath nor dyspnea on exertion. Reports no chest pain, pressure, or tightness. No edema, orthopnea, PND. Reports no palpitations.   ROS: Pertinent ROS in HPI  Studies Reviewed: .       Cardiac MRI IMPRESSION: 1.  No late gadolinium enhancement, suggesting myocardium  is viable   2.  No LV thrombus seen   3. Moderate LV dilatation with moderate systolic dysfunction (EF 36%)   4. Severe LV hypertrophy measuring up to 18mm in basal septum (16mm in posterior wall). While wall thickness greater than 15mm meets criteria for hypertrophic cardiomyopathy, hypertensive heart disease can commonly cause wall thickness up to 20mm in African American patients. Given history of uncontrolled hypertension, suspect this more likely represents hypertensive heart disease   5.  Normal RV size and systolic function (EF 54%)   6. Possible RUL pulmonary nodule, poorly visualized. Recommend CT chest for further evaluation     Physical Exam:   VS:  BP (!) 160/80   Pulse (!) 116   Ht 5\' 5"  (1.651 m)   Wt 126 lb (57.2 kg)   SpO2 97%   BMI 20.97 kg/m    Wt Readings from Last 3 Encounters:  05/02/23 126 lb (57.2 kg)  04/13/23 129 lb (58.5 kg)  01/26/22 123 lb 9.6 oz (56.1 kg)    GEN: Well nourished, well developed in no acute distress NECK: No JVD; No carotid bruits CARDIAC: sinus tachycardia, no murmurs, rubs, gallops RESPIRATORY:  Clear to auscultation without rales, wheezing or rhonchi  ABDOMEN: Soft, non-tender, non-distended EXTREMITIES:  No edema; No deformity   ASSESSMENT AND PLAN: .   1.  CAD -He has been evaluated by [facility] and decided not  to have surgery -luckily no chest pain at this time  2.  Hypertension -BP elevated because he is not taking his medications, ran out and has to wait for his paycheck at the end of the month\ -will reach out to Lasandra Beech to see options for patient assistance with medications  3.  Chronic combined systolic and diastolic CHF -euvolemic on exam today -continue current medications, Entesto, coreg, zetia, Bidil, spirolactone, and Eliquis  4.  Hyperlipidemia -continue current medications -will get an updated lipid panel and LFTs today      Dispo: He can follow-up in 6 months or before then if  needed  Signed, Sharlene Dory, PA-C

## 2023-05-02 ENCOUNTER — Ambulatory Visit: Payer: PRIVATE HEALTH INSURANCE | Attending: Physician Assistant | Admitting: Physician Assistant

## 2023-05-02 ENCOUNTER — Encounter: Payer: Self-pay | Admitting: *Deleted

## 2023-05-02 ENCOUNTER — Telehealth (HOSPITAL_COMMUNITY): Payer: Self-pay | Admitting: Licensed Clinical Social Worker

## 2023-05-02 ENCOUNTER — Encounter: Payer: Self-pay | Admitting: Physician Assistant

## 2023-05-02 ENCOUNTER — Other Ambulatory Visit: Payer: Self-pay | Admitting: Physician Assistant

## 2023-05-02 VITALS — BP 160/80 | HR 116 | Ht 65.0 in | Wt 126.0 lb

## 2023-05-02 DIAGNOSIS — I2510 Atherosclerotic heart disease of native coronary artery without angina pectoris: Secondary | ICD-10-CM | POA: Diagnosis not present

## 2023-05-02 DIAGNOSIS — I5040 Unspecified combined systolic (congestive) and diastolic (congestive) heart failure: Secondary | ICD-10-CM | POA: Diagnosis not present

## 2023-05-02 DIAGNOSIS — I10 Essential (primary) hypertension: Secondary | ICD-10-CM | POA: Diagnosis not present

## 2023-05-02 DIAGNOSIS — E785 Hyperlipidemia, unspecified: Secondary | ICD-10-CM | POA: Diagnosis not present

## 2023-05-02 MED ORDER — ISOSORB DINITRATE-HYDRALAZINE 20-37.5 MG PO TABS: 2.0000 | ORAL_TABLET | Freq: Three times a day (TID) | ORAL | 0 refills | Status: DC

## 2023-05-02 MED ORDER — EZETIMIBE 10 MG PO TABS: 10.0000 mg | ORAL_TABLET | Freq: Every day | ORAL | 0 refills | Status: DC

## 2023-05-02 MED ORDER — CARVEDILOL 25 MG PO TABS: 25.0000 mg | ORAL_TABLET | Freq: Two times a day (BID) | ORAL | 0 refills | Status: DC

## 2023-05-02 NOTE — Patient Instructions (Signed)
Medication Instructions:   Your physician recommends that you continue on your current medications as directed. Please refer to the Current Medication list given to you today.   *If you need a refill on your cardiac medications before your next appointment, please call your pharmacy*   Lab Work: LIVER AND LIPIDS  TODAY   If you have labs (blood work) drawn today and your tests are completely normal, you will receive your results only by: MyChart Message (if you have MyChart) OR A paper copy in the mail If you have any lab test that is abnormal or we need to change your treatment, we will call you to review the results.   Testing/Procedures: NONE ORDERED  TODAY    Follow-Up: At [facility], you and your health needs are our priority.  As part of our continuing mission to provide you with exceptional heart care, we have created designated Provider Care Teams.  These Care Teams include your primary Cardiologist (physician) and Advanced Practice Providers (APPs -  Physician Assistants and Nurse Practitioners) who all work together to provide you with the care you need, when you need it.  We recommend signing up for the patient portal called "MyChart".  Sign up information is provided on this After Visit Summary.  MyChart is used to connect with patients for Virtual Visits (Telemedicine).  Patients are able to view lab/test results, encounter notes, upcoming appointments, etc.  Non-urgent messages can be sent to your provider as well.   To learn more about what you can do with MyChart, go to [email or web address].    Your next appointment:   6 month(s)  Provider:   [name], MD     Other Instructions  Low-Sodium Eating Plan Salt (sodium) helps you keep a healthy balance of fluids in your body. Too much sodium can raise your blood pressure. It can also cause fluid and waste to be held in your body. Your health care provider or dietitian may recommend a low-sodium eating  plan if you have high blood pressure (hypertension), kidney disease, liver disease, or heart failure. Eating less sodium can help lower your blood pressure and reduce swelling. It can also protect your heart, liver, and kidneys. What are tips for following this plan? Reading food labels  Check food labels for the amount of sodium per serving. If you eat more than one serving, you must multiply the listed amount by the number of servings. Choose foods with less than 140 milligrams (mg) of sodium per serving. Avoid foods with 300 mg of sodium or more per serving. Always check how much sodium is in a product, even if the label says "unsalted" or "no salt added." Shopping  Buy products labeled as "low-sodium" or "no salt added." Buy fresh foods. Avoid canned foods and pre-made or frozen meals. Avoid canned, cured, or processed meats. Buy breads that have less than 80 mg of sodium per slice. Cooking  Eat more home-cooked food. Try to eat less restaurant, buffet, and fast food. Try not to add salt when you cook. Use salt-free seasonings or herbs instead of table salt or sea salt. Check with your provider or pharmacist before using salt substitutes. Cook with plant-based oils, such as canola, sunflower, or olive oil. Meal planning When eating at a restaurant, ask if your food can be made with less salt or no salt. Avoid dishes labeled as brined, pickled, cured, or smoked. Avoid dishes made with soy sauce, miso, or teriyaki sauce. Avoid foods that have monosodium glutamate (  MSG) in them. MSG may be added to some restaurant food, sauces, soups, bouillon, and canned foods. Make meals that can be grilled, baked, poached, roasted, or steamed. These are often made with less sodium. General information Try to limit your sodium intake to 1,500-2,300 mg each day, or the amount told by your provider. What foods should I eat? Fruits Fresh, frozen, or canned fruit. Fruit juice. Vegetables Fresh or frozen  vegetables. "No salt added" canned vegetables. "No salt added" tomato sauce and paste. Low-sodium or reduced-sodium tomato and vegetable juice. Grains Low-sodium cereals, such as oats, puffed wheat and rice, and shredded wheat. Low-sodium crackers. Unsalted rice. Unsalted pasta. Low-sodium bread. Whole grain breads and whole grain pasta. Meats and other proteins Fresh or frozen meat, poultry, seafood, and fish. These should have no added salt. Low-sodium canned tuna and salmon. Unsalted nuts. Dried peas, beans, and lentils without added salt. Unsalted canned beans. Eggs. Unsalted nut butters. Dairy Milk. Soy milk. Cheese that is naturally low in sodium, such as ricotta cheese, fresh mozzarella, or Swiss cheese. Low-sodium or reduced-sodium cheese. Cream cheese. Yogurt. Seasonings and condiments Fresh and dried herbs and spices. Salt-free seasonings. Low-sodium mustard and ketchup. Sodium-free salad dressing. Sodium-free light mayonnaise. Fresh or refrigerated horseradish. Lemon juice. Vinegar. Other foods Homemade, reduced-sodium, or low-sodium soups. Unsalted popcorn and pretzels. Low-salt or salt-free chips. The items listed above may not be all the foods and drinks you can have. Talk to a dietitian to learn more. What foods should I avoid? Vegetables Sauerkraut, pickled vegetables, and relishes. Olives. [place] fries. Onion rings. Regular canned vegetables, except low-sodium or reduced-sodium items. Regular canned tomato sauce and paste. Regular tomato and vegetable juice. Frozen vegetables in sauces. Grains Instant hot cereals. Bread stuffing, pancake, and biscuit mixes. Croutons. Seasoned rice or pasta mixes. Noodle soup cups. Boxed or frozen macaroni and cheese. Regular salted crackers. Self-rising flour. Meats and other proteins Meat or fish that is salted, canned, smoked, spiced, or pickled. Precooked or cured meat, such as sausages or meat loaves. Todd Vega. Ham. Pepperoni. Hot dogs. Corned  beef. Chipped beef. Salt pork. Jerky. Pickled herring, anchovies, and sardines. Regular canned tuna. Salted nuts. Dairy Processed cheese and cheese spreads. Hard cheeses. Cheese curds. Blue cheese. Feta cheese. String cheese. Regular cottage cheese. Buttermilk. Canned milk. Fats and oils Salted butter. Regular margarine. Ghee. Bacon fat. Seasonings and condiments Onion salt, garlic salt, seasoned salt, table salt, and sea salt. Canned and packaged gravies. Worcestershire sauce. Tartar sauce. Barbecue sauce. Teriyaki sauce. Soy sauce, including reduced-sodium soy sauce. Steak sauce. Fish sauce. Oyster sauce. Cocktail sauce. Horseradish that you find on the shelf. Regular ketchup and mustard. Meat flavorings and tenderizers. Bouillon cubes. Hot sauce. Pre-made or packaged marinades. Pre-made or packaged taco seasonings. Relishes. Regular salad dressings. Salsa. Other foods Salted popcorn and pretzels. Corn chips and puffs. Potato and tortilla chips. Canned or dried soups. Pizza. Frozen entrees and pot pies. The items listed above may not be all the foods and drinks you should avoid. Talk to a dietitian to learn more. This information is not intended to replace advice given to you by your health care provider. Make sure you discuss any questions you have with your health care provider. Document Revised: 10/20/2022 Document Reviewed: 10/20/2022 Elsevier Patient Education  2024 Elsevier Inc.   Heart-Healthy Eating Plan Eating a healthy diet is important for the health of your heart. A heart-healthy eating plan includes: Eating less unhealthy fats. Eating more healthy fats. Eating less salt in your food. Salt  is also called sodium. Making other changes in your diet. Talk with your doctor or a diet specialist (dietitian) to create an eating plan that is right for you. What is my plan? Your doctor may recommend an eating plan that includes: Total fat: ______% or less of total calories a  day. Saturated fat: ______% or less of total calories a day. Cholesterol: less than _________mg a day. Sodium: less than _________mg a day. What are tips for following this plan? Cooking Avoid frying your food. Try to bake, boil, grill, or broil it instead. You can also reduce fat by: Removing the skin from poultry. Removing all visible fats from meats. Steaming vegetables in water or broth. Meal planning  At meals, divide your plate into four equal parts: Fill one-half of your plate with vegetables and green salads. Fill one-fourth of your plate with whole grains. Fill one-fourth of your plate with lean protein foods. Eat 2-4 cups of vegetables per day. One cup of vegetables is: 1 cup (91 g) broccoli or cauliflower florets. 2 medium carrots. 1 large bell pepper. 1 large sweet potato. 1 large tomato. 1 medium white potato. 2 cups (150 g) raw leafy greens. Eat 1-2 cups of fruit per day. One cup of fruit is: 1 small apple 1 large banana 1 cup (237 g) mixed fruit, 1 large orange,  cup (82 g) dried fruit, 1 cup (240 mL) 100% fruit juice. Eat more foods that have soluble fiber. These are apples, broccoli, carrots, beans, peas, and barley. Try to get 20-30 g of fiber per day. Eat 4-5 servings of nuts, legumes, and seeds per week: 1 serving of dried beans or legumes equals  cup (90 g) cooked. 1 serving of nuts is  oz (12 almonds, 24 pistachios, or 7 walnut halves). 1 serving of seeds equals  oz (8 g). General information Eat more home-cooked food. Eat less restaurant, buffet, and fast food. Limit or avoid alcohol. Limit foods that are high in starch and sugar. Avoid fried foods. Lose weight if you are overweight. Keep track of how much salt (sodium) you eat. This is important if you have high blood pressure. Ask your doctor to tell you more about this. Try to add vegetarian meals each week. Fats Choose healthy fats. These include olive oil and canola oil, flaxseeds,  walnuts, almonds, and seeds. Eat more omega-3 fats. These include salmon, mackerel, sardines, tuna, flaxseed oil, and ground flaxseeds. Try to eat fish at least 2 times each week. Check food labels. Avoid foods with trans fats or high amounts of saturated fat. Limit saturated fats. These are often found in animal products, such as meats, butter, and cream. These are also found in plant foods, such as palm oil, palm kernel oil, and coconut oil. Avoid foods with partially hydrogenated oils in them. These have trans fats. Examples are stick margarine, some tub margarines, cookies, crackers, and other baked goods. What foods should I eat? Fruits All fresh, canned (in natural juice), or frozen fruits. Vegetables Fresh or frozen vegetables (raw, steamed, roasted, or grilled). Green salads. Grains Most grains. Choose whole wheat and whole grains most of the time. Rice and pasta, including brown rice and pastas made with whole wheat. Meats and other proteins Lean, well-trimmed beef, veal, pork, and lamb. Chicken and [place] without skin. All fish and shellfish. Wild duck, rabbit, pheasant, and venison. Egg whites or low-cholesterol egg substitutes. Dried beans, peas, lentils, and tofu. Seeds and most nuts. Dairy Low-fat or nonfat cheeses, including ricotta and  mozzarella. Skim or 1% milk that is liquid, powdered, or evaporated. Buttermilk that is made with low-fat milk. Nonfat or low-fat yogurt. Fats and oils Non-hydrogenated (trans-free) margarines. Vegetable oils, including soybean, sesame, sunflower, olive, peanut, safflower, corn, canola, and cottonseed. Salad dressings or mayonnaise made with a vegetable oil. Beverages Mineral water. Coffee and tea. Diet carbonated beverages. Sweets and desserts Sherbet, gelatin, and fruit ice. Small amounts of dark chocolate. Limit all sweets and desserts. Seasonings and condiments All seasonings and condiments. The items listed above may not be a complete  list of foods and drinks you can eat. Contact a dietitian for more options. What foods should I avoid? Fruits Canned fruit in heavy syrup. Fruit in cream or butter sauce. Fried fruit. Limit coconut. Vegetables Vegetables cooked in cheese, cream, or butter sauce. Fried vegetables. Grains Breads that are made with saturated or trans fats, oils, or whole milk. Croissants. Sweet rolls. Donuts. High-fat crackers, such as cheese crackers. Meats and other proteins Fatty meats, such as hot dogs, ribs, sausage, bacon, rib-eye roast or steak. High-fat deli meats, such as salami and bologna. Caviar. Domestic duck and goose. Organ meats, such as liver. Dairy Cream, sour cream, cream cheese, and creamed cottage cheese. Whole-milk cheeses. Whole or 2% milk that is liquid, evaporated, or condensed. Whole buttermilk. Cream sauce or high-fat cheese sauce. Yogurt that is made from whole milk. Fats and oils Meat fat, or shortening. Cocoa butter, hydrogenated oils, palm oil, coconut oil, palm kernel oil. Solid fats and shortenings, including bacon fat, salt pork, lard, and butter. Nondairy cream substitutes. Salad dressings with cheese or sour cream. Beverages Regular sodas and juice drinks with added sugar. Sweets and desserts Frosting. Pudding. Cookies. Cakes. Pies. Milk chocolate or white chocolate. Buttered syrups. Full-fat ice cream or ice cream drinks. The items listed above may not be a complete list of foods and drinks to avoid. Contact a dietitian for more information. Summary Heart-healthy meal planning includes eating less unhealthy fats, eating more healthy fats, and making other changes in your diet. Eat a balanced diet. This includes fruits and vegetables, low-fat or nonfat dairy, lean protein, nuts and legumes, whole grains, and heart-healthy oils and fats. This information is not intended to replace advice given to you by your health care provider. Make sure you discuss any questions you have with  your health care provider. Document Revised: 11/08/2021 Document Reviewed: 11/08/2021 Elsevier Patient Education  2024 [employer].

## 2023-05-02 NOTE — Telephone Encounter (Signed)
CSW contcated patient to follow up on assistance with medications. Message left for return call. Lasandra Beech, LCSW, CCSW-MCS [phone]

## 2023-05-02 NOTE — Progress Notes (Signed)
H&V Care Navigation CSW Progress Note   Clinical Social Worker contacted patient by phone to assist with financial needs for medications.   Patient is participating in a Managed Medicaid Plan:  No   CSW received referral to assist with medications assistance. CSW was able to provide some financial assistance through the patient care fund for assistance. Lasandra Beech, LCSW, CCSW-MCS [phone]     SDOH Screenings    Food Insecurity: Food Insecurity Present (05/02/2023)  Housing: Low Risk  (05/02/2023)  Transportation Needs: No Transportation Needs (05/02/2023)  Utilities: Not At Risk (05/02/2023)  Alcohol Screen: Low Risk  (01/26/2021)  Depression (PHQ2-9): Low Risk  (04/13/2023)  Financial Resource Strain: High Risk (05/02/2023)  Tobacco Use: Low Risk  (05/02/2023)

## 2023-05-02 NOTE — Telephone Encounter (Deleted)
H&V Care Navigation CSW Progress Note  Clinical Social Worker contacted patient by phone to assist with financial needs for medications.  Patient is participating in a Managed Medicaid Plan:  No  CSW received referral to assist with medications assistance. CSW was able to provide some financial assistance through the patient care fund for assistance. Lasandra Beech, LCSW, CCSW-MCS [phone]   SDOH Screenings   Food Insecurity: Food Insecurity Present (05/02/2023)  Housing: Low Risk  (05/02/2023)  Transportation Needs: No Transportation Needs (05/02/2023)  Utilities: Not At Risk (05/02/2023)  Alcohol Screen: Low Risk  (01/26/2021)  Depression (PHQ2-9): Low Risk  (04/13/2023)  Financial Resource Strain: High Risk (05/02/2023)  Tobacco Use: Low Risk  (05/02/2023)

## 2023-05-03 LAB — LIPID PANEL
Chol/HDL Ratio: 2 ratio (ref 0.0–5.0)
Cholesterol, Total: 149 mg/dL (ref 100–199)
HDL: 76 mg/dL (ref >39)
LDL Chol Calc (NIH): 60 mg/dL (ref 0–99)
Triglycerides: 64 mg/dL (ref 0–149)
VLDL Cholesterol Cal: 13 mg/dL (ref 5–40)

## 2023-05-03 LAB — HEPATIC FUNCTION PANEL
ALT: 19 IU/L (ref 0–44)
AST: 25 IU/L (ref 0–40)
Albumin: 4 g/dL (ref 3.9–4.9)
Alkaline Phosphatase: 66 IU/L (ref 44–121)
Bilirubin Total: 0.5 mg/dL (ref 0.0–1.2)
Bilirubin, Direct: 0.15 mg/dL (ref 0.00–0.40)
Total Protein: 7.1 g/dL (ref 6.0–8.5)

## 2023-05-10 ENCOUNTER — Other Ambulatory Visit: Payer: Self-pay

## 2023-05-10 DIAGNOSIS — I5040 Unspecified combined systolic (congestive) and diastolic (congestive) heart failure: Secondary | ICD-10-CM

## 2023-05-10 MED ORDER — ENTRESTO 97-103 MG PO TABS
1.0000 | ORAL_TABLET | Freq: Two times a day (BID) | ORAL | 3 refills | Status: DC
Start: 2023-05-10 — End: 2024-04-12

## 2023-05-10 NOTE — Telephone Encounter (Signed)
Pt's medication was sent to pt's pharmacy as requested. Confirmation received.  °

## 2023-05-16 ENCOUNTER — Telehealth: Payer: Self-pay | Admitting: Cardiovascular Disease

## 2023-05-16 NOTE — Telephone Encounter (Signed)
Patient calling the office for samples of medication:   1.  What medication and dosage are you requesting samples for? Entresto  2.  Are you currently out of this medication?   No, patient runs out of medication tomorrow.

## 2023-05-16 NOTE — Telephone Encounter (Signed)
Left message for patient to call back  

## 2023-05-21 ENCOUNTER — Other Ambulatory Visit: Payer: Self-pay | Admitting: Cardiovascular Disease

## 2023-05-22 MED ORDER — ATORVASTATIN CALCIUM 80 MG PO TABS: 80.0000 mg | ORAL_TABLET | Freq: Every day | ORAL | 3 refills | Status: DC

## 2023-05-22 NOTE — Telephone Encounter (Signed)
Left message for patient to call back if still needing to inquire about Entresto samples.

## 2023-05-23 NOTE — Telephone Encounter (Signed)
Letter sent to patient with Todd Vega information, as well as ours, asking that if he still desires help with his medications, to return call to office. Closing encounter at this time.

## 2023-05-29 ENCOUNTER — Other Ambulatory Visit: Payer: Self-pay | Admitting: Physician Assistant

## 2023-06-08 ENCOUNTER — Telehealth: Payer: Self-pay | Admitting: Cardiovascular Disease

## 2023-06-08 NOTE — Telephone Encounter (Signed)
Called pt and left message informing him that we do not have samples of medication isosorbide-hydralazine and that pt needed to contact his pharmacy and/or his insurance company, if he is having problems getting this medication. I advised the pt that if he has any other problems, questions or concerns, to give our office a call.

## 2023-06-08 NOTE — Telephone Encounter (Signed)
Patient calling the office for samples of medication:   1.  What medication and dosage are you requesting samples for?  isosorbide-hydrALAZINE (BIDIL) 20-37.5 MG tablet   2.  Are you currently out of this medication?   Yes

## 2023-06-23 ENCOUNTER — Other Ambulatory Visit: Payer: Self-pay | Admitting: Cardiovascular Disease

## 2023-07-13 ENCOUNTER — Other Ambulatory Visit: Payer: Self-pay

## 2023-07-13 MED ORDER — ISOSORB DINITRATE-HYDRALAZINE 20-37.5 MG PO TABS: 2.0000 | ORAL_TABLET | Freq: Three times a day (TID) | ORAL | 3 refills | Status: DC

## 2023-07-13 NOTE — Telephone Encounter (Signed)
Pt's medication was sent to pt's pharmacy as requested. Confirmation received.  °

## 2023-08-03 ENCOUNTER — Telehealth: Payer: Self-pay | Admitting: Cardiovascular Disease

## 2023-08-03 MED ORDER — DAPAGLIFLOZIN PROPANEDIOL 10 MG PO TABS: 10.0000 mg | ORAL_TABLET | Freq: Every day | ORAL | 2 refills | Status: DC

## 2023-08-03 NOTE — Telephone Encounter (Signed)
Pt's medication was sent to pt's pharmacy as requested. Confirmation received.  °

## 2023-08-03 NOTE — Telephone Encounter (Signed)
*  STAT* If patient is at the pharmacy, call can be transferred to refill team.   1. Which medications need to be refilled? (please list name of each medication and dose if known)   dapagliflozin propanediol ([name]) 10 MG TABS tablet   2. Would you like to learn more about the convenience, safety, & potential cost savings by using the [facility]?   3. Are you open to using the [facility] (Type [facility]. ).  4. Which pharmacy/location (including street and city if local pharmacy) is medication to be sent to?  [facility] #[id number] - [place], [place] - [address]   5. Do they need a 30 day or 90 day supply?   90 day  Patient stated he still has some medication left.

## 2023-09-06 ENCOUNTER — Telehealth: Payer: Self-pay | Admitting: Cardiovascular Disease

## 2023-09-06 NOTE — Telephone Encounter (Signed)
The patient was notified that a refill for farxiga was sent to his pharmacy on 08/03/23 for 90 days with 2 refills.

## 2023-09-06 NOTE — Telephone Encounter (Signed)
*  STAT* If patient is at the pharmacy, call can be transferred to refill team.   1. Which medications need to be refilled? (please list name of each medication and dose if known)   dapagliflozin propanediol ([name]) 10 MG TABS tablet    2. Which pharmacy/location (including street and city if local pharmacy) is medication to be sent to?  [facility] #[id number] - , [place] - [address]    3. Do they need a 30 day or 90 day supply? 90

## 2023-09-18 ENCOUNTER — Telehealth (INDEPENDENT_AMBULATORY_CARE_PROVIDER_SITE_OTHER): Payer: Self-pay | Admitting: Primary Care

## 2023-09-18 NOTE — Telephone Encounter (Signed)
Called pt to reschedule apt because provider will not be in office on 1/2. Advised pt to call back if the apt schedule does not work for them.

## 2023-09-29 ENCOUNTER — Telehealth: Payer: Self-pay | Admitting: Cardiovascular Disease

## 2023-09-29 NOTE — Telephone Encounter (Signed)
Paper Work Dropped Off: [employer] Patient Assistance application   Date: 09/29/2023  Location of paper: Dr. Elease Hashimoto mail box  for completion

## 2023-10-04 ENCOUNTER — Telehealth: Payer: Self-pay | Admitting: Cardiovascular Disease

## 2023-10-04 MED ORDER — DAPAGLIFLOZIN PROPANEDIOL 10 MG PO TABS: 10.0000 mg | ORAL_TABLET | Freq: Every day | ORAL | 7 refills | Status: DC

## 2023-10-04 MED ORDER — EZETIMIBE 10 MG PO TABS: 10.0000 mg | ORAL_TABLET | Freq: Every day | ORAL | 1 refills | Status: DC

## 2023-10-04 MED ORDER — SPIRONOLACTONE 50 MG PO TABS: 50.0000 mg | ORAL_TABLET | Freq: Every day | ORAL | 1 refills | Status: DC

## 2023-10-04 NOTE — Telephone Encounter (Signed)
 *  STAT* If patient is at the pharmacy, call can be transferred to refill team.   1. Which medications need to be refilled? (please list name of each medication and dose if known)   dapagliflozin propanediol ([name]) 10 MG TABS tablet  spironolactone (ALDACTONE) 50 MG tablet  ezetimibe (ZETIA) 10 MG tablet   2. Which pharmacy/location (including street and city if local pharmacy) is medication to be sent to?  [facility] #[id number] - [place], [place] - [address]    3. Do they need a 30 day or 90 day supply? Spironalactone 90 days, farxiga 30 days

## 2023-10-05 NOTE — Telephone Encounter (Signed)
Patient assistance forms retrieved and signed by DOD Jacinto Halim) and faxed to [facility] w/confirmation received.

## 2023-10-06 ENCOUNTER — Telehealth: Payer: Self-pay

## 2023-10-06 ENCOUNTER — Other Ambulatory Visit: Payer: Self-pay

## 2023-10-06 ENCOUNTER — Other Ambulatory Visit (HOSPITAL_COMMUNITY): Payer: Self-pay

## 2023-10-06 MED ORDER — SPIRONOLACTONE 50 MG PO TABS
50.0000 mg | ORAL_TABLET | Freq: Every day | ORAL | 1 refills | Status: DC
  Filled 2023-10-06 (×2): qty 90, 90d supply, fill #0

## 2023-10-06 MED ORDER — EZETIMIBE 10 MG PO TABS
10.0000 mg | ORAL_TABLET | Freq: Every day | ORAL | 1 refills | Status: DC
  Filled 2023-10-06 (×2): qty 90, 90d supply, fill #0

## 2023-10-06 MED ORDER — DAPAGLIFLOZIN PROPANEDIOL 10 MG PO TABS
10.0000 mg | ORAL_TABLET | Freq: Every day | ORAL | 1 refills | Status: DC
  Filled 2023-10-06 (×2): qty 90, 90d supply, fill #0

## 2023-10-06 NOTE — Telephone Encounter (Signed)
 Patient Advocate Encounter   The patient was approved for a [employer] grant that will help cover the cost of ENTRESTO Total amount awarded, $10,000.  Effective: 07/16/23 - 07/14/24   [id number] [id number]   Haze Rushing, CPhT  Pharmacy Patient Advocate Specialist  Direct Number: [phone] Fax: [phone]

## 2023-10-06 NOTE — Telephone Encounter (Signed)
Documents received, thank you!

## 2023-10-06 NOTE — Telephone Encounter (Signed)
Patient wants his refill prescriptions for dapagliflozin propanediol ([name]) 10 MG TABS tablet, spironolactone (ALDACTONE) 50 MG tablet, ezetimibe (ZETIA) 10 MG tablet re-sent to [facility] #[id number] - [place], [place] - [address],

## 2023-10-09 ENCOUNTER — Other Ambulatory Visit (HOSPITAL_COMMUNITY): Payer: Self-pay

## 2023-10-09 NOTE — Telephone Encounter (Signed)
PAP: Application for [name] has been submitted to PAP Companies: [employer], via fax. If patient requests a status update in the meantime, please refer them to [employer] at [phone].

## 2023-10-10 ENCOUNTER — Other Ambulatory Visit (HOSPITAL_COMMUNITY): Payer: Self-pay

## 2023-10-10 NOTE — Telephone Encounter (Signed)
PAP: Patient has been denied for pt assistance by PAP Companies: [employer] due to Patient's who have commercial insurance are not eligible for NPAF support. Based on test claim, patient needs to pay his insurance premium.

## 2023-10-12 ENCOUNTER — Other Ambulatory Visit (INDEPENDENT_AMBULATORY_CARE_PROVIDER_SITE_OTHER): Payer: Self-pay | Admitting: Primary Care

## 2023-10-12 DIAGNOSIS — F411 Generalized anxiety disorder: Secondary | ICD-10-CM

## 2023-10-12 DIAGNOSIS — F331 Major depressive disorder, recurrent, moderate: Secondary | ICD-10-CM

## 2023-10-12 NOTE — Telephone Encounter (Signed)
Will forward to provider  

## 2023-10-12 NOTE — Telephone Encounter (Signed)
Called and spoke with patient to inform him that he is eligible for the $10 coupon card. Registered him for it on the website and sent him the information on MyChart, also called and provided him with all the information. No further questions.

## 2023-10-19 ENCOUNTER — Telehealth: Payer: Self-pay | Admitting: Cardiovascular Disease

## 2023-10-19 ENCOUNTER — Ambulatory Visit (INDEPENDENT_AMBULATORY_CARE_PROVIDER_SITE_OTHER): Payer: PRIVATE HEALTH INSURANCE | Admitting: Primary Care

## 2023-10-19 MED ORDER — DAPAGLIFLOZIN PROPANEDIOL 10 MG PO TABS: 10.0000 mg | ORAL_TABLET | Freq: Every day | ORAL | 1 refills | Status: DC

## 2023-10-19 MED ORDER — ISOSORB DINITRATE-HYDRALAZINE 20-37.5 MG PO TABS: 2.0000 | ORAL_TABLET | Freq: Three times a day (TID) | ORAL | 1 refills | Status: DC

## 2023-10-19 MED ORDER — CARVEDILOL 25 MG PO TABS: 25.0000 mg | ORAL_TABLET | Freq: Two times a day (BID) | ORAL | 1 refills | Status: DC

## 2023-10-19 NOTE — Telephone Encounter (Signed)
*  STAT* If patient is at the pharmacy, call can be transferred to refill team.   1. Which medications need to be refilled? (please list name of each medication and dose if known) isosorbide -hydrALAZINE  (BIDIL ) 20-37.5 MG tablet Take 2 tablets by mouth 3 (three) times daily.  dapagliflozin  propanediol ([name] ) 10 MG TABS tablet Take 1 tablet (10 mg total) by mouth daily before breakfast.  carvedilol  (COREG ) 25 MG tablet   TAKE 1 TABLET(25 MG) BY MOUTH TWICE DAILY WITH A MEAL   2. Would you like to learn more about the convenience, safety, & potential cost savings by using the [facility]? No   3. Are you open to using the [facility] No   4. Which pharmacy/location (including street and city if local pharmacy) is medication to be sent to? [facility] #[id number] - [place], [place] - [address]     5. Do they need a 30 day or 90 day supply?  30 Day Supply - dapagliflozin  propanediol ([name] ) 10 MG TABS tablet    90 Day Supply - isosorbide -hydrALAZINE  (BIDIL ) 20-37.5 MG tablet and carvedilol  (COREG ) 25 MG tablet

## 2023-10-23 ENCOUNTER — Ambulatory Visit (INDEPENDENT_AMBULATORY_CARE_PROVIDER_SITE_OTHER): Payer: PRIVATE HEALTH INSURANCE | Admitting: Primary Care

## 2023-10-24 ENCOUNTER — Other Ambulatory Visit: Payer: Self-pay | Admitting: Cardiovascular Disease

## 2023-11-02 ENCOUNTER — Ambulatory Visit (INDEPENDENT_AMBULATORY_CARE_PROVIDER_SITE_OTHER): Payer: No Typology Code available for payment source | Admitting: Primary Care

## 2023-11-02 ENCOUNTER — Encounter (INDEPENDENT_AMBULATORY_CARE_PROVIDER_SITE_OTHER): Payer: Self-pay | Admitting: Primary Care

## 2023-11-02 VITALS — BP 95/58 | HR 86 | Resp 16 | Ht 65.0 in | Wt 121.0 lb

## 2023-11-02 DIAGNOSIS — G4700 Insomnia, unspecified: Secondary | ICD-10-CM

## 2023-11-02 DIAGNOSIS — I2510 Atherosclerotic heart disease of native coronary artery without angina pectoris: Secondary | ICD-10-CM | POA: Diagnosis not present

## 2023-11-02 DIAGNOSIS — Z2821 Immunization not carried out because of patient refusal: Secondary | ICD-10-CM

## 2023-11-02 DIAGNOSIS — F411 Generalized anxiety disorder: Secondary | ICD-10-CM | POA: Diagnosis not present

## 2023-11-02 NOTE — Progress Notes (Signed)
[facility]  Todd Vega, is a 63 y.o. male  ZOX:[id number]  WJX:[id number]  DOB - 1961-02-15  Chief Complaint  Patient presents with   Anxiety    New medication added to pt chart which is Doxepin and there is a high drug interaction with fluoxetine  Per message for interaction: Fluoxetine may increase the plasma concentrations and pharmacologic effects of tricyclic antidepressants (TCAs). Additionally, additive serotonergic effects may occur during coadministration of fluoxetine and tricyclic antidepressants, and the risk of developing serotonin syndrome may be increased.       Subjective:   Todd Vega is a 63 y.o. male here today for a follow up visit. He is HOH right ear. Bp 95/58 managed by cardiology .  Patient has No headache, No chest pain, No abdominal pain - No Nausea, No new weakness tingling or numbness, No Cough - shortness of breath Anxiety Presents for follow-up visit. Symptoms include insomnia and irritability. Primary symptoms comment: [facility] prescribed doxepin 10 mg. Symptoms occur rarely. The severity of symptoms is mild and causing significant distress. The patient sleeps 8 hours per night. The quality of sleep is good. Nighttime awakenings: early a.m. for rest of night.   Compliance with medications is 76-100%. Treatment side effects: HOH of right ear.    No problems updated.  Comprehensive ROS Pertinent positive and negative noted in HPI   Allergies  Allergen Reactions   Chocolate    Coconut Oil Hives   Cocos Nucifera     Past Medical History:  Diagnosis Date   Asthma due to seasonal allergies     Current Outpatient Medications on File Prior to Visit  Medication Sig Dispense Refill   doxepin (SINEQUAN) 10 MG capsule Take 10 mg by mouth 2 (two) times daily.     apixaban (ELIQUIS) 5 MG TABS tablet Take 1 tablet (5 mg total) by mouth 2 (two) times daily. MUST SCHEDULE APPT WITH CARDIOLOGIST FOR FUTURE REFILLS 180 tablet 0    aspirin 81 MG EC tablet Take 1 tablet (81 mg total) by mouth daily. Swallow whole. 30 tablet 0   atorvastatin (LIPITOR) 80 MG tablet TAKE 1 TABLET(80 MG) BY MOUTH DAILY 30 tablet 0   carvedilol (COREG) 25 MG tablet Take 1 tablet (25 mg total) by mouth 2 (two) times daily with a meal. 180 tablet 1   dapagliflozin propanediol ([name]) 10 MG TABS tablet Take 1 tablet (10 mg total) by mouth daily before breakfast. 90 tablet 1   ezetimibe (ZETIA) 10 MG tablet Take 1 tablet (10 mg total) by mouth daily. 90 tablet 1   fexofenadine (ALLEGRA) 180 MG tablet Take 180 mg by mouth as needed for allergies or rhinitis.     FLUoxetine (PROZAC) 20 MG capsule TAKE 1 CAPSULE(20 MG) BY MOUTH DAILY 90 capsule 1   isosorbide-hydrALAZINE (BIDIL) 20-37.5 MG tablet Take 2 tablets by mouth 3 (three) times daily. 540 tablet 1   sacubitril-valsartan (ENTRESTO) 97-103 MG Take 1 tablet by mouth 2 (two) times daily. 180 tablet 3   spironolactone (ALDACTONE) 50 MG tablet Take 1 tablet (50 mg total) by mouth daily. 90 tablet 1   triamcinolone cream (KENALOG) 0.1 % Apply 1 application. topically 2 (two) times daily. 45 g 0   No current facility-administered medications on file prior to visit.   Health Maintenance  Topic Date Due   Hepatitis C Screening  Never done   Colon Cancer Screening  Never done   COVID-19 Vaccine (4 - 2024-25 season) 06/18/2023   Flu  Shot  01/15/2024*   Zoster (Shingles) Vaccine (1 of 2) 01/31/2024*   DTaP/Tdap/Td vaccine (1 - Tdap) 11/01/2024*   Pneumococcal Vaccination (1 of 2 - PCV) 11/01/2024*   HIV Screening  Completed   HPV Vaccine  Aged Out  *Topic was postponed. The date shown is not the original due date.    Objective:   Vitals:   11/02/23 1047  BP: (!) 95/58  Pulse: 86  Resp: 16  SpO2: 99%  Weight: 121 lb (54.9 kg)  Height: 5\' 5"  (1.651 m)    Physical Exam Vitals reviewed.  Constitutional:      Appearance: Normal appearance.     Comments: Bmi 20.14  HENT:     Head:  Normocephalic.     Comments: HOH right ear > left negative Webber and Rhine cerumen impaction      Right Ear: External ear normal. There is impacted cerumen.     Left Ear: External ear normal. There is impacted cerumen.     Nose: Nose normal.  Eyes:     Extraocular Movements: Extraocular movements intact.     Pupils: Pupils are equal, round, and reactive to light.  Cardiovascular:     Rate and Rhythm: Normal rate and regular rhythm.  Pulmonary:     Effort: Pulmonary effort is normal.     Breath sounds: Normal breath sounds.  Abdominal:     General: Abdomen is flat. Bowel sounds are normal.     Palpations: Abdomen is soft.  Musculoskeletal:        General: Normal range of motion.     Cervical back: Normal range of motion.  Skin:    General: Skin is warm and dry.  Neurological:     Mental Status: He is alert and oriented to person, place, and time.  Psychiatric:        Mood and Affect: Mood normal.        Behavior: Behavior normal.       Assessment & Plan  Todd Vega was seen today for anxiety.  Diagnoses and all orders for this visit:  Herpes zoster vaccination declined  Influenza vaccination declined  Pneumococcal vaccination declined  Tetanus, diphtheria, and acellular pertussis (Tdap) vaccination declined  Generalized anxiety disorder 2/2 Insomnia, unspecified type Prozac he feels is helps will continue   Coronary artery disease involving native coronary artery without angina pectoris, unspecified whether native or transplanted heart -     CMP14+EGFR -     Lipid Panel -     CBC with Differential        Patient have been counseled extensively about nutrition and exercise. Other issues discussed during this visit include: low cholesterol diet, weight control and daily exercise, foot care, annual eye examinations at Ophthalmology, importance of adherence with medications and regular follow-up. We also discussed long term complications of uncontrolled diabetes and  hypertension.  F/u  3months   The patient was given clear instructions to go to ER or return to medical center if symptoms don't improve, worsen or new problems develop. The patient verbalized understanding. The patient was told to call to get lab results if they haven't heard anything in the next week.   This note has been created with Education officer, environmental. Any transcriptional errors are unintentional.   [name], NP 11/06/2023, 7:27 PM

## 2023-11-03 LAB — CMP14+EGFR
ALT: 17 [IU]/L (ref 0–44)
AST: 24 [IU]/L (ref 0–40)
Albumin: 3.7 g/dL — ABNORMAL LOW (ref 3.9–4.9)
Alkaline Phosphatase: 74 [IU]/L (ref 44–121)
BUN/Creatinine Ratio: 18 (ref 10–24)
BUN: 18 mg/dL (ref 8–27)
Bilirubin Total: 0.4 mg/dL (ref 0.0–1.2)
CO2: 24 mmol/L (ref 20–29)
Calcium: 9.1 mg/dL (ref 8.6–10.2)
Chloride: 98 mmol/L (ref 96–106)
Creatinine, Ser: 1.02 mg/dL (ref 0.76–1.27)
Globulin, Total: 3 g/dL (ref 1.5–4.5)
Glucose: 157 mg/dL — ABNORMAL HIGH (ref 70–99)
Potassium: 3.9 mmol/L (ref 3.5–5.2)
Sodium: 137 mmol/L (ref 134–144)
Total Protein: 6.7 g/dL (ref 6.0–8.5)
eGFR: 83 mL/min/{1.73_m2} (ref >59)

## 2023-11-03 LAB — LIPID PANEL
Chol/HDL Ratio: 1.8 {ratio} (ref 0.0–5.0)
Cholesterol, Total: 129 mg/dL (ref 100–199)
HDL: 70 mg/dL (ref >39)
LDL Chol Calc (NIH): 47 mg/dL (ref 0–99)
Triglycerides: 52 mg/dL (ref 0–149)
VLDL Cholesterol Cal: 12 mg/dL (ref 5–40)

## 2023-11-03 LAB — CBC WITH DIFFERENTIAL/PLATELET
Basophils Absolute: 0 10*3/uL (ref 0.0–0.2)
Basos: 1 %
EOS (ABSOLUTE): 0.1 10*3/uL (ref 0.0–0.4)
Eos: 2 %
Hematocrit: 32.6 % — ABNORMAL LOW (ref 37.5–51.0)
Hemoglobin: 10.2 g/dL — ABNORMAL LOW (ref 13.0–17.7)
Immature Grans (Abs): 0 10*3/uL (ref 0.0–0.1)
Immature Granulocytes: 0 %
Lymphocytes Absolute: 0.7 10*3/uL (ref 0.7–3.1)
Lymphs: 17 %
MCH: 24.7 pg — ABNORMAL LOW (ref 26.6–33.0)
MCHC: 31.3 g/dL — ABNORMAL LOW (ref 31.5–35.7)
MCV: 79 fL (ref 79–97)
Monocytes Absolute: 0.5 10*3/uL (ref 0.1–0.9)
Monocytes: 12 %
Neutrophils Absolute: 2.9 10*3/uL (ref 1.4–7.0)
Neutrophils: 68 %
Platelets: 322 10*3/uL (ref 150–450)
RBC: 4.13 x10E6/uL — ABNORMAL LOW (ref 4.14–5.80)
RDW: 15.4 % (ref 11.6–15.4)
WBC: 4.3 10*3/uL (ref 3.4–10.8)

## 2023-11-06 ENCOUNTER — Encounter (INDEPENDENT_AMBULATORY_CARE_PROVIDER_SITE_OTHER): Payer: Self-pay | Admitting: Primary Care

## 2023-11-17 ENCOUNTER — Ambulatory Visit (INDEPENDENT_AMBULATORY_CARE_PROVIDER_SITE_OTHER): Payer: No Typology Code available for payment source

## 2023-11-17 DIAGNOSIS — H938X1 Other specified disorders of right ear: Secondary | ICD-10-CM

## 2023-11-17 DIAGNOSIS — H6121 Impacted cerumen, right ear: Secondary | ICD-10-CM

## 2023-11-17 NOTE — Progress Notes (Signed)
Patient is here today for Right ear irrigation. Right ear irrigation performed and patient tolerated well.

## 2023-12-07 ENCOUNTER — Telehealth (HOSPITAL_COMMUNITY): Payer: Self-pay | Admitting: Licensed Clinical Social Worker

## 2023-12-07 ENCOUNTER — Other Ambulatory Visit (HOSPITAL_COMMUNITY): Payer: Self-pay

## 2023-12-07 ENCOUNTER — Telehealth: Payer: Self-pay

## 2023-12-07 NOTE — Telephone Encounter (Signed)
Patient called and left a message for CSW to return call to assist with [name] and [place] assistance. CSW returned call and unable to reach patient.   CSW reached out to Pharmacy Advocate Haze Rushing who checked and patient has a co pay card on file for [facility] and cost should be $20. She will assist with co pay card for [name] and reach out to pharmacy. CSW will follow up with patient to inform of findings from Pharmacy advocate.   CSW will await return call from patient. Lasandra Beech, LCSW, CCSW-MCS [phone]

## 2023-12-07 NOTE — Telephone Encounter (Signed)
Patient Advocate Encounter Patient reached out to LCSW ([email or web address]) about med assistance for [place]. Patient is commercially insured and was already enrolled in a copay card for Entresto back in December 2024 (see previous encounter). Per Entresto test claim his copay is $20 before the coupon is applied. Enrolled patient in [place] copay card today. Ensured pharmacy has both cards on file [name] is filled and ready for $10 copay. [name] was filled in January for 90 DS for $60 so too soon to fill but they did put copay card on file for next time he needs a refill.   The patient was approved for a COPAY CARD that will help cover the cost of [name]   ZOX:[id number] PCN:[id number] Group:[id number]  WU:[id number]   Pharmacy provided with approval and processing information. Patient informed via TELEPHONE  Haze Rushing, CPhT  Pharmacy Patient Advocate Specialist  Direct Number: [phone] Fax: [phone]

## 2023-12-08 ENCOUNTER — Other Ambulatory Visit (HOSPITAL_COMMUNITY): Payer: Self-pay

## 2023-12-08 ENCOUNTER — Telehealth: Payer: Self-pay

## 2023-12-08 ENCOUNTER — Telehealth (HOSPITAL_COMMUNITY): Payer: Self-pay | Admitting: Licensed Clinical Social Worker

## 2023-12-08 NOTE — Telephone Encounter (Signed)
Patient returned call and CSW was able to share that the Pharmacy Advocate provided needed coupons and pricing regarding medications. Patient grateful for the assistance and will go to pharmacy to pick up his medications. CSW encouraged patient to contact providers office for any further questions regarding medications and provided contact information. Patient denies any other concerns at this time. Lasandra Beech, LCSW, CCSW-MCS [phone]

## 2023-12-08 NOTE — Telephone Encounter (Signed)
Patient is requesting a refill. Please send in prescription for [facility] to [facility] #[id number] [place] Otto, [place] - [address] AT [address]  [address], [place] [address]

## 2023-12-11 ENCOUNTER — Other Ambulatory Visit: Payer: Self-pay

## 2023-12-11 DIAGNOSIS — I513 Intracardiac thrombosis, not elsewhere classified: Secondary | ICD-10-CM

## 2023-12-11 MED ORDER — APIXABAN 5 MG PO TABS: 5.0000 mg | ORAL_TABLET | Freq: Two times a day (BID) | ORAL | 0 refills | Status: DC

## 2023-12-11 NOTE — Telephone Encounter (Signed)
 Prescription refill request for Eliquis received. Indication:cva Last office visit:upcoming Scr:1.02  1/25 Age: 63 Weight:54.9  kg  Prescription refilled

## 2023-12-11 NOTE — Telephone Encounter (Signed)
 Jorja Loa, [employer], please send any request for anticoagulant medications to  the cv div anticoagulation pool. Thanks

## 2024-01-25 ENCOUNTER — Encounter: Payer: Self-pay | Admitting: Cardiovascular Disease

## 2024-01-25 NOTE — Progress Notes (Unsigned)
 Cardiology Office Note:    Date:  01/26/2024   ID:  Todd Vega, DOB 07-02-1961, MRN [id number]  PCP:  [name], NP   [facility] Providers Cardiologist:  [name], MD {    Referring MD: [name], NP   Chief Complaint  Patient presents with   Coronary Artery Disease        Congestive Heart Failure         May 03, 2021   Todd Vega is a 63 y.o. male with a hx of CVA , CHF Seen with Carollee Herter ( niece )  I met him in April 2022 for eval of his CHF Echo showed EF 20-25%. Cath revealed severe CAD  Echo revealed LV thrombus Was seen by Dr. Cornelius Moras and CABG was discussed He did not want CABG at that time I saw him in May, 2022 and he still did not want to consider CABG. 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 23, 2022: [name] is seen today for follow up visit , here with Todd Vega ( sister)  Hx of CVA, CAD, CHF His LV thrombus has resolved. Cardiac MRI showed viable myocardium with EF 36%. He has not had any angina. He has been seen by Dr. Orson Aloe who offered CABG . Mr. Hilleary did not want to pursue CABG, He is here for further discussion Is back working  ( is a Investment banker, operational)  No CP , no dyspnea    December 30, 2021: Todd Vega is seen today for follow up of his coronary artery disease, CHF, CVA.  He is here alone. He has a history of LV thrombus which had resolved by previous studies.  Continue eliquis   Cardiac MRI showed viable myocardium with an ejection fraction of 36%. He has been considered for CABG and has seen Dr. Orson Aloe. He decided not to have coronary artery bypass grafting at that time.  Hx of CHF On Entresto 97-103 BID Hydralazine 25 TID Spironolactone 50 mg a day  [name]  10 QD   Has been taking  Cialis occasionally  - he has stopped the imdur - no cp after taking  Says it doesn't work very well  Will DC cialis and  Restart Bidil 20/37.5  - 2 tabs TID   Works as a Investment banker, operational at [employer] ( Rehab )  Avoid salt ,   Walks around the block and works out with [employer] on occasion   January 26, 2024 Todd Vega is seen for follow up of his CAD , CHF He was last seen 2 years ago  He has severe CAD with associated CHF.  He has refused CABG BP is elevated. Ate some salt this morning   Generally has a poor appetite   He says that he is taking his meds.     Past Medical History:  Diagnosis Date   Asthma due to seasonal allergies     Past Surgical History:  Procedure Laterality Date   BUBBLE STUDY  01/25/2021   Procedure: BUBBLE STUDY;  Surgeon: Jodelle Red, MD;  Location: [facility] ENDOSCOPY;  Service: Cardiovascular;;   RIGHT HEART CATH AND CORONARY ANGIOGRAPHY N/A 01/27/2021   Procedure: RIGHT HEART CATH AND  CORONARY ANGIOGRAPHY;  Surgeon: Lyn Records, MD;  Location: [facility] INVASIVE CV LAB;  Service: Cardiovascular;  Laterality: N/A;   TEE WITHOUT CARDIOVERSION N/A 01/25/2021   Procedure: TRANSESOPHAGEAL ECHOCARDIOGRAM (TEE);  Surgeon: Jodelle Red, MD;  Location: [facility] ENDOSCOPY;  Service: Cardiovascular;  Laterality: N/A;    Current Medications: Current Meds  Medication Sig   apixaban (ELIQUIS) 5 MG TABS tablet Take 1 tablet (5 mg total) by mouth 2 (two) times daily.   aspirin 81 MG EC tablet Take 1 tablet (81 mg total) by mouth daily. Swallow whole.   atorvastatin (LIPITOR) 80 MG tablet TAKE 1 TABLET(80 MG) BY MOUTH DAILY   carvedilol (COREG) 25 MG tablet Take 1 tablet (25 mg total) by mouth 2 (two) times daily with a meal.   dapagliflozin propanediol ([name]) 10 MG TABS tablet Take 1 tablet (10 mg total) by mouth daily before breakfast.   doxepin (SINEQUAN) 10 MG capsule Take 10 mg by mouth 2 (two) times daily.   ezetimibe (ZETIA) 10 MG tablet Take 1 tablet  (10 mg total) by mouth daily.   fexofenadine (ALLEGRA) 180 MG tablet Take 180 mg by mouth as needed for allergies or rhinitis.   FLUoxetine (PROZAC) 20 MG capsule TAKE 1 CAPSULE(20 MG) BY MOUTH DAILY   isosorbide-hydrALAZINE (BIDIL) 20-37.5 MG tablet Take 2 tablets by mouth 3 (three) times daily.   sacubitril-valsartan (ENTRESTO) 97-103 MG Take 1 tablet by mouth 2 (two) times daily.   spironolactone (ALDACTONE) 50 MG tablet Take 1 tablet (50 mg total) by mouth daily.     Allergies:   Chocolate, Coconut oil, and Cocos nucifera   Social History   Socioeconomic History   Marital status: Divorced    Spouse name: Not on file   Number of children: 0   Years of education: Not on file   Highest education level: Some college, no degree  Occupational History   Not on file  Tobacco Use   Smoking status: Never   Smokeless tobacco: Never  Vaping Use   Vaping status: Never Used  Substance and Sexual Activity   Alcohol use: Yes    Alcohol/week: 6.0 standard drinks of alcohol    Types: 6 Cans of beer per week   Drug use: Never   Sexual activity: Not on file  Other Topics Concern   Not on file  Social History Narrative   Not on file   Social Drivers of Health   Financial Resource Strain: High Risk (05/02/2023)   Overall Financial Resource Strain (CARDIA)    Difficulty of Paying Living Expenses: Hard  Food Insecurity: No Food Insecurity (11/02/2023)   Hunger Vital Sign    Worried About Running Out of Food in the Last Year: Never true    Ran Out of Food in the Last Year: Never true  Transportation Needs: No Transportation Needs (11/02/2023)   PRAPARE - Administrator, Civil Service (Medical): No    Lack of Transportation (Non-Medical): No  Physical Activity: Not on file  Stress: Not on file  Social Connections: Not on file     Family History: The patient's family history is not on file.  ROS:   Please see the history of present illness.     All other systems reviewed  and are negative.  EKGs/Labs/Other Studies Reviewed:    The following studies were reviewed today:    EKG:        Recent Labs: 11/02/2023: ALT 17; BUN 18; Creatinine, Ser 1.02; Hemoglobin 10.2; Platelets 322;  Potassium 3.9; Sodium 137  Recent Lipid Panel    Component Value Date/Time   CHOL 129 11/02/2023 1138   TRIG 52 11/02/2023 1138   HDL 70 11/02/2023 1138   CHOLHDL 1.8 11/02/2023 1138   CHOLHDL 2.2 01/24/2021 0228   VLDL 14 01/24/2021 0228   LDLCALC 47 11/02/2023 1138     Risk Assessment/Calculations:           Physical Exam:     Physical Exam: Blood pressure (!) 158/95, pulse 79, height 5\' 5"  (1.651 m), weight 129 lb 12.8 oz (58.9 kg), SpO2 96%.  HYPERTENSION CONTROL Vitals:   01/26/24 0903 01/26/24 0921  BP: (!) 174/86 (!) 158/95    The patient's blood pressure is elevated above target today.  In order to address the patient's elevated BP: Blood pressure will be monitored at home to determine if medication changes need to be made.       GEN:  thin, middle age man .  in no acute distress HEENT: Normal NECK: No JVD; No carotid bruits LYMPHATICS: No lymphadenopathy CARDIAC: RRR , no murmurs, rubs, gallops RESPIRATORY:  Clear to auscultation without rales, wheezing or rhonchi  ABDOMEN: Soft, non-tender, non-distended MUSCULOSKELETAL:  No edema; No deformity  SKIN: Warm and dry NEUROLOGIC:  Alert and oriented x 3    ASSESSMENT:    1. Essential hypertension   2. Coronary artery disease involving native coronary artery of native heart without angina pectoris   3. Hyperlipidemia LDL goal <70      PLAN:       CAD : He denies any angina.  He has severe three-vessel coronary artery disease but has not wanted to have any surgery.     2.  Acute on chronic combined systolic and diastolic congestive heart failure.   Continue current medications.  He is currently on Entresto 97-103 twice a day, BiDil 20-37.5, 2 tablets 3 times a day, carvedilol 25 mg  twice a day I questioned him several times about whether or not he is taking all of his medications.  His blood pressure remains high despite being on max dose of multiple medications.    3.  Hyperlipidemia:  LDL is 47 ( Jan. 2025)  continue current medicatinos    4.  Left ventricular thrombus: Continue Entresto.   Medication Adjustments/Labs and Tests Ordered: Current medicines are reviewed at length with the patient today.  Concerns regarding medicines are outlined above.  Orders Placed This Encounter  Procedures   AMB Referral to [facility] Pharm-D    No orders of the defined types were placed in this encounter.     Patient Instructions  Follow-Up: At [facility], you and your health needs are our priority.  As part of our continuing mission to provide you with exceptional heart care, our providers are all part of one team.  This team includes your primary Cardiologist (physician) and Advanced Practice Providers or APPs (Physician Assistants and Nurse Practitioners) who all work together to provide you with the care you need, when you need it.  Your next appointment:   3 month(s)  Provider:   [name], MD        1st Floor: - Lobby - Registration  - Pharmacy  - Lab - Cafe  2nd Floor: - PV Lab - Diagnostic Testing (echo, CT, nuclear med)  3rd Floor: - Vacant  4th Floor: - TCTS (cardiothoracic surgery) - AFib Clinic - Structural Heart Clinic - Vascular Surgery  - Vascular Ultrasound  5th Floor: -  HeartCare Cardiology (general and EP) - Clinical Pharmacy for coumadin, hypertension, lipid, weight-loss medications, and med management appointments    Valet parking services will be available as well.     Signed, [name], MD  01/26/2024 9:30 AM    [facility]

## 2024-01-26 ENCOUNTER — Encounter: Payer: Self-pay | Admitting: Cardiovascular Disease

## 2024-01-26 ENCOUNTER — Ambulatory Visit: Payer: No Typology Code available for payment source | Attending: Cardiology | Admitting: Cardiovascular Disease

## 2024-01-26 VITALS — BP 158/95 | HR 79 | Ht 65.0 in | Wt 129.8 lb

## 2024-01-26 DIAGNOSIS — I10 Essential (primary) hypertension: Secondary | ICD-10-CM

## 2024-01-26 DIAGNOSIS — E785 Hyperlipidemia, unspecified: Secondary | ICD-10-CM

## 2024-01-26 DIAGNOSIS — I2510 Atherosclerotic heart disease of native coronary artery without angina pectoris: Secondary | ICD-10-CM

## 2024-01-26 NOTE — Patient Instructions (Signed)
 Follow-Up: At [facility], you and your health needs are our priority.  As part of our continuing mission to provide you with exceptional heart care, our providers are all part of one team.  This team includes your primary Cardiologist (physician) and Advanced Practice Providers or APPs (Physician Assistants and Nurse Practitioners) who all work together to provide you with the care you need, when you need it.  Your next appointment:   3 month(s)  Provider:   [name], MD        1st Floor: - Lobby - Registration  - Pharmacy  - Lab - Cafe  2nd Floor: - PV Lab - Diagnostic Testing (echo, CT, nuclear med)  3rd Floor: - Vacant  4th Floor: - TCTS (cardiothoracic surgery) - AFib Clinic - Structural Heart Clinic - Vascular Surgery  - Vascular Ultrasound  5th Floor: - HeartCare Cardiology (general and EP) - Clinical Pharmacy for coumadin, hypertension, lipid, weight-loss medications, and med management appointments    Valet parking services will be available as well.

## 2024-02-01 ENCOUNTER — Telehealth: Payer: Self-pay | Admitting: Cardiovascular Disease

## 2024-02-01 NOTE — Telephone Encounter (Signed)
 Spoke with Sasha and she was calling because she thought patient had a copay card for farxiga.   I gave Dorthula Gavel the information for copay card.  She ran claim and being that patient has medicaid she states the card is not working

## 2024-03-01 ENCOUNTER — Other Ambulatory Visit (HOSPITAL_COMMUNITY): Payer: Self-pay

## 2024-03-07 ENCOUNTER — Other Ambulatory Visit: Payer: Self-pay | Admitting: Cardiovascular Disease

## 2024-03-07 DIAGNOSIS — I513 Intracardiac thrombosis, not elsewhere classified: Secondary | ICD-10-CM

## 2024-03-07 NOTE — Telephone Encounter (Signed)
 Prescription refill request for Eliquis  received.  Last office visit: Todd Vega, 01/26/2024 Scr: 1.02, 11/02/2023 Age: 63 yo  Weight: 58.9 kg   Refill sent.

## 2024-03-22 ENCOUNTER — Telehealth: Payer: Self-pay | Admitting: Cardiovascular Disease

## 2024-03-22 MED ORDER — DAPAGLIFLOZIN PROPANEDIOL 10 MG PO TABS: 10.0000 mg | ORAL_TABLET | Freq: Every day | ORAL | 3 refills | Status: AC

## 2024-03-22 NOTE — Telephone Encounter (Signed)
*  STAT* If patient is at the pharmacy, call can be transferred to refill team.   1. Which medications need to be refilled? (please list name of each medication and dose if known) dapagliflozin  propanediol ([name] ) 10 MG TABS tablet   2. Which pharmacy/location (including street and city if local pharmacy) is medication to be sent to? [facility] #[id number] [place] Nestle, [place] - [address] AT [address] Phone: [phone]  Fax: [phone]     3. Do they need a 30 day or 90 day supply? 90

## 2024-03-22 NOTE — Telephone Encounter (Signed)
 RX sent to requested Pharmacy

## 2024-04-03 NOTE — Progress Notes (Deleted)
 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 male patient of Dr Passe, in the office today for hypertension evaluation.   Blood Pressure Goal:  130/80  Current Medications: Entresto  97/103, spironolactone  50 mg every day, Bidil  20/37.5 - 2 tid, carvedilol  25 mg bid  Last fills: Entresto  90 d 12/08/23  Spirono 90 d 01/22/24  Bidil  30d 03/13/24  Carvedilol  01/23/24  Previously tried:    Family Hx:     Social Hx:      Tobacco:  Alcohol:  Caffeine: Diet:      Exercise:   Home BP readings:      Adherence Assessment  Do you ever forget to take your medication? [] Yes [] No  Do you ever skip doses due to side effects? [] Yes [] No  Do you have trouble affording your medicines? [] Yes [] No  Are you ever unable to pick up your medication due to transportation difficulties? [] Yes [] No  Do you ever stop taking your medications because you don't believe they are helping? [] Yes [] No  Do you check your weight daily? [] Yes [] No   Adherence strategy: ***  Barriers to obtaining medications: ***     Accessory Clinical Findings    Lab Results  Component Value Date   CREATININE 1.02 11/02/2023   BUN 18 11/02/2023   NA 137 11/02/2023   K 3.9 11/02/2023   CL 98 11/02/2023   CO2 24 11/02/2023   Lab Results  Component Value Date   ALT 17 11/02/2023   AST 24 11/02/2023   ALKPHOS 74 11/02/2023   BILITOT 0.4 11/02/2023   Lab Results  Component Value Date   HGBA1C 5.5 01/06/2022    Home  Medications    Current Outpatient Medications  Medication Sig Dispense Refill   aspirin  81 MG EC tablet Take 1 tablet (81 mg total) by mouth daily. Swallow whole. 30 tablet 0   atorvastatin  (LIPITOR) 80 MG tablet TAKE 1 TABLET(80 MG) BY MOUTH DAILY 30 tablet 0   carvedilol  (COREG ) 25 MG tablet Take 1 tablet (25 mg total) by mouth 2 (two) times daily with a meal. 180 tablet 1   dapagliflozin  propanediol ([name] ) 10 MG TABS tablet Take 1 tablet (10 mg total) by mouth daily before breakfast. 90 tablet 3   doxepin (SINEQUAN) 10 MG capsule Take 10 mg by mouth 2 (two) times daily.     ELIQUIS  5 MG TABS tablet TAKE 1 TABLET(5 MG) BY MOUTH TWICE DAILY 180 tablet 0   ezetimibe  (ZETIA ) 10 MG tablet Take 1 tablet (10 mg total) by mouth daily. 90 tablet 1   fexofenadine (ALLEGRA) 180 MG tablet Take 180 mg by mouth as needed for allergies or rhinitis.     FLUoxetine  (PROZAC ) 20 MG capsule TAKE 1 CAPSULE(20 MG) BY MOUTH DAILY 90 capsule 1   isosorbide -hydrALAZINE  (BIDIL ) 20-37.5 MG tablet Take 2 tablets by mouth 3 (three) times daily. 540 tablet 1  sacubitril -valsartan  (ENTRESTO ) 97-103 MG Take 1 tablet by mouth 2 (two) times daily. 180 tablet 3   spironolactone  (ALDACTONE ) 50 MG tablet Take 1 tablet (50 mg total) by mouth daily. 90 tablet 1   No current facility-administered medications for this visit.     No BP recorded.  {Refresh Note OR Click here to enter BP  :1}***   Assessment & Plan    No problem-specific Assessment & Plan notes found for this encounter.   Bellina Tokarczyk PharmD CPP [facility]  [address] [place], [place] [address] [phone]

## 2024-04-04 ENCOUNTER — Ambulatory Visit: Admitting: Pharmacist Clinician (PhC)/ Clinical Pharmacy Specialist

## 2024-04-12 ENCOUNTER — Telehealth: Payer: Self-pay | Admitting: Cardiovascular Disease

## 2024-04-12 DIAGNOSIS — I5040 Unspecified combined systolic (congestive) and diastolic (congestive) heart failure: Secondary | ICD-10-CM

## 2024-04-12 MED ORDER — ENTRESTO 97-103 MG PO TABS: 1.0000 | ORAL_TABLET | Freq: Two times a day (BID) | ORAL | 2 refills | Status: DC

## 2024-04-12 NOTE — Telephone Encounter (Signed)
 Pt's medication was sent to pt's pharmacy as requested. Confirmation received.

## 2024-04-12 NOTE — Telephone Encounter (Signed)
*  STAT* If patient is at the pharmacy, call can be transferred to refill team.   1. Which medications need to be refilled? (please list name of each medication and dose if known)   sacubitril -valsartan  (ENTRESTO ) 97-103 MG     4. Which pharmacy/location (including street and city if local pharmacy) is medication to be sent to?  [facility] #[id number] - [place], [place] - [address]     5. Do they need a 30 day or 90 day supply? 90

## 2024-04-30 NOTE — Progress Notes (Deleted)
  Cardiology Office Note    Patient Name: Todd Vega Date of Encounter: 04/30/2024  Primary Care Provider:  Celestia Rosaline SQUIBB, NP Primary Cardiologist:  Aleene Passe, MD Primary Electrophysiologist: None   Past Medical History    Past Medical History:  Diagnosis Date   Asthma due to seasonal allergies     History of Present Illness  Roxy Filler is a 63 y.o. male with a PMH of***    Patient denies chest pain, palpitations, dyspnea, PND, orthopnea, nausea, vomiting, dizziness, syncope, edema, weight gain, or early satiety.   Discussed the use of AI scribe software for clinical note transcription with the patient, who gave verbal consent to proceed.  History of Present Illness    ***Notes:   Review of Systems  Please see the history of present illness.    All other systems reviewed and are otherwise negative except as noted above.  Physical Exam    Wt Readings from Last 3 Encounters:  01/26/24 129 lb 12.8 oz (58.9 kg)  11/02/23 121 lb (54.9 kg)  05/02/23 126 lb (57.2 kg)   CD:Uyzmz were no vitals filed for this visit.,There is no height or weight on file to calculate BMI. GEN: Well nourished, well developed in no acute distress Neck: No JVD; No carotid bruits Pulmonary: Clear to auscultation without rales, wheezing or rhonchi  Cardiovascular: Normal rate. Regular rhythm. Normal S1. Normal S2.   Murmurs: There is no murmur.  ABDOMEN: Soft, non-tender, non-distended EXTREMITIES:  No edema; No deformity   EKG/LABS/ Recent Cardiac Studies   ECG personally reviewed by me today - ***  Risk Assessment/Calculations:   {Does this patient have ATRIAL FIBRILLATION?:[id number]}      Lab Results  Component Value Date   WBC 4.3 11/02/2023   HGB 10.2 (L) 11/02/2023   HCT 32.6 (L) 11/02/2023   MCV 79 11/02/2023   PLT 322 11/02/2023   Lab Results  Component Value Date   CREATININE 1.02 11/02/2023   BUN 18 11/02/2023   NA 137 11/02/2023   K 3.9 11/02/2023    CL 98 11/02/2023   CO2 24 11/02/2023   Lab Results  Component Value Date   CHOL 129 11/02/2023   HDL 70 11/02/2023   LDLCALC 47 11/02/2023   TRIG 52 11/02/2023   CHOLHDL 1.8 11/02/2023    Lab Results  Component Value Date   HGBA1C 5.5 01/06/2022   Assessment & Plan    Assessment and Plan Assessment & Plan     1.***  2.***  3.***  4.***      Disposition: Follow-up with Aleene Passe, MD or APP in *** months {Are you ordering a CV Procedure (e.g. stress test, cath, DCCV, TEE, etc)?   Press F2        :[id number]}   Signed, Wyn Raddle, Jackee Shove, NP 04/30/2024, 12:35 PM [facility]

## 2024-05-01 ENCOUNTER — Encounter: Payer: Self-pay | Admitting: Nurse Practitioner

## 2024-05-02 ENCOUNTER — Ambulatory Visit: Admitting: Nurse Practitioner

## 2024-05-09 ENCOUNTER — Telehealth: Payer: Self-pay | Admitting: Nurse Practitioner

## 2024-05-09 MED ORDER — ISOSORB DINITRATE-HYDRALAZINE 20-37.5 MG PO TABS: 2.0000 | ORAL_TABLET | Freq: Three times a day (TID) | ORAL | 2 refills | Status: DC

## 2024-05-09 NOTE — Telephone Encounter (Signed)
*  STAT* If patient is at the pharmacy, call can be transferred to refill team.   1. Which medications need to be refilled? (please list name of each medication and dose if known)   isosorbide -hydrALAZINE  (BIDIL ) 20-37.5 MG tablet   2. Which pharmacy/location (including street and city if local pharmacy) is medication to be sent to? [id number] [place], [place] - [address] AT [facility] [facility] Phone: [phone]  Fax: [phone]     3. Do they need a 30 day or 90 day supply? 90 Have enough pills to last til Sun

## 2024-05-09 NOTE — Telephone Encounter (Signed)
 Pt's medication was sent to pt's pharmacy as requested. Confirmation received.

## 2024-05-13 ENCOUNTER — Telehealth: Payer: Self-pay | Admitting: Nurse Practitioner

## 2024-05-13 DIAGNOSIS — Z7689 Persons encountering health services in other specified circumstances: Secondary | ICD-10-CM

## 2024-05-13 NOTE — Telephone Encounter (Signed)
 Pt c/o medication issue:  1. Name of Medication:   isosorbide -hydrALAZINE  (BIDIL ) 20-37.5 MG tablet    2. How are you currently taking this medication (dosage and times per day)? As written  3. Are you having a reaction (difficulty breathing--STAT)? no  4. What is your medication issue? Pharmacy said his insurance [employer]) does not cover this medication anymore. Please advise

## 2024-05-13 NOTE — Telephone Encounter (Signed)
 Pt states he get a new insurance on Friday and can not get his medication until then. Agreed to see if social work could help.

## 2024-05-14 ENCOUNTER — Other Ambulatory Visit (HOSPITAL_COMMUNITY): Payer: Self-pay

## 2024-05-14 ENCOUNTER — Other Ambulatory Visit: Payer: Self-pay

## 2024-05-14 NOTE — Telephone Encounter (Signed)
 Pharmacy Patient Advocate Encounter  Insurance verification completed.    Ran test claim for BIDIL .   PER PLAN NON FORM, PRODUCT SERVICE NOT COVERED. PLAN BENEFIT EXCLUSION.   This test claim was processed through [facility]- copay amounts may vary at other pharmacies due to pharmacy/plan contracts, or as the patient moves through the different stages of their insurance plan.

## 2024-05-14 NOTE — Addendum Note (Signed)
 Addended by: Fin Hupp D on: 05/14/2024 04:41 PM   Modules accepted: Orders

## 2024-05-14 NOTE — Telephone Encounter (Signed)
 The last message said he had enough until Sunday. We can split the meds and send the hydralazine  and isosorbide  separate. I tried calling pt to discuss. Left VM for him to call back

## 2024-05-15 ENCOUNTER — Telehealth: Payer: Self-pay | Admitting: Licensed Clinical Social Worker

## 2024-05-15 ENCOUNTER — Other Ambulatory Visit (HOSPITAL_COMMUNITY): Payer: Self-pay

## 2024-05-15 MED ORDER — HYDRALAZINE HCL 50 MG PO TABS
50.0000 mg | ORAL_TABLET | Freq: Three times a day (TID) | ORAL | 3 refills | Status: DC
  Filled 2024-05-15 (×2): qty 90, 30d supply, fill #0

## 2024-05-15 MED ORDER — ISOSORBIDE DINITRATE 20 MG PO TABS
40.0000 mg | ORAL_TABLET | Freq: Three times a day (TID) | ORAL | 3 refills | Status: DC
  Filled 2024-05-15: qty 180, 30d supply, fill #0

## 2024-05-15 NOTE — Telephone Encounter (Signed)
 I spoke with patient.  Helped him get back into his MyChart account.  The issue is that we do not have his social so we have [phone] as his last 4.  We will change his BiDil  to hydralazine  and isosorbide  at least for now.  This should be relatively expensive for him as send at home pharmacy he can get the hydralazine  $15.  Patient to call if there is any issues.

## 2024-05-15 NOTE — Telephone Encounter (Signed)
 H&V Care Navigation CSW Progress Note  Clinical Social Worker contacted patient by phone to f/u on medication cost concerns. He is aware of arrangements made by pharmD. No additional questions, encouraged him to call as needed and provided phone # and hours for pharmacy.  Patient is participating in a Managed Medicaid Plan:  No, [employer]  SDOH Screenings   Food Insecurity: No Food Insecurity (11/02/2023)  Housing: Low Risk  (11/02/2023)  Transportation Needs: No Transportation Needs (11/02/2023)  Utilities: Not At Risk (11/02/2023)  Alcohol Screen: Low Risk  (01/26/2021)  Depression (PHQ2-9): Low Risk  (11/02/2023)  Financial Resource Strain: High Risk (05/02/2023)  Tobacco Use: Low Risk  (01/26/2024)    Todd Vega, MSW, LCSW Clinical Social Worker II [facility] Heart/Vascular Care Navigation  [phone]- work cell phone (preferred)

## 2024-05-15 NOTE — Addendum Note (Signed)
 Addended by: Maclaine Ahola D on: 05/15/2024 12:15 PM   Modules accepted: Orders

## 2024-05-16 ENCOUNTER — Other Ambulatory Visit: Payer: Self-pay

## 2024-05-16 ENCOUNTER — Ambulatory Visit: Admitting: Pharmacist

## 2024-05-20 NOTE — Telephone Encounter (Signed)
See encounter from 7/28

## 2024-05-22 ENCOUNTER — Other Ambulatory Visit (HOSPITAL_COMMUNITY): Payer: Self-pay

## 2024-06-03 ENCOUNTER — Other Ambulatory Visit: Payer: Self-pay | Admitting: Physician Assistant

## 2024-06-05 MED ORDER — ATORVASTATIN CALCIUM 80 MG PO TABS: 80.0000 mg | ORAL_TABLET | Freq: Every day | ORAL | 2 refills | Status: DC

## 2024-06-06 ENCOUNTER — Other Ambulatory Visit: Payer: Self-pay | Admitting: Physician Assistant

## 2024-06-12 ENCOUNTER — Telehealth: Payer: Self-pay | Admitting: Nurse Practitioner

## 2024-06-12 MED ORDER — ATORVASTATIN CALCIUM 80 MG PO TABS: 80.0000 mg | ORAL_TABLET | Freq: Every day | ORAL | 2 refills | Status: AC

## 2024-06-12 NOTE — Telephone Encounter (Signed)
*  STAT* If patient is at the pharmacy, call can be transferred to refill team.   1. Which medications need to be refilled? (please list name of each medication and dose if known)   atorvastatin  (LIPITOR) 80 MG tablet    2. Which pharmacy/location (including street and city if local pharmacy) is medication to be sent to?  [facility] #[id number] - [place], [place] - [address]      3. Do they need a 30 day or 90 day supply? 90 day    Pt is out of medication

## 2024-06-12 NOTE — Telephone Encounter (Signed)
 Pt's medication was sent to pt's pharmacy as requested. Confirmation received.

## 2024-06-18 ENCOUNTER — Telehealth: Payer: Self-pay | Admitting: Nurse Practitioner

## 2024-06-18 ENCOUNTER — Encounter (HOSPITAL_BASED_OUTPATIENT_CLINIC_OR_DEPARTMENT_OTHER): Payer: Self-pay

## 2024-06-18 MED ORDER — HYDRALAZINE HCL 50 MG PO TABS: 50.0000 mg | ORAL_TABLET | Freq: Three times a day (TID) | ORAL | 2 refills | Status: DC

## 2024-06-18 NOTE — Telephone Encounter (Signed)
*  STAT* If patient is at the pharmacy, call can be transferred to refill team.   1. Which medications need to be refilled? (please list name of each medication and dose if known) hydrALAZINE  (APRESOLINE ) 50 MG tablet [[id number]]  (pt is completely out)    2. Would you like to learn more about the convenience, safety, & potential cost savings by using the [facility]? Na    3. Are you open to using the [facility] (Type [facility]. NA    4. Which pharmacy/location (including street and city if local pharmacy) is medication to be sent to? [facility] on [address]    5. Do they need a 30 day or 90 day supply? 90

## 2024-06-18 NOTE — Telephone Encounter (Signed)
 RX sent in

## 2024-06-19 NOTE — Progress Notes (Unsigned)
 Cardiology Office Note    Patient Name: Todd Vega Date of Encounter: 06/19/2024  Primary Care Provider:  Celestia Rosaline SQUIBB, NP Primary Cardiologist:  Todd Passe, MD (Inactive) Primary Electrophysiologist: None   Past Medical History    Past Medical History:  Diagnosis Date   Asthma due to seasonal allergies     History of Present Illness  Todd Vega is a 63 y.o. male with a PMH of chronic combined CHF, HTN, LV mural thrombus (on Eliquis ), CAD s/p [facility] , CVA s/p 01/2021, HLD who presents today for 32-month follow-up.  Todd Vega was seen initially in 01/2021 for evaluation of CVA and CM with CHF.  He was noted to have a left atrial appendage thrombus was placed on Eliquis  and was started on GDMT prior to discharge.  He completed a R/LHC that showed multivessel CAD  with recommendation to pursue CABG however patient declined at that time.  He completed a cardiac MRI in 05/2021 that showed severe hypertrophy with no LV thrombus or late gadolinium enhancement.  He was seen in follow-up on 12/2021 and stopped Imdur  and was restarted on BiDil .  He was then seen on 05/02/2023 for follow-up reported no complaints of shortness of breath or chest pain.  He was found to have elevated BP secondary to not taking medications due to cost.  He was referred to social work for patient assistance.  He was last seen by Dr. Passe on 01/26/24 and was found to have elevated BP at that time.  During visit he reported compliance with medications however blood pressures remain elevated.  He was referred to  our heart care pharmacy for further assistance and medication titration.  He had BiDil  changed to hydralazine  and isosorbide  separate.  Todd Vega presents today for 22-month follow-up.  During today's visit his blood pressures were elevated with readings around 160/45 mmHg, despite being on multiple medications including Entresto , spironolactone , carvedilol , and atorvastatin . He has not taken hydralazine   since the weekend due to a pharmacy issue and is out of Isordil . He has been experiencing issues with his insurance, which recently switched to [employer] and [employer], affecting his medication refills. He is in contact with the pharmacy to resolve these issues. He mentions that his blood pressure cuff is new, and he has been monitoring his blood pressure at home. No chest pain, shortness of breath, or leg swelling. He is able to sleep flat without difficulty. He mentions a past stroke but did not experience symptoms like headaches prior to the event. He is a Investment banker, operational and works full-time, incorporating walking and light weight lifting into his routine. He is mindful of his salt intake due to his profession and family history of high blood pressure on both sides. He does not have children but is aware of his family's medical history and ensures his siblings are informed. Patient denies chest pain, palpitations, dyspnea, PND, orthopnea, nausea, vomiting, dizziness, syncope, edema, weight gain, or early satiety.  Discussed the use of AI scribe software for clinical note transcription with the patient, who gave verbal consent to proceed.  History of Present Illness   Review of Systems  Please see the history of present illness.    All other systems reviewed and are otherwise negative except as noted above.  Physical Exam    Wt Readings from Last 3 Encounters:  01/26/24 129 lb 12.8 oz (58.9 kg)  11/02/23 121 lb (54.9 kg)  05/02/23 126 lb (57.2 kg)   CD:Uyzmz were no vitals filed  for this visit.,There is no height or weight on file to calculate BMI. GEN: Well nourished, well developed in no acute distress Neck: No JVD; No carotid bruits Pulmonary: Clear to auscultation without rales, wheezing or rhonchi  Cardiovascular: Normal rate. Regular rhythm. Normal S1. Normal S2.   Murmurs: There is no murmur.  ABDOMEN: Soft, non-tender, non-distended EXTREMITIES:  No edema; No deformity   EKG/LABS/ Recent  Cardiac Studies   ECG personally reviewed by me today -sinus rhythm with left axis deviation and rate of 81 bpm with no acute changes consistent with previous EKG.  Risk Assessment/Calculations:    Lab Results  Component Value Date   WBC 4.3 11/02/2023   HGB 10.2 (L) 11/02/2023   HCT 32.6 (L) 11/02/2023   MCV 79 11/02/2023   PLT 322 11/02/2023   Lab Results  Component Value Date   CREATININE 1.02 11/02/2023   BUN 18 11/02/2023   NA 137 11/02/2023   K 3.9 11/02/2023   CL 98 11/02/2023   CO2 24 11/02/2023   Lab Results  Component Value Date   CHOL 129 11/02/2023   HDL 70 11/02/2023   LDLCALC 47 11/02/2023   TRIG 52 11/02/2023   CHOLHDL 1.8 11/02/2023    Lab Results  Component Value Date   HGBA1C 5.5 01/06/2022   Assessment & Plan    Assessment and Plan Assessment & Plan 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 EKG unchanged. Blood pressure management crucial to prevent exacerbation. - Discontinue separate hydralazine  and Isordil . - Prescribe combination pill of hydralazine  and Isordil . - Refer to clinical pharmacist for medication review and blood pressure management.  Hypertension Blood pressure elevated at 160/45 at home, 152/88 in office. Control essential to reduce cardiac workload and prevent heart failure worsening. Current regimen requires adjustment to achieve target 130 systolic. - Increase hydralazine  component in combination pill to 20/50. - Refer to clinical pharmacist for blood pressure management. - Recheck blood pressure before end of visit.  Chronic anemia Chronic anemia noted in previous labs, no new symptoms. Possible genetic or other underlying causes. - Order lab tests to check blood counts and kidney function.  1.  Essential hypertension: HYPERTENSION CONTROL Vitals:   06/20/24 0753 06/20/24 0824  BP: (!) 170/100 (!) 152/88    The patient's blood pressure is  elevated above target today.  In order to address the patient's elevated BP:     -Discontinue hydralazine  50 mg 3 times daily and Isordil  20 mg -We will start BiDil  per patient request 20-37.5 mg 3 times daily - Refer to clinical pharmacist for blood pressure management. - Patient advised to monitor BP at home and bring readings to pharmacy follow-up.  2.  CAD: -Previous LHC completed in 2022 with multivessel disease and indications for bypass however patient declined at that time. - Patient reports no chest pain or angina since previous follow-up. - Continue current GDMT with ASA 81 mg, carvedilol  25 mg twice daily, ezetimibe  10 mg, Lipitor 80 mg - We will check CBC today  3.  Chronic combined CHF: - Patient is euvolemic on exam and denies any CHF symptoms such as shortness of breath or lower extremity swelling - Continue current GDMT with carvedilol  25 mg twice daily, [name]  10 mg daily, Entresto  97/103 mg twice daily, spironolactone  50 mg and initiation of Isordil  20-37.5 3 times daily - We will repeat 2D echo to evaluate LV function -Low sodium diet, fluid restriction <2L, and daily weights  encouraged. Educated to contact our office for weight gain of 2 lbs overnight or 5 lbs in one week.   4.  Hyperlipidemia: - Patient's last LDL cholesterol was 47 at goal - Continue ezetimibe  10 mg and Lipitor 80 mg daily  5.  History of CVA: - Patient reports no residual deficits or side effect since previous follow-up. - Continue ASA 81 mg and Eliquis  5 mg twice daily - We will check BMET today  Disposition: Follow-up with new cardiologist or APP in 6 months    Signed, Todd Vega, Todd Shove, NP 06/19/2024, 12:12 PM [facility]

## 2024-06-20 ENCOUNTER — Ambulatory Visit: Attending: Nurse Practitioner | Admitting: Nurse Practitioner

## 2024-06-20 ENCOUNTER — Encounter: Payer: Self-pay | Admitting: Nurse Practitioner

## 2024-06-20 VITALS — BP 152/88 | HR 81 | Ht 65.0 in | Wt 127.2 lb

## 2024-06-20 DIAGNOSIS — I639 Cerebral infarction, unspecified: Secondary | ICD-10-CM

## 2024-06-20 DIAGNOSIS — I10 Essential (primary) hypertension: Secondary | ICD-10-CM

## 2024-06-20 DIAGNOSIS — I5040 Unspecified combined systolic (congestive) and diastolic (congestive) heart failure: Secondary | ICD-10-CM | POA: Diagnosis not present

## 2024-06-20 DIAGNOSIS — I2510 Atherosclerotic heart disease of native coronary artery without angina pectoris: Secondary | ICD-10-CM

## 2024-06-20 DIAGNOSIS — E785 Hyperlipidemia, unspecified: Secondary | ICD-10-CM

## 2024-06-20 LAB — CBC

## 2024-06-20 NOTE — Patient Instructions (Signed)
 Medication Instructions:  STOP Isosorbide  and Hydralazine  START Bidil  (Isosorbide -Hydralazine ) 20-37.5mg  Take 1 tablet three times a day  *If you need a refill on your cardiac medications before your next appointment, please call your pharmacy*  Lab Work: TODAY-BMET &CBC If you have labs (blood work) drawn today and your tests are completely normal, you will receive your results only by: MyChart Message (if you have MyChart) OR A paper copy in the mail If you have any lab test that is abnormal or we need to change your treatment, we will call you to review the results.  Testing/Procedures: Your physician has requested that you have an echocardiogram. Echocardiography is a painless test that uses sound waves to create images of your heart. It provides your doctor with information about the size and shape of your heart and how well your heart's chambers and valves are working. This procedure takes approximately one hour. There are no restrictions for this procedure. Please do NOT wear cologne, perfume, aftershave, or lotions (deodorant is allowed). Please arrive 15 minutes prior to your appointment time.  Please note: We ask at that you not bring children with you during ultrasound (echo/ vascular) testing. Due to room size and safety concerns, children are not allowed in the ultrasound rooms during exams. Our front office staff cannot provide observation of children in our lobby area while testing is being conducted. An adult accompanying a patient to their appointment will only be allowed in the ultrasound room at the discretion of the ultrasound technician under special circumstances. We apologize for any inconvenience.  Follow-Up: At [facility], you and your health needs are our priority.  As part of our continuing mission to provide you with exceptional heart care, our providers are all part of one team.  This team includes your primary Cardiologist (physician) and Advanced Practice  Providers or APPs (Physician Assistants and Nurse Practitioners) who all work together to provide you with the care you need, when you need it.  Your next appointment:   6 month(s)  Provider:   Georganna Archer MD or Joelle Cedars, MD    NEEDS APPT WITH PHARM-D TO DISCUSS BLOOD PRESSURE  We recommend signing up for the patient portal called MyChart.  Sign up information is provided on this After Visit Summary.  MyChart is used to connect with patients for Virtual Visits (Telemedicine).  Patients are able to view lab/test results, encounter notes, upcoming appointments, etc.  Non-urgent messages can be sent to your provider as well.   To learn more about what you can do with MyChart, go to [email or web address].   Other Instructions

## 2024-06-21 ENCOUNTER — Other Ambulatory Visit (HOSPITAL_COMMUNITY): Payer: Self-pay

## 2024-06-21 ENCOUNTER — Telehealth: Payer: Self-pay | Admitting: Nurse Practitioner

## 2024-06-21 ENCOUNTER — Telehealth: Payer: Self-pay | Admitting: Pharmacy Technician

## 2024-06-21 ENCOUNTER — Ambulatory Visit: Payer: Self-pay | Admitting: Nurse Practitioner

## 2024-06-21 ENCOUNTER — Other Ambulatory Visit: Payer: Self-pay

## 2024-06-21 ENCOUNTER — Other Ambulatory Visit (HOSPITAL_BASED_OUTPATIENT_CLINIC_OR_DEPARTMENT_OTHER): Payer: Self-pay

## 2024-06-21 DIAGNOSIS — I5040 Unspecified combined systolic (congestive) and diastolic (congestive) heart failure: Secondary | ICD-10-CM

## 2024-06-21 DIAGNOSIS — I10 Essential (primary) hypertension: Secondary | ICD-10-CM

## 2024-06-21 DIAGNOSIS — I2510 Atherosclerotic heart disease of native coronary artery without angina pectoris: Secondary | ICD-10-CM

## 2024-06-21 LAB — BASIC METABOLIC PANEL WITH GFR
BUN/Creatinine Ratio: 19 (ref 10–24)
BUN: 19 mg/dL (ref 8–27)
CO2: 20 mmol/L (ref 20–29)
Calcium: 9.1 mg/dL (ref 8.6–10.2)
Chloride: 98 mmol/L (ref 96–106)
Creatinine, Ser: 1 mg/dL (ref 0.76–1.27)
Glucose: 79 mg/dL (ref 70–99)
Potassium: 3.3 mmol/L — ABNORMAL LOW (ref 3.5–5.2)
Sodium: 136 mmol/L (ref 134–144)
eGFR: 85 mL/min/1.73 (ref >59)

## 2024-06-21 LAB — CBC
Hematocrit: 39.3 (ref 37.5–51.0)
Hemoglobin: 11.8 g/dL — AB (ref 13.0–17.7)
MCH: 24.5 pg — AB (ref 26.6–33.0)
MCHC: 30 g/dL — AB (ref 31.5–35.7)
MCV: 82 fL (ref 79–97)
Platelets: 220 x10E3/uL (ref 150–450)
RBC: 4.82 x10E6/uL (ref 4.14–5.80)
RDW: 14.8 (ref 11.6–15.4)
WBC: 4.7 x10E3/uL (ref 3.4–10.8)

## 2024-06-21 MED ORDER — POTASSIUM CHLORIDE CRYS ER 20 MEQ PO TBCR
20.0000 meq | EXTENDED_RELEASE_TABLET | Freq: Every day | ORAL | 0 refills | Status: DC
  Filled 2024-06-21: qty 90, 90d supply, fill #0

## 2024-06-21 MED ORDER — HYDRALAZINE HCL 50 MG PO TABS
50.0000 mg | ORAL_TABLET | Freq: Three times a day (TID) | ORAL | 3 refills | Status: DC
  Filled 2024-06-21 – 2024-06-24 (×2): qty 90, 30d supply, fill #0

## 2024-06-21 MED ORDER — ISOSORBIDE DINITRATE 40 MG PO TABS
40.0000 mg | ORAL_TABLET | Freq: Three times a day (TID) | ORAL | 1 refills | Status: DC
  Filled 2024-06-21: qty 90, 30d supply, fill #0

## 2024-06-21 MED ORDER — POTASSIUM CHLORIDE CRYS ER 20 MEQ PO TBCR
EXTENDED_RELEASE_TABLET | ORAL | 0 refills | Status: AC
  Filled 2024-06-21: qty 90, 34d supply, fill #0
  Filled 2024-06-21 – 2024-06-24 (×2): qty 90, 86d supply, fill #0

## 2024-06-21 MED ORDER — ISOSORBIDE DINITRATE 20 MG PO TABS
40.0000 mg | ORAL_TABLET | Freq: Three times a day (TID) | ORAL | 1 refills | Status: DC
  Filled 2024-06-21 – 2024-06-24 (×3): qty 180, 30d supply, fill #0

## 2024-06-21 MED ORDER — ISOSORB DINITRATE-HYDRALAZINE 20-37.5 MG PO TABS
1.0000 | ORAL_TABLET | Freq: Three times a day (TID) | ORAL | 1 refills | Status: DC
  Filled 2024-06-21: qty 90, 30d supply, fill #0

## 2024-06-21 NOTE — Telephone Encounter (Signed)
 Bidil  was sent in. Received a message from the pharmacist that states the rx is not covered by the patients insurance. It will cost the patient $120.87 for combo drug.  Per Jackee change combo drug to individual: Isodil 40mg  tablet tid and Hydralazine  50mg  tid  Spoke with the patient and made him aware. He voiced understanding.

## 2024-06-21 NOTE — Addendum Note (Signed)
 Addended by: SEBASTIAN JANESE GRADE on: 06/21/2024 03:58 PM   Modules accepted: Orders

## 2024-06-21 NOTE — Addendum Note (Signed)
 Addended by: SEBASTIAN JANESE GRADE on: 06/21/2024 04:59 PM   Modules accepted: Orders

## 2024-06-21 NOTE — Telephone Encounter (Signed)
 Pharmacy Patient Advocate Encounter   Received notification from Patient Pharmacy that prior authorization for Bidil  is required/requested.   Insurance verification completed.   The patient is insured through [facility] MEDICAID / [employer].   Per test claim: PA required; PA submitted to above mentioned insurance via Latent Key/confirmation #/EOC [id number] Status is pending

## 2024-06-21 NOTE — Telephone Encounter (Signed)
 Left message for patient to call back

## 2024-06-21 NOTE — Telephone Encounter (Signed)
 Cancelled- changed med to separate items

## 2024-06-21 NOTE — Telephone Encounter (Signed)
  Pt c/o medication issue:  1. Name of Medication: BiDil    2. How are you currently taking this medication (dosage and times per day)?   3. Are you having a reaction (difficulty breathing--STAT)? No   4. What is your medication issue? Patient is following up this medication if it has send to his pharmacy at [facility]

## 2024-06-21 NOTE — Telephone Encounter (Signed)
 See result note other phone call from today 06/21/24. Patient has already talked to someone.

## 2024-06-24 ENCOUNTER — Encounter: Payer: Self-pay | Admitting: Pharmacist

## 2024-06-24 ENCOUNTER — Other Ambulatory Visit: Payer: Self-pay

## 2024-06-24 ENCOUNTER — Other Ambulatory Visit (HOSPITAL_COMMUNITY): Payer: Self-pay

## 2024-06-25 ENCOUNTER — Other Ambulatory Visit: Payer: Self-pay

## 2024-06-27 ENCOUNTER — Other Ambulatory Visit (HOSPITAL_COMMUNITY): Payer: Self-pay

## 2024-06-27 ENCOUNTER — Ambulatory Visit: Attending: Cardiology | Admitting: Pharmacist

## 2024-06-27 NOTE — Progress Notes (Deleted)
 Patient ID: Todd Vega                 DOB: Aug 19, 1961                      MRN: [id number]     HPI: Todd Vega is a 63 y.o. male referred by Todd Ek NP to pharmacy clinic for HF medication management. PMH is significant for CVA, CHF, HTN, CAD, and history of atrial appendage thrombus. Most recent LVEF 40-45% on 05/20/21.  Scheduled for repeat echo on 07/18/24.  Discussion with patient today included the following: cardiac medication indications, review of GDMT including reasoning behind medication titration, importance of medication adherence, and patient engagement. ***Denies dizziness, fatigue, LE edema, or SOB. Able to complete all ADLs. *** checks daily weights at home. *** adheres to a low-sodium diet.  Current CHF meds:  Carvedilol  25mg  daily Hydralazine  50mg  TID Isosorbide  dinitrate 40mg  TID Entresto  97-103 mg BID  Previously tried:  BP goal:   Family History:   Social History:   Diet:   Exercise:   Home BP readings:   Wt Readings from Last 3 Encounters:  06/20/24 127 lb 3.2 oz (57.7 kg)  01/26/24 129 lb 12.8 oz (58.9 kg)  11/02/23 121 lb (54.9 kg)   BP Readings from Last 3 Encounters:  06/20/24 (!) 152/88  01/26/24 (!) 158/95  11/02/23 (!) 95/58   Pulse Readings from Last 3 Encounters:  06/20/24 81  01/26/24 79  11/02/23 86    Renal function: Estimated Creatinine Clearance: 61.7 mL/min (by C-G formula based on SCr of 1 mg/dL).  Past Medical History:  Diagnosis Date   Asthma due to seasonal allergies     Current Outpatient Medications on File Prior to Visit  Medication Sig Dispense Refill   aspirin  81 MG EC tablet Take 1 tablet (81 mg total) by mouth daily. Swallow whole. 30 tablet 0   atorvastatin  (LIPITOR) 80 MG tablet Take 1 tablet (80 mg total) by mouth daily. 90 tablet 2   carvedilol  (COREG ) 25 MG tablet Take 1 tablet (25 mg total) by mouth 2 (two) times daily with a meal. 180 tablet 1   dapagliflozin  propanediol ([name] ) 10 MG TABS tablet  Take 1 tablet (10 mg total) by mouth daily before breakfast. 90 tablet 3   doxepin (SINEQUAN) 10 MG capsule Take 10 mg by mouth 2 (two) times daily.     ELIQUIS  5 MG TABS tablet TAKE 1 TABLET(5 MG) BY MOUTH TWICE DAILY 180 tablet 0   ezetimibe  (ZETIA ) 10 MG tablet Take 1 tablet (10 mg total) by mouth daily. 90 tablet 1   fexofenadine (ALLEGRA) 180 MG tablet Take 180 mg by mouth as needed for allergies or rhinitis.     hydrALAZINE  (APRESOLINE ) 50 MG tablet Take 1 tablet (50 mg total) by mouth 3 (three) times daily. 90 tablet 3   isosorbide  dinitrate (ISORDIL ) 20 MG tablet Take 2 tablets (40 mg total) by mouth 3 (three) times daily. 180 tablet 1   potassium chloride  SA (KLOR-CON  M20) 20 MEQ tablet Take 1 tablet (20 mEq total) by mouth 2 (two) times daily for 4 days, THEN 1 tablet (20 mEq total) daily. 90 tablet 0   sacubitril -valsartan  (ENTRESTO ) 97-103 MG Take 1 tablet by mouth 2 (two) times daily. 180 tablet 2   spironolactone  (ALDACTONE ) 50 MG tablet Take 1 tablet (50 mg total) by mouth daily. 90 tablet 1   No current facility-administered medications on file prior to visit.  Allergies  Allergen Reactions   Chocolate    Coconut Oil Hives   Cocos Nucifera      Assessment/Plan:  1. CHF -

## 2024-07-18 ENCOUNTER — Encounter (HOSPITAL_COMMUNITY): Payer: Self-pay | Admitting: Nurse Practitioner

## 2024-07-18 ENCOUNTER — Ambulatory Visit (HOSPITAL_COMMUNITY)
Admission: RE | Admit: 2024-07-18 | Payer: Self-pay | Source: Ambulatory Visit | Attending: Nurse Practitioner | Admitting: Nurse Practitioner

## 2024-08-05 ENCOUNTER — Other Ambulatory Visit: Payer: Self-pay | Admitting: Nurse Practitioner

## 2024-08-06 ENCOUNTER — Other Ambulatory Visit: Payer: Self-pay | Admitting: Nurse Practitioner

## 2024-08-08 MED ORDER — CARVEDILOL 25 MG PO TABS: 25.0000 mg | ORAL_TABLET | Freq: Two times a day (BID) | ORAL | 3 refills | Status: DC

## 2024-08-08 MED ORDER — EZETIMIBE 10 MG PO TABS: 10.0000 mg | ORAL_TABLET | Freq: Every day | ORAL | 3 refills | Status: AC

## 2024-08-15 ENCOUNTER — Telehealth: Payer: Self-pay | Admitting: Cardiovascular Disease

## 2024-08-15 MED ORDER — ISOSORBIDE DINITRATE 20 MG PO TABS: 40.0000 mg | ORAL_TABLET | Freq: Three times a day (TID) | ORAL | 11 refills | Status: DC

## 2024-08-15 NOTE — Telephone Encounter (Signed)
*  STAT* If patient is at the pharmacy, call can be transferred to refill team.   1. Which medications need to be refilled? (please list name of each medication and dose if known) isosorbide  dinitrate (ISORDIL ) 20 MG tablet   2. Which pharmacy/location (including street and city if local pharmacy) is medication to be sent to?  [id number] - [place], [place] - [address]    3. Do they need a 30 day or 90 day supply? 90

## 2024-08-15 NOTE — Telephone Encounter (Signed)
 Pt's medication was sent to pt's pharmacy as requested. Confirmation received.

## 2024-08-29 ENCOUNTER — Telehealth: Payer: Self-pay | Admitting: Cardiovascular Disease

## 2024-08-29 ENCOUNTER — Encounter (HOSPITAL_COMMUNITY): Payer: Self-pay

## 2024-08-29 ENCOUNTER — Ambulatory Visit (HOSPITAL_COMMUNITY): Payer: Self-pay

## 2024-08-29 NOTE — Telephone Encounter (Signed)
 I spoke with patient.  He reports he was unable to have echo done recently as his insurance was out of network.  He wanted to make MD aware why he did not have it done.  He is going to try and get [employer] through his employer.  Patient was previously followed by Dr Alveta and was to establish with new cardiologist in March.  Appointment made for patient to see Dr Floretta on December 19, 2024  Will also send to billing to see if [facility] in network.

## 2024-08-29 NOTE — Telephone Encounter (Signed)
 Patient calling to say he wasn't able to get his echo done because of his insurance. Wanted to make the dr aware. Please advise

## 2024-08-30 ENCOUNTER — Telehealth: Payer: Self-pay | Admitting: Cardiovascular Disease

## 2024-08-30 MED ORDER — CARVEDILOL 25 MG PO TABS: 25.0000 mg | ORAL_TABLET | Freq: Two times a day (BID) | ORAL | 3 refills | Status: AC

## 2024-08-30 NOTE — Telephone Encounter (Signed)
 Prescription sent

## 2024-08-30 NOTE — Telephone Encounter (Signed)
*  STAT* If patient is at the pharmacy, call can be transferred to refill team.   1. Which medications need to be refilled? (please list name of each medication and dose if known) carvedilol  (COREG ) 25 MG tablet   2. Which pharmacy/location (including street and city if local pharmacy) is medication to be sent to? [id number] - [place], [place] - [address]   3. Do they need a 30 day or 90 day supply? 90

## 2024-09-02 NOTE — Telephone Encounter (Signed)
 Per DPR it is OK to leave a detailed message.  Left message that  office is in network with [employer] and to call office if any questions.

## 2024-09-28 ENCOUNTER — Other Ambulatory Visit: Payer: Self-pay | Admitting: Nurse Practitioner

## 2024-10-03 ENCOUNTER — Telehealth: Payer: Self-pay | Admitting: Cardiovascular Disease

## 2024-10-03 DIAGNOSIS — I5040 Unspecified combined systolic (congestive) and diastolic (congestive) heart failure: Secondary | ICD-10-CM

## 2024-10-03 MED ORDER — SACUBITRIL-VALSARTAN 97-103 MG PO TABS: 1.0000 | ORAL_TABLET | Freq: Two times a day (BID) | ORAL | 0 refills | Status: AC

## 2024-10-03 NOTE — Telephone Encounter (Signed)
 Refill sent

## 2024-10-03 NOTE — Telephone Encounter (Signed)
°*  STAT* If patient is at the pharmacy, call can be transferred to refill team.   1. Which medications need to be refilled? (please list name of each medication and dose if known)   sacubitril -valsartan  (ENTRESTO ) 97-103 MG     2. Would you like to learn more about the convenience, safety, & potential cost savings by using the [facility]? No    3. Are you open to using the [facility] ([facility]. No    4. Which pharmacy/location (including street and city if local pharmacy) is medication to be sent to?[facility] #[id number] - [place], [place] - [address]    5. Do they need a 30 day or 90 day supply? 90 day   Pt is out of medication.

## 2024-10-27 ENCOUNTER — Ambulatory Visit: Admission: EM | Admit: 2024-10-27 | Discharge: 2024-10-27 | Disposition: A | Discharge status: Home / Self Care

## 2024-10-27 ENCOUNTER — Encounter: Payer: Self-pay | Admitting: Emergency Medicine

## 2024-10-27 DIAGNOSIS — K529 Noninfective gastroenteritis and colitis, unspecified: Secondary | ICD-10-CM

## 2024-10-27 MED ORDER — ACETAMINOPHEN 325 MG PO TABS
650.0000 mg | ORAL_TABLET | Freq: Once | ORAL | Status: AC
  Administered 2024-10-27: 650 mg via ORAL

## 2024-10-27 NOTE — ED Triage Notes (Addendum)
 Pt reports diarrhea episodes that began in the evening on 1/8. States he ate a meal at [employer] and his stomach has been upset ever since. Denies N/V and fevers. Cramping abdominal pain started on 1/8. 3 episodes of watery stools over the last 24hrs. No med use for symptoms. Only ate soup yesterday due to stomach pain.

## 2024-10-27 NOTE — Discharge Instructions (Signed)

## 2024-10-27 NOTE — ED Provider Notes (Signed)
 " [facility]    CSN: [id number] Arrival date & time: 10/27/24  0807      History   Chief Complaint Chief Complaint  Patient presents with   Diarrhea    HPI Todd Vega is a 64 y.o. male.   Pt presents today due to diarrhea since 1/9 that started about 12 hrs after eating a meal from [employer]. Pt states that he has been experiencing 3-4 episodes daily. Pt states that he has been experiencing abdominal pain and cramping. Pt denies nausea or vomiting but does admit to some weakness. Pt states that he has been drinking water and soup but is scared to eat anything more due excessive loose stool.   The history is provided by the patient.  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/17/2022   Postherpetic neuralgia 02/17/2022   Chronic combined systolic and diastolic CHF (congestive heart failure) (HCC) 12/30/2021   CAD (coronary artery disease) 07/09/2021   Depression 05/25/2021   Generalized anxiety disorder 05/25/2021   Combined systolic and diastolic heart failure (HCC) 01/26/2021   Hyperlipidemia 01/26/2021   Hyponatremia 01/26/2021   Alcohol abuse 01/26/2021   Hypomagnesemia 01/26/2021   Acute CVA (cerebrovascular accident) (HCC) 01/23/2021   HTN (hypertension) 01/23/2021   CVA (cerebral vascular accident) (HCC) 01/23/2021    Past Surgical History:  Procedure Laterality Date   BUBBLE STUDY  01/25/2021   Procedure: BUBBLE STUDY;  Surgeon: Lonni Slain, MD;  Location: [facility] ENDOSCOPY;  Service: Cardiovascular;;   RIGHT HEART CATH AND CORONARY ANGIOGRAPHY N/A 01/27/2021   Procedure: RIGHT HEART CATH AND CORONARY ANGIOGRAPHY;  Surgeon: Claudene Victory ORN, MD;  Location: [facility] INVASIVE CV LAB;  Service: Cardiovascular;  Laterality: N/A;   TEE WITHOUT CARDIOVERSION N/A 01/25/2021   Procedure:  TRANSESOPHAGEAL ECHOCARDIOGRAM (TEE);  Surgeon: Lonni Slain, MD;  Location: [facility] ENDOSCOPY;  Service: Cardiovascular;  Laterality: N/A;       Home Medications    Prior to Admission medications  Medication Sig Start Date End Date Taking? Authorizing Provider  aspirin  81 MG EC tablet Take 1 tablet (81 mg total) by mouth daily. Swallow whole. 01/28/21  Yes Danford, Lonni SQUIBB, MD  atorvastatin  (LIPITOR) 80 MG tablet Take 1 tablet (80 mg total) by mouth daily. 06/12/24  Yes Lucien, Tessa N, PA-C  carvedilol  (COREG ) 25 MG tablet Take 1 tablet (25 mg total) by mouth 2 (two) times daily with a meal. Please keep Cardiology appointment for March 2026 for further refills. Thank You! 08/30/24  Yes Wyn Jackee VEAR Mickey., NP  dapagliflozin  propanediol ([name] ) 10 MG TABS tablet Take 1 tablet (10 mg total) by mouth daily before breakfast. 03/22/24  Yes Nahser, Aleene PARAS, MD  doxepin (SINEQUAN) 10 MG capsule Take 10 mg by mouth 2 (two) times daily. 10/25/23  Yes [provider]  ELIQUIS  5 MG TABS tablet TAKE 1 TABLET(5 MG) BY MOUTH TWICE DAILY 03/07/24  Yes Nahser, Aleene PARAS, MD  ezetimibe  (ZETIA ) 10 MG tablet Take 1 tablet (10 mg total) by mouth daily. 08/08/24  Yes Conte, Tessa N, PA-C  fexofenadine (ALLEGRA) 180 MG tablet Take 180 mg by mouth as needed for allergies or rhinitis.   Yes [provider]  hydrALAZINE  (APRESOLINE ) 50 MG tablet TAKE 1 TABLET(50 MG) BY MOUTH THREE TIMES DAILY 10/01/24  Yes Wyn Jackee VEAR Mickey., NP  isosorbide  dinitrate (ISORDIL ) 20 MG tablet Take 2 tablets (40 mg total) by mouth 3 (three) times daily. 08/15/24  Yes Conte, Tessa N, PA-C  sacubitril -valsartan  (ENTRESTO ) 97-103 MG Take 1 tablet by mouth 2 (two) times daily. 10/03/24  Yes Floretta Mallard, MD  spironolactone  (ALDACTONE ) 50 MG tablet Take 1 tablet (50 mg total) by mouth daily. 10/06/23  Yes Nahser, Aleene PARAS, MD  potassium chloride  SA (KLOR-CON  M20) 20 MEQ tablet Take 1 tablet (20 mEq total) by mouth 2  (two) times daily for 4 days, THEN 1 tablet (20 mEq total) daily. Patient not taking: Reported on 10/27/2024 06/21/24 09/21/24  Wyn Jackee VEAR Mickey., NP    Family History History reviewed. No pertinent family history.  Social History Social History[1]   Allergies   Chocolate, Coconut oil, and Cocos nucifera   Review of Systems Review of Systems  Gastrointestinal:  Positive for diarrhea.     Physical Exam Triage Vital Signs ED Triage Vitals  Encounter Vitals Group     BP 10/27/24 0835 (!) 90/52     Girls Systolic BP Percentile --      Girls Diastolic BP Percentile --      Boys Systolic BP Percentile --      Boys Diastolic BP Percentile --      Pulse Rate 10/27/24 0835 87     Resp 10/27/24 0835 14     Temp 10/27/24 0835 98.8 F (37.1 C)     Temp Source 10/27/24 0835 Oral     SpO2 10/27/24 0835 94 %     Weight --      Height --      Head Circumference --      Peak Flow --      Pain Score 10/27/24 0836 7     Pain Loc --      Pain Education --      Exclude from Growth Chart --    No data found.  Updated Vital Signs BP 110/65 (BP Location: Left Arm)   Pulse 76   Temp 98.8 F (37.1 C) (Oral)   Resp 14   SpO2 94%   Visual Acuity Right Eye Distance:   Left Eye Distance:   Bilateral Distance:    Right Eye Near:   Left Eye Near:    Bilateral Near:     Physical Exam Vitals and nursing note reviewed.  Constitutional:      General: He is not in acute distress.    Appearance: Normal appearance. He is not ill-appearing, toxic-appearing or diaphoretic.  Eyes:     General: No scleral icterus. Cardiovascular:     Rate and Rhythm: Normal rate and regular rhythm.     Heart sounds: Normal heart sounds.  Pulmonary:     Effort: Pulmonary effort is normal. No respiratory distress.     Breath sounds: Normal breath sounds. No wheezing or rhonchi.  Skin:    General: Skin is warm.  Neurological:     Mental Status: He is alert and oriented to person, place, and time.   Psychiatric:        Mood and Affect: Mood normal.        Behavior: Behavior normal.      UC Treatments / Results  Labs (all labs ordered are listed, but only abnormal results are displayed) Labs Reviewed - No data to display  EKG   Radiology No results found.  Procedures Procedures (including critical care time)  Medications Ordered in UC Medications  acetaminophen  (TYLENOL ) tablet  650 mg (650 mg Oral Given 10/27/24 0843)    Initial Impression / Assessment and Plan / UC Course  I have reviewed the triage vital signs and the nursing notes.  Pertinent labs & imaging results that were available during my care of the patient were reviewed by me and considered in my medical decision making (see chart for details).    Final Clinical Impressions(s) / UC Diagnoses   Final diagnoses:  None   Discharge Instructions   None    ED Prescriptions   None    PDMP not reviewed this encounter.    [1]  Social History Tobacco Use   Smoking status: Never   Smokeless tobacco: Never  Vaping Use   Vaping status: Never Used  Substance Use Topics   Alcohol use: Yes    Alcohol/week: 6.0 standard drinks of alcohol    Types: 6 Cans of beer per week   Drug use: Never     Andra Corean BROCKS, PA-C 10/27/24 [id number]  "

## 2024-10-29 ENCOUNTER — Telehealth (INDEPENDENT_AMBULATORY_CARE_PROVIDER_SITE_OTHER): Payer: Self-pay | Admitting: Primary Care

## 2024-10-29 NOTE — Telephone Encounter (Signed)
 Left VM with pt about their upcoming appt. Pt did not answer

## 2024-10-30 ENCOUNTER — Ambulatory Visit (INDEPENDENT_AMBULATORY_CARE_PROVIDER_SITE_OTHER): Payer: Self-pay | Admitting: Primary Care

## 2024-11-06 ENCOUNTER — Telehealth: Payer: Self-pay | Admitting: Student in an Organized Health Care Education/Training Program

## 2024-11-06 NOTE — Telephone Encounter (Signed)
" °*  STAT* If patient is at the pharmacy, call can be transferred to refill team.   1. Which medications need to be refilled? (please list name of each medication and dose if known)  isosorbide  dinitrate (ISORDIL ) 20 MG tablet    2. Would you like to learn more about the convenience, safety, & potential cost savings by using the [facility]?    3. Are you open to using the [facility] ([facility].    4. Which pharmacy/location (including street and city if local pharmacy) is medication to be sent to?  [facility] #[id number] - [place], [place] - [address]    5. Do they need a 30 day or 90 day supply? 90  "

## 2024-11-12 MED ORDER — ISOSORBIDE DINITRATE 20 MG PO TABS: 40.0000 mg | ORAL_TABLET | Freq: Three times a day (TID) | ORAL | 1 refills | Status: AC

## 2024-11-12 NOTE — Telephone Encounter (Signed)
 Refill sent

## 2024-11-13 ENCOUNTER — Other Ambulatory Visit: Payer: Self-pay | Admitting: Physician Assistant

## 2024-11-18 MED ORDER — SPIRONOLACTONE 50 MG PO TABS: 50.0000 mg | ORAL_TABLET | Freq: Every day | ORAL | 1 refills | Status: AC

## 2024-12-17 ENCOUNTER — Ambulatory Visit: Payer: Self-pay | Admitting: Student in an Organized Health Care Education/Training Program

## 2024-12-19 ENCOUNTER — Ambulatory Visit: Payer: Self-pay | Admitting: Student in an Organized Health Care Education/Training Program
